# Patient Record
Sex: Female | Born: 1945 | Race: White | Hispanic: No | Smoking: Never smoker
Health system: Southern US, Community
[De-identification: ages and names within clinical notes are randomized; demographics above are authoritative.]

## PROBLEM LIST (undated history)

## (undated) DIAGNOSIS — Z9889 Other specified postprocedural states: Secondary | ICD-10-CM

## (undated) DIAGNOSIS — K228 Other specified diseases of esophagus: Secondary | ICD-10-CM

## (undated) DIAGNOSIS — M797 Fibromyalgia: Secondary | ICD-10-CM

## (undated) DIAGNOSIS — R05 Cough: Secondary | ICD-10-CM

## (undated) DIAGNOSIS — R011 Cardiac murmur, unspecified: Secondary | ICD-10-CM

## (undated) DIAGNOSIS — K219 Gastro-esophageal reflux disease without esophagitis: Secondary | ICD-10-CM

## (undated) DIAGNOSIS — R112 Nausea with vomiting, unspecified: Secondary | ICD-10-CM

## (undated) DIAGNOSIS — M1990 Unspecified osteoarthritis, unspecified site: Secondary | ICD-10-CM

## (undated) DIAGNOSIS — K2289 Other specified disease of esophagus: Secondary | ICD-10-CM

## (undated) DIAGNOSIS — R059 Cough, unspecified: Secondary | ICD-10-CM

## (undated) DIAGNOSIS — T7840XA Allergy, unspecified, initial encounter: Secondary | ICD-10-CM

## (undated) DIAGNOSIS — R51 Headache: Secondary | ICD-10-CM

## (undated) DIAGNOSIS — I10 Essential (primary) hypertension: Secondary | ICD-10-CM

## (undated) DIAGNOSIS — F32A Depression, unspecified: Secondary | ICD-10-CM

## (undated) DIAGNOSIS — F329 Major depressive disorder, single episode, unspecified: Secondary | ICD-10-CM

## (undated) DIAGNOSIS — F419 Anxiety disorder, unspecified: Secondary | ICD-10-CM

## (undated) HISTORY — PX: TUBAL LIGATION: SHX77

## (undated) HISTORY — PX: COLONOSCOPY: SHX174

## (undated) HISTORY — PX: ABDOMINAL HYSTERECTOMY: SHX81

## (undated) HISTORY — PX: EYE SURGERY: SHX253

## (undated) HISTORY — PX: DIAGNOSTIC LAPAROSCOPY: SUR761

## (undated) HISTORY — PX: CHOLECYSTECTOMY: SHX55

---

## 2011-06-22 DIAGNOSIS — K219 Gastro-esophageal reflux disease without esophagitis: Secondary | ICD-10-CM | POA: Insufficient documentation

## 2011-06-22 DIAGNOSIS — K222 Esophageal obstruction: Secondary | ICD-10-CM | POA: Insufficient documentation

## 2011-09-02 DIAGNOSIS — R1310 Dysphagia, unspecified: Secondary | ICD-10-CM | POA: Insufficient documentation

## 2012-07-05 DIAGNOSIS — M5412 Radiculopathy, cervical region: Secondary | ICD-10-CM | POA: Insufficient documentation

## 2012-07-05 DIAGNOSIS — M79605 Pain in left leg: Secondary | ICD-10-CM | POA: Insufficient documentation

## 2012-07-05 DIAGNOSIS — M5416 Radiculopathy, lumbar region: Secondary | ICD-10-CM | POA: Insufficient documentation

## 2012-10-27 ENCOUNTER — Other Ambulatory Visit: Payer: Self-pay | Admitting: Neurosurgery

## 2012-10-27 DIAGNOSIS — G959 Disease of spinal cord, unspecified: Secondary | ICD-10-CM

## 2012-11-04 ENCOUNTER — Ambulatory Visit
Admission: RE | Admit: 2012-11-04 | Discharge: 2012-11-04 | Disposition: A | Discharge status: Home / Self Care | Payer: Medicare Other | Source: Ambulatory Visit | Attending: Neurosurgery | Admitting: Neurosurgery

## 2012-11-04 DIAGNOSIS — G959 Disease of spinal cord, unspecified: Secondary | ICD-10-CM

## 2012-11-28 ENCOUNTER — Other Ambulatory Visit: Payer: Self-pay | Admitting: Neurosurgery

## 2012-12-12 ENCOUNTER — Encounter (HOSPITAL_COMMUNITY): Payer: Self-pay | Admitting: Pharmacy Technician

## 2012-12-14 ENCOUNTER — Ambulatory Visit (HOSPITAL_COMMUNITY)
Admission: RE | Admit: 2012-12-14 | Discharge: 2012-12-14 | Disposition: A | Discharge status: Home / Self Care | Payer: Medicare Other | Source: Ambulatory Visit | Attending: Anesthesiology | Admitting: Anesthesiology

## 2012-12-14 ENCOUNTER — Encounter (HOSPITAL_COMMUNITY): Payer: Self-pay

## 2012-12-14 ENCOUNTER — Encounter (HOSPITAL_COMMUNITY)
Admission: RE | Admit: 2012-12-14 | Discharge: 2012-12-14 | Disposition: A | Discharge status: Home / Self Care | Payer: Medicare Other | Source: Ambulatory Visit | Attending: Neurosurgery | Admitting: Neurosurgery

## 2012-12-14 DIAGNOSIS — Z01812 Encounter for preprocedural laboratory examination: Secondary | ICD-10-CM | POA: Insufficient documentation

## 2012-12-14 DIAGNOSIS — I10 Essential (primary) hypertension: Secondary | ICD-10-CM | POA: Insufficient documentation

## 2012-12-14 DIAGNOSIS — Z01818 Encounter for other preprocedural examination: Secondary | ICD-10-CM | POA: Insufficient documentation

## 2012-12-14 DIAGNOSIS — IMO0001 Reserved for inherently not codable concepts without codable children: Secondary | ICD-10-CM | POA: Insufficient documentation

## 2012-12-14 HISTORY — DX: Cough, unspecified: R05.9

## 2012-12-14 HISTORY — DX: Fibromyalgia: M79.7

## 2012-12-14 HISTORY — DX: Essential (primary) hypertension: I10

## 2012-12-14 HISTORY — DX: Anxiety disorder, unspecified: F41.9

## 2012-12-14 HISTORY — DX: Depression, unspecified: F32.A

## 2012-12-14 HISTORY — DX: Nausea with vomiting, unspecified: R11.2

## 2012-12-14 HISTORY — DX: Gastro-esophageal reflux disease without esophagitis: K21.9

## 2012-12-14 HISTORY — DX: Major depressive disorder, single episode, unspecified: F32.9

## 2012-12-14 HISTORY — DX: Cough: R05

## 2012-12-14 HISTORY — DX: Other specified postprocedural states: Z98.890

## 2012-12-14 HISTORY — DX: Unspecified osteoarthritis, unspecified site: M19.90

## 2012-12-14 HISTORY — DX: Headache: R51

## 2012-12-14 LAB — BASIC METABOLIC PANEL
BUN: 13 mg/dL (ref 6–23)
CO2: 30 mEq/L (ref 19–32)
Calcium: 9.1 mg/dL (ref 8.4–10.5)
Chloride: 98 mEq/L (ref 96–112)
Creatinine, Ser: 0.72 mg/dL (ref 0.50–1.10)
GFR calc Af Amer: >90 mL/min (ref >90)
GFR calc non Af Amer: 87 mL/min — ABNORMAL LOW (ref >90)
Glucose, Bld: 110 mg/dL — ABNORMAL HIGH (ref 70–99)
Potassium: 3.7 mEq/L (ref 3.5–5.1)
Sodium: 136 mEq/L (ref 135–145)

## 2012-12-14 LAB — TYPE AND SCREEN
ABO/RH(D): AB POS
Antibody Screen: NEGATIVE

## 2012-12-14 LAB — CBC
HCT: 38.1 % (ref 36.0–46.0)
Hemoglobin: 13.4 g/dL (ref 12.0–15.0)
MCH: 30.7 pg (ref 26.0–34.0)
MCHC: 35.2 g/dL (ref 30.0–36.0)
MCV: 87.2 fL (ref 78.0–100.0)
Platelets: 215 10*3/uL (ref 150–400)
RBC: 4.37 MIL/uL (ref 3.87–5.11)
RDW: 12.6 % (ref 11.5–15.5)
WBC: 6.2 10*3/uL (ref 4.0–10.5)

## 2012-12-14 LAB — SURGICAL PCR SCREEN
MRSA, PCR: POSITIVE — AB
Staphylococcus aureus: POSITIVE — AB

## 2012-12-14 LAB — ABO/RH: ABO/RH(D): AB POS

## 2012-12-14 NOTE — Pre-Procedure Instructions (Signed)
Lorielle Boehning Herne  12/14/2012   Your procedure is scheduled on:  Thursday December 11 th at  1000 AM  Report to [facility] Main Entrance "A" at 0530 AM.  Call this number if you have problems the morning of surgery: [phone]   Remember:   Do not eat food or drink liquids after midnight.   Take these medicines the morning of surgery with A SIP OF WATER: Alprazolam, Oxycodone-Acetaminophen if needed for pain . Stop all vitamins, Nsaids and herbal medication.   Do not wear jewelry, make-up or nail polish.  Do not wear lotions, powders, or perfumes. You may wear deodorant.  Do not shave 48 hours prior to surgery.   Do not bring valuables to the hospital.  [facility] is not responsible for any belongings or valuables.               Contacts, dentures or bridgework may not be worn into surgery.  Leave suitcase in the car. After surgery it may be brought to your room.  For patients admitted to the hospital, discharge time is determined by your   treatment team.               Patients discharged the day of surgery will not be allowed to drive home.  Name and phone number of your driver: Friend  Special Instructions: Shower using CHG 2 nights before surgery and the night before surgery.  If you shower the day of surgery use CHG.  Use special wash - you have one bottle of CHG for all showers.  You should use approximately 1/3 of the bottle for each shower.   Please read over the following fact sheets that you were given: Pain Booklet, Coughing and Deep Breathing, Blood Transfusion Information, MRSA Information and Surgical Site Infection Prevention

## 2012-12-15 ENCOUNTER — Other Ambulatory Visit (HOSPITAL_COMMUNITY): Payer: Self-pay | Admitting: Neurosurgery

## 2012-12-15 DIAGNOSIS — R911 Solitary pulmonary nodule: Secondary | ICD-10-CM

## 2012-12-15 NOTE — Progress Notes (Addendum)
Anesthesia chart review:  Patient is a 67 year old female scheduled for L3-4, L4-5 laminectomies with PLIF on 12/21/12 by Dr. Lovell Sheehan.  History includes non-smoker, HTN, anxiety, depression, GERD, headaches, cough, arthritis, fibromyalgia, hysterectomy, cholecystectomy, history of vertigo after anesthesia, post-operative N/V.  PCP is listed as Dr. Deforest Hoyles.  EKG on 12/14/12 showed NSR, possible LAE, septal infarct (age undetermined), non-specific ST/T wave abnormality.  Currently, there is no comparison EKG available.  However, she did have a normal stress echo at [facility] on 08/06/11 showing no evidence of ischemia, normal resting LV systolic function, normal BP response to exercise.  CXR on 12/14/12 showed: 1. No acute cardiopulmonary disease.  2. Indeterminate approximately 0.9 cm nodular opacity overlies the left lower lung at while possibly associated with the anterior aspect of the left 7th rib, a discrete underlying pulmonary nodule is not excluded. Correlation with prior examinations (if available) is recommended. Otherwise, further evaluation with chest CT could be performed as clinically indicated. Dr. Lovell Sheehan is aware and has scheduled her to have a chest CT on 12/18/12.  Preoperative labs noted.  If chest CT report does not show anything that needs immediate attention then I would anticipate that she could proceed with surgery as planned.  Velna Ochs [facility] Short Stay Center/Anesthesiology Phone [phone] 12/15/2012 4:49 PM  Addendum: 12/18/2012 4:40 PM Chest CT today showed no evidence of pulmonary neoplasm or other active disease.

## 2012-12-18 ENCOUNTER — Encounter (HOSPITAL_COMMUNITY): Payer: Self-pay

## 2012-12-18 ENCOUNTER — Ambulatory Visit (HOSPITAL_COMMUNITY)
Admission: RE | Admit: 2012-12-18 | Discharge: 2012-12-18 | Disposition: A | Discharge status: Home / Self Care | Payer: Medicare Other | Source: Ambulatory Visit | Attending: Neurosurgery | Admitting: Neurosurgery

## 2012-12-18 DIAGNOSIS — R911 Solitary pulmonary nodule: Secondary | ICD-10-CM | POA: Insufficient documentation

## 2012-12-18 MED ORDER — IOHEXOL 300 MG/ML  SOLN
100.0000 mL | Freq: Once | INTRAMUSCULAR | Status: AC | PRN
  Administered 2012-12-18: 100 mL via INTRAVENOUS

## 2012-12-20 MED ORDER — CEFAZOLIN SODIUM-DEXTROSE 2-3 GM-% IV SOLR
2.0000 g | INTRAVENOUS | Status: DC
  Filled 2012-12-20: qty 50

## 2012-12-21 ENCOUNTER — Encounter (HOSPITAL_COMMUNITY)
Admission: RE | Disposition: A | Discharge status: Home / Self Care | Payer: Medicare Other | Source: Ambulatory Visit | Attending: Neurosurgery

## 2012-12-21 ENCOUNTER — Inpatient Hospital Stay (HOSPITAL_COMMUNITY)
Admission: RE | Admit: 2012-12-21 | Discharge: 2012-12-25 | DRG: 460 | Disposition: A | Discharge status: Home / Self Care | Payer: Medicare Other | Source: Ambulatory Visit | Attending: Neurosurgery | Admitting: Neurosurgery

## 2012-12-21 ENCOUNTER — Inpatient Hospital Stay (HOSPITAL_COMMUNITY): Payer: Medicare Other | Admitting: Anesthesiology

## 2012-12-21 ENCOUNTER — Inpatient Hospital Stay (HOSPITAL_COMMUNITY): Payer: Medicare Other

## 2012-12-21 ENCOUNTER — Encounter (HOSPITAL_COMMUNITY): Payer: Self-pay | Admitting: *Deleted

## 2012-12-21 ENCOUNTER — Encounter (HOSPITAL_COMMUNITY): Payer: Medicare Other | Admitting: Vascular Surgery

## 2012-12-21 DIAGNOSIS — M48062 Spinal stenosis, lumbar region with neurogenic claudication: Secondary | ICD-10-CM | POA: Diagnosis present

## 2012-12-21 DIAGNOSIS — F411 Generalized anxiety disorder: Secondary | ICD-10-CM | POA: Diagnosis present

## 2012-12-21 DIAGNOSIS — Z881 Allergy status to other antibiotic agents status: Secondary | ICD-10-CM

## 2012-12-21 DIAGNOSIS — I10 Essential (primary) hypertension: Secondary | ICD-10-CM | POA: Diagnosis present

## 2012-12-21 DIAGNOSIS — Z79899 Other long term (current) drug therapy: Secondary | ICD-10-CM

## 2012-12-21 DIAGNOSIS — M4316 Spondylolisthesis, lumbar region: Secondary | ICD-10-CM

## 2012-12-21 DIAGNOSIS — K219 Gastro-esophageal reflux disease without esophagitis: Secondary | ICD-10-CM | POA: Diagnosis present

## 2012-12-21 DIAGNOSIS — M4310 Spondylolisthesis, site unspecified: Secondary | ICD-10-CM | POA: Diagnosis present

## 2012-12-21 DIAGNOSIS — Z9089 Acquired absence of other organs: Secondary | ICD-10-CM

## 2012-12-21 DIAGNOSIS — M5137 Other intervertebral disc degeneration, lumbosacral region: Secondary | ICD-10-CM | POA: Diagnosis present

## 2012-12-21 DIAGNOSIS — Z888 Allergy status to other drugs, medicaments and biological substances status: Secondary | ICD-10-CM

## 2012-12-21 DIAGNOSIS — M51379 Other intervertebral disc degeneration, lumbosacral region without mention of lumbar back pain or lower extremity pain: Secondary | ICD-10-CM | POA: Diagnosis present

## 2012-12-21 DIAGNOSIS — Q762 Congenital spondylolisthesis: Principal | ICD-10-CM

## 2012-12-21 DIAGNOSIS — F3289 Other specified depressive episodes: Secondary | ICD-10-CM | POA: Diagnosis present

## 2012-12-21 DIAGNOSIS — F329 Major depressive disorder, single episode, unspecified: Secondary | ICD-10-CM | POA: Diagnosis present

## 2012-12-21 HISTORY — PX: BACK SURGERY: SHX140

## 2012-12-21 SURGERY — POSTERIOR LUMBAR FUSION 2 LEVEL
Anesthesia: General | Site: Spine Lumbar

## 2012-12-21 MED ORDER — DIAZEPAM 5 MG PO TABS
ORAL_TABLET | ORAL | Status: AC
  Filled 2012-12-21: qty 1

## 2012-12-21 MED ORDER — HYDROCODONE-ACETAMINOPHEN 5-325 MG PO TABS
1.0000 | ORAL_TABLET | ORAL | Status: DC | PRN
  Administered 2012-12-22 – 2012-12-25 (×2): 2 via ORAL
  Filled 2012-12-21 (×2): qty 2

## 2012-12-21 MED ORDER — ARTIFICIAL TEARS OP OINT
TOPICAL_OINTMENT | OPHTHALMIC | Status: DC | PRN
  Administered 2012-12-21: 1 via OPHTHALMIC

## 2012-12-21 MED ORDER — DIAZEPAM 5 MG PO TABS
5.0000 mg | ORAL_TABLET | Freq: Four times a day (QID) | ORAL | Status: DC | PRN
  Administered 2012-12-21 – 2012-12-24 (×7): 5 mg via ORAL
  Filled 2012-12-21 (×8): qty 1

## 2012-12-21 MED ORDER — PHENOL 1.4 % MT LIQD: 1.0000 | OROMUCOSAL | Status: DC | PRN

## 2012-12-21 MED ORDER — HYDROMORPHONE HCL PF 1 MG/ML IJ SOLN
INTRAMUSCULAR | Status: AC
  Filled 2012-12-21: qty 1

## 2012-12-21 MED ORDER — 0.9 % SODIUM CHLORIDE (POUR BTL) OPTIME
TOPICAL | Status: DC | PRN
  Administered 2012-12-21: 1000 mL

## 2012-12-21 MED ORDER — NALOXONE HCL 0.4 MG/ML IJ SOLN: 0.4000 mg | INTRAMUSCULAR | Status: DC | PRN

## 2012-12-21 MED ORDER — OXYCODONE HCL 5 MG PO TABS
2.5000 mg | ORAL_TABLET | ORAL | Status: DC | PRN
  Administered 2012-12-22 – 2012-12-25 (×9): 5 mg via ORAL
  Filled 2012-12-21 (×9): qty 1

## 2012-12-21 MED ORDER — DEXAMETHASONE SODIUM PHOSPHATE 4 MG/ML IJ SOLN
INTRAMUSCULAR | Status: DC | PRN
  Administered 2012-12-21: 8 mg via INTRAVENOUS

## 2012-12-21 MED ORDER — ONDANSETRON HCL 4 MG/2ML IJ SOLN
INTRAMUSCULAR | Status: DC | PRN
  Administered 2012-12-21 (×2): 4 mg via INTRAVENOUS

## 2012-12-21 MED ORDER — BUPIVACAINE-EPINEPHRINE PF 0.5-1:200000 % IJ SOLN
INTRAMUSCULAR | Status: DC | PRN
  Administered 2012-12-21: 10 mL via PERINEURAL

## 2012-12-21 MED ORDER — NEOSTIGMINE METHYLSULFATE 1 MG/ML IJ SOLN
INTRAMUSCULAR | Status: DC | PRN
  Administered 2012-12-21: 3 mg via INTRAVENOUS

## 2012-12-21 MED ORDER — LACTATED RINGERS IV SOLN
INTRAVENOUS | Status: DC
  Administered 2012-12-21: 07:00:00 via INTRAVENOUS

## 2012-12-21 MED ORDER — ESCITALOPRAM OXALATE 20 MG PO TABS
20.0000 mg | ORAL_TABLET | Freq: Every day | ORAL | Status: DC
  Administered 2012-12-21 – 2012-12-25 (×5): 20 mg via ORAL
  Filled 2012-12-21 (×5): qty 1

## 2012-12-21 MED ORDER — EPHEDRINE SULFATE 50 MG/ML IJ SOLN
INTRAMUSCULAR | Status: DC | PRN
  Administered 2012-12-21 (×2): 10 mg via INTRAVENOUS
  Administered 2012-12-21 (×2): 5 mg via INTRAVENOUS
  Administered 2012-12-21: 10 mg via INTRAVENOUS
  Administered 2012-12-21 (×2): 5 mg via INTRAVENOUS

## 2012-12-21 MED ORDER — SODIUM CHLORIDE 0.9 % IJ SOLN: 9.0000 mL | INTRAMUSCULAR | Status: DC | PRN

## 2012-12-21 MED ORDER — OXYCODONE-ACETAMINOPHEN 10-325 MG PO TABS: 0.5000 | ORAL_TABLET | ORAL | Status: DC | PRN

## 2012-12-21 MED ORDER — CHLORHEXIDINE GLUCONATE CLOTH 2 % EX PADS
6.0000 | MEDICATED_PAD | Freq: Every day | CUTANEOUS | Status: DC
  Administered 2012-12-22 – 2012-12-25 (×2): 6 via TOPICAL

## 2012-12-21 MED ORDER — ALBUMIN HUMAN 5 % IV SOLN
INTRAVENOUS | Status: DC | PRN
  Administered 2012-12-21: 12:00:00 via INTRAVENOUS

## 2012-12-21 MED ORDER — ACETAMINOPHEN 650 MG RE SUPP: 650.0000 mg | RECTAL | Status: DC | PRN

## 2012-12-21 MED ORDER — MORPHINE SULFATE 2 MG/ML IJ SOLN: 1.0000 mg | INTRAMUSCULAR | Status: DC | PRN

## 2012-12-21 MED ORDER — DIPHENHYDRAMINE HCL 50 MG/ML IJ SOLN: 12.5000 mg | Freq: Four times a day (QID) | INTRAMUSCULAR | Status: DC | PRN

## 2012-12-21 MED ORDER — ONDANSETRON HCL 4 MG/2ML IJ SOLN
4.0000 mg | Freq: Four times a day (QID) | INTRAMUSCULAR | Status: DC | PRN
  Administered 2012-12-21: 4 mg via INTRAVENOUS
  Filled 2012-12-21: qty 2

## 2012-12-21 MED ORDER — LACTATED RINGERS IV SOLN
INTRAVENOUS | Status: DC | PRN
  Administered 2012-12-21 (×3): via INTRAVENOUS

## 2012-12-21 MED ORDER — MULTI-VITAMIN/MINERALS PO TABS: 1.0000 | ORAL_TABLET | Freq: Every day | ORAL | Status: DC

## 2012-12-21 MED ORDER — BACITRACIN ZINC 500 UNIT/GM EX OINT
TOPICAL_OINTMENT | CUTANEOUS | Status: DC | PRN
  Administered 2012-12-21: 1 via TOPICAL

## 2012-12-21 MED ORDER — ALUM & MAG HYDROXIDE-SIMETH 200-200-20 MG/5ML PO SUSP: 30.0000 mL | Freq: Four times a day (QID) | ORAL | Status: DC | PRN

## 2012-12-21 MED ORDER — VANCOMYCIN HCL IN DEXTROSE 750-5 MG/150ML-% IV SOLN
750.0000 mg | INTRAVENOUS | Status: DC
  Administered 2012-12-22 – 2012-12-24 (×3): 750 mg via INTRAVENOUS
  Filled 2012-12-21 (×3): qty 150

## 2012-12-21 MED ORDER — VANCOMYCIN HCL IN DEXTROSE 1-5 GM/200ML-% IV SOLN: 1000.0000 mg | Freq: Once | INTRAVENOUS | Status: DC

## 2012-12-21 MED ORDER — DOCUSATE SODIUM 100 MG PO CAPS
100.0000 mg | ORAL_CAPSULE | Freq: Two times a day (BID) | ORAL | Status: DC
  Administered 2012-12-21 – 2012-12-25 (×8): 100 mg via ORAL
  Filled 2012-12-21 (×7): qty 1

## 2012-12-21 MED ORDER — LIDOCAINE HCL (CARDIAC) 20 MG/ML IV SOLN
INTRAVENOUS | Status: DC | PRN
  Administered 2012-12-21: 50 mg via INTRAVENOUS

## 2012-12-21 MED ORDER — OXYBUTYNIN CHLORIDE 5 MG PO TABS
10.0000 mg | ORAL_TABLET | Freq: Every day | ORAL | Status: DC
  Administered 2012-12-21 – 2012-12-25 (×5): 10 mg via ORAL
  Filled 2012-12-21 (×5): qty 2

## 2012-12-21 MED ORDER — LOSARTAN POTASSIUM 50 MG PO TABS
100.0000 mg | ORAL_TABLET | Freq: Every day | ORAL | Status: DC
  Administered 2012-12-21 – 2012-12-25 (×4): 100 mg via ORAL
  Filled 2012-12-21 (×5): qty 2

## 2012-12-21 MED ORDER — PROPOFOL 10 MG/ML IV BOLUS
INTRAVENOUS | Status: DC | PRN
  Administered 2012-12-21: 150 mg via INTRAVENOUS

## 2012-12-21 MED ORDER — ACETAMINOPHEN 325 MG PO TABS: 650.0000 mg | ORAL_TABLET | ORAL | Status: DC | PRN

## 2012-12-21 MED ORDER — OXYCODONE-ACETAMINOPHEN 5-325 MG PO TABS
1.0000 | ORAL_TABLET | ORAL | Status: DC | PRN
  Administered 2012-12-24 – 2012-12-25 (×4): 1 via ORAL
  Filled 2012-12-21 (×5): qty 1

## 2012-12-21 MED ORDER — ADULT MULTIVITAMIN W/MINERALS CH
1.0000 | ORAL_TABLET | Freq: Every day | ORAL | Status: DC
  Administered 2012-12-21 – 2012-12-25 (×5): 1 via ORAL
  Filled 2012-12-21 (×5): qty 1

## 2012-12-21 MED ORDER — GLYCOPYRROLATE 0.2 MG/ML IJ SOLN
INTRAMUSCULAR | Status: DC | PRN
  Administered 2012-12-21: 0.4 mg via INTRAVENOUS

## 2012-12-21 MED ORDER — MENTHOL 3 MG MT LOZG
1.0000 | LOZENGE | OROMUCOSAL | Status: DC | PRN
  Filled 2012-12-21 (×2): qty 9

## 2012-12-21 MED ORDER — ONDANSETRON HCL 4 MG/2ML IJ SOLN: 4.0000 mg | INTRAMUSCULAR | Status: DC | PRN

## 2012-12-21 MED ORDER — MORPHINE SULFATE (PF) 1 MG/ML IV SOLN
INTRAVENOUS | Status: DC
  Administered 2012-12-21: 9 mg via INTRAVENOUS
  Administered 2012-12-21: 4.5 mg via INTRAVENOUS
  Administered 2012-12-21: 16:00:00 via INTRAVENOUS
  Administered 2012-12-22: 17.79 mg via INTRAVENOUS
  Administered 2012-12-22: 25 mg via INTRAVENOUS
  Administered 2012-12-22: 9 mg via INTRAVENOUS
  Filled 2012-12-21: qty 25

## 2012-12-21 MED ORDER — SODIUM CHLORIDE 0.9 % IR SOLN
Status: DC | PRN
  Administered 2012-12-21: 11:00:00

## 2012-12-21 MED ORDER — FENTANYL CITRATE 0.05 MG/ML IJ SOLN
INTRAMUSCULAR | Status: DC | PRN
  Administered 2012-12-21 (×2): 50 ug via INTRAVENOUS
  Administered 2012-12-21 (×2): 25 ug via INTRAVENOUS
  Administered 2012-12-21: 50 ug via INTRAVENOUS
  Administered 2012-12-21 (×2): 25 ug via INTRAVENOUS

## 2012-12-21 MED ORDER — LACTATED RINGERS IV SOLN
INTRAVENOUS | Status: DC
  Administered 2012-12-22: 08:00:00 via INTRAVENOUS

## 2012-12-21 MED ORDER — ATORVASTATIN CALCIUM 20 MG PO TABS: 20.0000 mg | ORAL_TABLET | ORAL | Status: DC

## 2012-12-21 MED ORDER — THROMBIN 20000 UNITS EX SOLR
CUTANEOUS | Status: DC | PRN
  Administered 2012-12-21: 11:00:00 via TOPICAL

## 2012-12-21 MED ORDER — DIPHENHYDRAMINE HCL 12.5 MG/5ML PO ELIX: 12.5000 mg | ORAL_SOLUTION | Freq: Four times a day (QID) | ORAL | Status: DC | PRN

## 2012-12-21 MED ORDER — AMITRIPTYLINE HCL 75 MG PO TABS
75.0000 mg | ORAL_TABLET | Freq: Every day | ORAL | Status: DC
  Administered 2012-12-21 – 2012-12-24 (×4): 75 mg via ORAL
  Filled 2012-12-21 (×7): qty 1

## 2012-12-21 MED ORDER — BUPIVACAINE LIPOSOME 1.3 % IJ SUSP
20.0000 mL | INTRAMUSCULAR | Status: AC
  Administered 2012-12-21: 20 mL
  Filled 2012-12-21: qty 20

## 2012-12-21 MED ORDER — VANCOMYCIN HCL IN DEXTROSE 1-5 GM/200ML-% IV SOLN
INTRAVENOUS | Status: AC
  Administered 2012-12-21: 1000 mg via INTRAVENOUS
  Filled 2012-12-21: qty 200

## 2012-12-21 MED ORDER — PHENYLEPHRINE HCL 10 MG/ML IJ SOLN
10.0000 mg | INTRAVENOUS | Status: DC | PRN
  Administered 2012-12-21: 25 ug/min via INTRAVENOUS

## 2012-12-21 MED ORDER — SODIUM CHLORIDE 0.9 % IV SOLN
INTRAVENOUS | Status: DC | PRN
  Administered 2012-12-21: 15:00:00 via INTRAVENOUS

## 2012-12-21 MED ORDER — MIDAZOLAM HCL 5 MG/5ML IJ SOLN
INTRAMUSCULAR | Status: DC | PRN
  Administered 2012-12-21: 1 mg via INTRAVENOUS

## 2012-12-21 MED ORDER — ROCURONIUM BROMIDE 100 MG/10ML IV SOLN
INTRAVENOUS | Status: DC | PRN
  Administered 2012-12-21: 40 mg via INTRAVENOUS
  Administered 2012-12-21 (×4): 10 mg via INTRAVENOUS

## 2012-12-21 MED ORDER — HYDROMORPHONE HCL PF 1 MG/ML IJ SOLN
0.2500 mg | INTRAMUSCULAR | Status: DC | PRN
  Administered 2012-12-21 (×2): 0.5 mg via INTRAVENOUS

## 2012-12-21 MED ORDER — MORPHINE SULFATE (PF) 1 MG/ML IV SOLN
INTRAVENOUS | Status: AC
Start: 2012-12-21 — End: 2012-12-22
  Filled 2012-12-21: qty 25

## 2012-12-21 MED ORDER — PHENYLEPHRINE HCL 10 MG/ML IJ SOLN
INTRAMUSCULAR | Status: DC | PRN
  Administered 2012-12-21 (×3): 80 ug via INTRAVENOUS

## 2012-12-21 SURGICAL SUPPLY — 73 items
BAG DECANTER FOR FLEXI CONT (MISCELLANEOUS) ×2 IMPLANT
BENZOIN TINCTURE PRP APPL 2/3 (GAUZE/BANDAGES/DRESSINGS) ×2 IMPLANT
BLADE SURG ROTATE 9660 (MISCELLANEOUS) IMPLANT
BRUSH SCRUB EZ PLAIN DRY (MISCELLANEOUS) ×2 IMPLANT
BUR ACORN 6.0 (BURR) ×2 IMPLANT
BUR MATCHSTICK NEURO 3.0 LAGG (BURR) ×2 IMPLANT
CANISTER SUCT 3000ML (MISCELLANEOUS) ×2 IMPLANT
CAP REVERE LOCKING (Cap) ×12 IMPLANT
CONT SPEC 4OZ CLIKSEAL STRL BL (MISCELLANEOUS) ×2 IMPLANT
COVER BACK TABLE 24X17X13 BIG (DRAPES) IMPLANT
COVER TABLE BACK 60X90 (DRAPES) ×2 IMPLANT
DRAPE C-ARM 42X72 X-RAY (DRAPES) ×4 IMPLANT
DRAPE LAPAROTOMY 100X72X124 (DRAPES) ×2 IMPLANT
DRAPE POUCH INSTRU U-SHP 10X18 (DRAPES) ×2 IMPLANT
DRAPE PROXIMA HALF (DRAPES) ×2 IMPLANT
DRAPE SURG 17X23 STRL (DRAPES) ×8 IMPLANT
ELECT BLADE 4.0 EZ CLEAN MEGAD (MISCELLANEOUS) ×2
ELECT REM PT RETURN 9FT ADLT (ELECTROSURGICAL) ×2
ELECTRODE BLDE 4.0 EZ CLN MEGD (MISCELLANEOUS) ×1 IMPLANT
ELECTRODE REM PT RTRN 9FT ADLT (ELECTROSURGICAL) ×1 IMPLANT
GAUZE SPONGE 4X4 16PLY XRAY LF (GAUZE/BANDAGES/DRESSINGS) IMPLANT
GLOVE BIO SURGEON STRL SZ8 (GLOVE) ×2 IMPLANT
GLOVE BIO SURGEON STRL SZ8.5 (GLOVE) ×4 IMPLANT
GLOVE BIOGEL PI IND STRL 7.0 (GLOVE) ×1 IMPLANT
GLOVE BIOGEL PI IND STRL 7.5 (GLOVE) ×3 IMPLANT
GLOVE BIOGEL PI IND STRL 8.5 (GLOVE) ×1 IMPLANT
GLOVE BIOGEL PI INDICATOR 7.0 (GLOVE) ×1
GLOVE BIOGEL PI INDICATOR 7.5 (GLOVE) ×3
GLOVE BIOGEL PI INDICATOR 8.5 (GLOVE) ×1
GLOVE ECLIPSE 7.5 STRL STRAW (GLOVE) ×4 IMPLANT
GLOVE EXAM NITRILE LRG STRL (GLOVE) IMPLANT
GLOVE EXAM NITRILE MD LF STRL (GLOVE) IMPLANT
GLOVE EXAM NITRILE XL STR (GLOVE) IMPLANT
GLOVE EXAM NITRILE XS STR PU (GLOVE) IMPLANT
GLOVE SS BIOGEL STRL SZ 8 (GLOVE) ×2 IMPLANT
GLOVE SUPERSENSE BIOGEL SZ 8 (GLOVE) ×2
GLOVE SURG SS PI 7.0 STRL IVOR (GLOVE) ×6 IMPLANT
GOWN BRE IMP SLV AUR LG STRL (GOWN DISPOSABLE) ×2 IMPLANT
GOWN BRE IMP SLV AUR XL STRL (GOWN DISPOSABLE) ×10 IMPLANT
GOWN STRL REIN 2XL LVL4 (GOWN DISPOSABLE) IMPLANT
KIT BASIN OR (CUSTOM PROCEDURE TRAY) ×2 IMPLANT
KIT ROOM TURNOVER OR (KITS) ×2 IMPLANT
NEEDLE HYPO 21X1.5 SAFETY (NEEDLE) IMPLANT
NEEDLE HYPO 22GX1.5 SAFETY (NEEDLE) ×2 IMPLANT
NS IRRIG 1000ML POUR BTL (IV SOLUTION) ×2 IMPLANT
PACK FOAM VITOSS 10CC (Orthopedic Implant) ×2 IMPLANT
PACK LAMINECTOMY NEURO (CUSTOM PROCEDURE TRAY) ×2 IMPLANT
PAD ARMBOARD 7.5X6 YLW CONV (MISCELLANEOUS) ×10 IMPLANT
PATTIES SURGICAL .5 X.5 (GAUZE/BANDAGES/DRESSINGS) ×2 IMPLANT
PATTIES SURGICAL .5 X1 (DISPOSABLE) IMPLANT
PATTIES SURGICAL 1X1 (DISPOSABLE) ×2 IMPLANT
PUTTY 10ML ACTIFUSE ABX (Putty) ×4 IMPLANT
ROD REVERE CURVED 65MM (Rod) ×4 IMPLANT
SCREW REVERE 6.35 6.5MMX45 (Screw) ×12 IMPLANT
SCREW REVERE 6.35 6.5X35MM (Screw) ×4 IMPLANT
SPACER SUSTAIN O 10X10X26 (Screw) ×4 IMPLANT
SPACER SUSTAIN O SM1 10X22 10M (Spacer) ×4 IMPLANT
SPONGE GAUZE 4X4 12PLY (GAUZE/BANDAGES/DRESSINGS) ×2 IMPLANT
SPONGE LAP 4X18 X RAY DECT (DISPOSABLE) IMPLANT
SPONGE NEURO XRAY DETECT 1X3 (DISPOSABLE) IMPLANT
SPONGE SURGIFOAM ABS GEL 100 (HEMOSTASIS) ×2 IMPLANT
STRIP CLOSURE SKIN 1/2X4 (GAUZE/BANDAGES/DRESSINGS) ×2 IMPLANT
SUT VIC AB 1 CT1 18XBRD ANBCTR (SUTURE) ×3 IMPLANT
SUT VIC AB 1 CT1 8-18 (SUTURE) ×3
SUT VIC AB 2-0 CP2 18 (SUTURE) ×4 IMPLANT
SYR 20CC LL (SYRINGE) ×2 IMPLANT
SYR 20ML ECCENTRIC (SYRINGE) ×2 IMPLANT
TAPE CLOTH SURG 4X10 WHT LF (GAUZE/BANDAGES/DRESSINGS) ×2 IMPLANT
TAPE STRIPS DRAPE STRL (GAUZE/BANDAGES/DRESSINGS) ×2 IMPLANT
TOWEL OR 17X24 6PK STRL BLUE (TOWEL DISPOSABLE) ×2 IMPLANT
TOWEL OR 17X26 10 PK STRL BLUE (TOWEL DISPOSABLE) ×2 IMPLANT
TRAY FOLEY CATH 14FRSI W/METER (CATHETERS) ×2 IMPLANT
WATER STERILE IRR 1000ML POUR (IV SOLUTION) ×2 IMPLANT

## 2012-12-21 NOTE — Progress Notes (Signed)
ANTIBIOTIC CONSULT NOTE - INITIAL  Pharmacy Consult for Vancomycin Indication: surgical prophylaxis s/p lumbar laminectomies/fusion (drain placed)  Allergies  Allergen Reactions  . Cephalosporins     Flushing of the face  . Lisinopril     Deep cough  . Amoxicillin Rash    Patient Measurements: Height: 5' (152.4 cm) Weight: 108 lb 3.9 oz (49.1 kg) IBW/kg (Calculated) : 45.5   Vital Signs: Temp: 98.3 F (36.8 C) (12/11 1735) Temp src: Oral (12/11 1735) BP: 138/54 mmHg (12/11 1735) Pulse Rate: 114 (12/11 1735) Intake/Output from previous day:   Intake/Output from this shift: Total I/O In: 3140 [I.V.:2750; Blood:140; IV Piggyback:250] Out: 1450 [Urine:1000; Drains:50; Blood:400]  Labs: No results found for this basename: WBC, HGB, PLT, LABCREA, CREATININE,  in the last 72 hours Estimated Creatinine Clearance: 49 ml/min (by C-G formula based on Cr of 0.72). No results found for this basename: VANCOTROUGH, Leodis Binet, VANCORANDOM, GENTTROUGH, GENTPEAK, GENTRANDOM, TOBRATROUGH, TOBRAPEAK, TOBRARND, AMIKACINPEAK, AMIKACINTROU, AMIKACIN,  in the last 72 hours   Microbiology: Recent Results (from the past 720 hour(s))  SURGICAL PCR SCREEN     Status: Abnormal   Collection Time    12/14/12  2:48 PM      Result Value Range Status   MRSA, PCR POSITIVE (*) NEGATIVE Final   Staphylococcus aureus POSITIVE (*) NEGATIVE Final   Comment:            The Xpert SA Assay (FDA     approved for NASAL specimens     in patients over 24 years of age),     is one component of     a comprehensive surveillance     program.  Test performance has     been validated by [employer] for patients greater     than or equal to 6 year old.     It is not intended     to diagnose infection nor to     guide or monitor treatment.    Medical History: Past Medical History  Diagnosis Date  . PONV (postoperative nausea and vomiting)     hx of Vertigo after anesthesia  . Hypertension   .  Anxiety   . Depression   . Cough     due allergies chronic  . GERD (gastroesophageal reflux disease)   . Headache(784.0)   . Arthritis   . Fibromyalgia     Medications:  Prescriptions prior to admission  Medication Sig Dispense Refill  . ALPRAZolam (XANAX) 0.5 MG tablet Take 0.5 mg by mouth 2 (two) times daily as needed for anxiety.      Marland Kitchen amitriptyline (ELAVIL) 25 MG tablet Take 75 mg by mouth at bedtime.      Marland Kitchen atorvastatin (LIPITOR) 20 MG tablet Take 20 mg by mouth once a week.      . Calcium Carb-Cholecalciferol (CALCIUM 600 + D PO) Take 1 tablet by mouth daily.      . Cholecalciferol (VITAMIN D) 2000 UNITS CAPS Take 2,000 Units by mouth daily.      . COD LIVER OIL PO Take 5 mLs by mouth daily.      Marland Kitchen escitalopram (LEXAPRO) 20 MG tablet Take 20 mg by mouth daily.      . hydrocortisone 2.5 % cream Apply 1 application topically 2 (two) times daily.      Marland Kitchen losartan (COZAAR) 100 MG tablet Take 100 mg by mouth daily.      . magnesium oxide (MAG-OX) 400 MG tablet Take  400 mg by mouth daily.      . Multiple Vitamins-Minerals (MULTIVITAMIN WITH MINERALS) tablet Take 1 tablet by mouth daily.      Marland Kitchen oxybutynin (DITROPAN) 5 MG tablet Take 10 mg by mouth daily.      Marland Kitchen oxyCODONE-acetaminophen (PERCOCET) 10-325 MG per tablet Take 0.5-1 tablets by mouth every 4 (four) hours as needed for pain.      . clobetasol ointment (TEMOVATE) 0.05 % Apply 1 application topically 2 (two) times daily as needed (for rash).       Scheduled:  . diazepam      . docusate sodium  100 mg Oral BID  . HYDROmorphone      . morphine   Intravenous Q4H  . morphine      . vancomycin  750 mg Intravenous Q24H   Assessment: 67 y.o female s/p lumbar laminectomies with fusion today. Received 1000 mg IV vancomycin at ~10:00AM.  Drain placed.  Orders are to continue vancomycin if drain placed.  Patient weighs 49 kg.  Preadmit SCr = 0.72,  CrCl ~ 49 ml/min.  Afebrile, Preadmit WBC 6.2K.  MRSA PCR /staph aureus positive.    Goal of Therapy:  Vancomycin trough level 10-15 mcg/ml  Plan:  Vancomycin 750 mg IV q24h start tomorrow AM.   Noah Delaine, RPh Clinical Pharmacist Pager: [phone] 12/21/2012,6:58 PM

## 2012-12-21 NOTE — Progress Notes (Signed)
Patient arrived to unit from PACU - alert and oriented with PCA morphine. Vitals stable. Patient was oriented to room.

## 2012-12-21 NOTE — H&P (Signed)
Subjective: The patient is a 67 year old white female who has complained of back, hip and leg pain consistent with neurogenic claudication. She has failed medical management and was worked up with a lumbar x-rays a lumbar MRI. This demonstrated the patient had a spondylolisthesis and spinal stenosis at L3-4 and L4-5. I discussed the various treatment option with the patient including surgery. She has weighed the risks, benefits, and alternatives surgery and decided proceed with the operation.   Past Medical History  Diagnosis Date  . PONV (postoperative nausea and vomiting)     hx of Vertigo after anesthesia  . Hypertension   . Anxiety   . Depression   . Cough     due allergies chronic  . GERD (gastroesophageal reflux disease)   . Headache(784.0)   . Arthritis   . Fibromyalgia     Past Surgical History  Procedure Laterality Date  . Abdominal hysterectomy    . Cholecystectomy    . Eye surgery Bilateral     laser  . Colonoscopy      Allergies  Allergen Reactions  . Cephalosporins     Flushing of the face  . Lisinopril     Deep cough  . Amoxicillin Rash    History  Substance Use Topics  . Smoking status: Never Smoker   . Smokeless tobacco: Never Used  . Alcohol Use: No     Comment: social    History reviewed. No pertinent family history. Prior to Admission medications   Medication Sig Start Date End Date Taking? Authorizing Provider  ALPRAZolam Prudy Feeler) 0.5 MG tablet Take 0.5 mg by mouth 2 (two) times daily as needed for anxiety.   Yes Historical Provider, MD  amitriptyline (ELAVIL) 25 MG tablet Take 75 mg by mouth at bedtime.   Yes Historical Provider, MD  atorvastatin (LIPITOR) 20 MG tablet Take 20 mg by mouth once a week.   Yes Historical Provider, MD  Calcium Carb-Cholecalciferol (CALCIUM 600 + D PO) Take 1 tablet by mouth daily.   Yes Historical Provider, MD  Cholecalciferol (VITAMIN D) 2000 UNITS CAPS Take 2,000 Units by mouth daily.   Yes Historical Provider, MD   COD LIVER OIL PO Take 5 mLs by mouth daily.   Yes Historical Provider, MD  escitalopram (LEXAPRO) 20 MG tablet Take 20 mg by mouth daily.   Yes Historical Provider, MD  hydrocortisone 2.5 % cream Apply 1 application topically 2 (two) times daily.   Yes Historical Provider, MD  losartan (COZAAR) 100 MG tablet Take 100 mg by mouth daily.   Yes Historical Provider, MD  magnesium oxide (MAG-OX) 400 MG tablet Take 400 mg by mouth daily.   Yes Historical Provider, MD  Multiple Vitamins-Minerals (MULTIVITAMIN WITH MINERALS) tablet Take 1 tablet by mouth daily.   Yes Historical Provider, MD  oxybutynin (DITROPAN) 5 MG tablet Take 10 mg by mouth daily.   Yes Historical Provider, MD  oxyCODONE-acetaminophen (PERCOCET) 10-325 MG per tablet Take 0.5-1 tablets by mouth every 4 (four) hours as needed for pain.   Yes Historical Provider, MD  clobetasol ointment (TEMOVATE) 0.05 % Apply 1 application topically 2 (two) times daily as needed (for rash).    Historical Provider, MD     Review of Systems  Positive ROS: As above  All other systems have been reviewed and were otherwise negative with the exception of those mentioned in the HPI and as above.  Objective: Vital signs in last 24 hours: Temp:  [97.9 F (36.6 C)] 97.9 F (36.6 C) (  12/11 0631) Pulse Rate:  [69] 69 (12/11 0631) Resp:  [18] 18 (12/11 0631) BP: (117)/(69) 117/69 mmHg (12/11 0631) SpO2:  [100 %] 100 % (12/11 0631)  General Appearance: Alert, cooperative, no distress, appears stated age Head: Normocephalic, without obvious abnormality, atraumatic Eyes: PERRL, conjunctiva/corneas clear, EOM's intact, fundi benign, both eyes      Ears: Normal TM's and external ear canals, both ears Throat: Lips, mucosa, and tongue normal; teeth and gums normal Neck: Supple, symmetrical, trachea midline, no adenopathy; thyroid: No enlargement/tenderness/nodules; no carotid bruit or JVD Back: Symmetric, no curvature, ROM normal, no CVA tenderness Lungs:  Clear to auscultation bilaterally, respirations unlabored Heart: Regular rate and rhythm, S1 and S2 normal, no murmur, rub or gallop Abdomen: Soft, non-tender, bowel sounds active all four quadrants, no masses, no organomegaly Extremities: Extremities normal, atraumatic, no cyanosis or edema Pulses: 2+ and symmetric all extremities Skin: Skin color, texture, turgor normal, no rashes or lesions  NEUROLOGIC:   Mental status: alert and oriented, no aphasia, good attention span, Fund of knowledge/ memory ok Motor Exam - grossly normal Sensory Exam - grossly normal Reflexes:  Coordination - grossly normal Gait - grossly normal Balance - grossly normal Cranial Nerves: I: smell Not tested  II: visual acuity  OS: Normal    OD: Normal   II: visual fields Full to confrontation  II: pupils Equal, round, reactive to light  III,VII: ptosis None  III,IV,VI: extraocular muscles  Full ROM  V: mastication Normal  V: facial light touch sensation  Normal  V,VII: corneal reflex  Present  VII: facial muscle function - upper  Normal  VII: facial muscle function - lower Normal  VIII: hearing Not tested  IX: soft palate elevation  Normal  IX,X: gag reflex Present  XI: trapezius strength  5/5  XI: sternocleidomastoid strength 5/5  XI: neck flexion strength  5/5  XII: tongue strength  Normal    Data Review Lab Results  Component Value Date   WBC 6.2 12/14/2012   HGB 13.4 12/14/2012   HCT 38.1 12/14/2012   MCV 87.2 12/14/2012   PLT 215 12/14/2012   Lab Results  Component Value Date   NA 136 12/14/2012   K 3.7 12/14/2012   CL 98 12/14/2012   CO2 30 12/14/2012   BUN 13 12/14/2012   CREATININE 0.72 12/14/2012   GLUCOSE 110* 12/14/2012   No results found for this basename: INR, PROTIME    Assessment/Plan: L3-4 and L4-5 spondylolisthesis, spinal stenosis, lumbago, lumbar radiculopathy, neurogenic claudication: I have discussed the situation with the patient. I reviewed her imaging studies with her  and pointed out the abnormalities. We have discussed the various treatment option including surgery. I have described the surgical treatment option of an L3-4 and L4-5 decompression, instrumentation, and fusion. I've shown her surgical models. We have discussed the risks, benefits, alternatives, and likelihood of achieving our goals with surgery. I have answered all the patient's questions.. She wants to proceed with surgery.   Aliviya Schoeller D 12/21/2012 9:37 AM

## 2012-12-21 NOTE — Transfer of Care (Signed)
Immediate Anesthesia Transfer of Care Note  Patient: Carrie Austin  Procedure(s) Performed: Procedure(s): LUMBAR THREE-FOUR,LUMBAR FOUR-FIVE LAMINECTOMIES WITH POSTERIOR LUMBAR INTERBODY FUSION WITH INTERBODY PROSTHESIS.POSTERIOR LATERAL ARTHRODESIS AND POSTERIOR SEGMENTAL INSTRUMENTATION (N/A)  Patient Location: PACU  Anesthesia Type:General  Level of Consciousness: awake, alert  and oriented  Airway & Oxygen Therapy: Patient Spontanous Breathing and Patient connected to face mask oxygen  Post-op Assessment: Post -op Vital signs reviewed and stable  Post vital signs: Reviewed and stable  Complications: No apparent anesthesia complications

## 2012-12-21 NOTE — Progress Notes (Signed)
Paged Dr. Lovell Sheehan concerning antibiotic order. PT allergic to cephalsporins and amoxillin. Pre-op antibiotic is Ancef.  Has not returned page.

## 2012-12-21 NOTE — Anesthesia Postprocedure Evaluation (Signed)
  Anesthesia Post-op Note  Patient: Carrie Austin  Procedure(s) Performed: Procedure(s): LUMBAR THREE-FOUR,LUMBAR FOUR-FIVE LAMINECTOMIES WITH POSTERIOR LUMBAR INTERBODY FUSION WITH INTERBODY PROSTHESIS.POSTERIOR LATERAL ARTHRODESIS AND POSTERIOR SEGMENTAL INSTRUMENTATION (N/A)  Patient Location: PACU  Anesthesia Type:General  Level of Consciousness: awake  Airway and Oxygen Therapy: Patient Spontanous Breathing  Post-op Pain: mild  Post-op Assessment: Post-op Vital signs reviewed  Post-op Vital Signs: Reviewed  Complications: No apparent anesthesia complications

## 2012-12-21 NOTE — Progress Notes (Signed)
Patient ID: Carrie Austin, female   DOB: 23-Dec-1945, 67 y.o.   MRN: [id number] Subjective:   The patient is somnolent but easily arousable. She is in no apparent distress.  Objective: Vital signs in last 24 hours: Temp:  [97.9 F (36.6 C)-98.3 F (36.8 C)] 98.3 F (36.8 C) (12/11 1539) Pulse Rate:  [69-109] 103 (12/11 1600) Resp:  [16-18] 16 (12/11 1600) BP: (117-155)/(51-70) 137/51 mmHg (12/11 1600) SpO2:  [100 %] 100 % (12/11 1600)  Intake/Output from previous day:   Intake/Output this shift: Total I/O In: 3140 [I.V.:2750; Blood:140; IV Piggyback:250] Out: 1200 [Urine:800; Blood:400]  Physical exam the patient is somnolent but arousable. She is moving her lower extremities well.  Lab Results: No results found for this basename: WBC, HGB, HCT, PLT,  in the last 72 hours BMET No results found for this basename: NA, K, CL, CO2, GLUCOSE, BUN, CREATININE, CALCIUM,  in the last 72 hours  Studies/Results: Dg Lumbar Spine 1 View  12/21/2012   CLINICAL DATA:  Fusion L3-L5.  EXAM: LUMBAR SPINE - 1 VIEW  COMPARISON:  At outside MR 07/11/2012  FINDINGS: Single intraoperative lateral views submitted for review after surgery.  If the last fully open disc space is labeled L5-S1, there is 1 cm anterior slip at the L4-5 level and 6 mm of anterior slip at the L5-S1 level. Metallic probe posterior to the lower L5 level. Surgical spreaders posterior to the L3 spinous process and L5 spinous process with surgical sponge in place.  Vascular calcifications.  IMPRESSION: Metallic probe posterior to the lower L5 level. Please see above discussion. Correlation with outside MR report recommended to confirm this is the level assignment previously utilized   Electronically Signed   By: Bridgett Larsson M.D.   On: 12/21/2012 14:53    Assessment/Plan: The patient is doing well.  LOS: 0 days     Carrie Austin D 12/21/2012, 4:13 PM

## 2012-12-21 NOTE — Anesthesia Preprocedure Evaluation (Signed)
Anesthesia Evaluation   Patient awake    Reviewed: Allergy & Precautions, H&P , NPO status , Patient's Chart, lab work & pertinent test results  Airway Mallampati: II      Dental   Pulmonary          Cardiovascular hypertension,     Neuro/Psych  Headaches, Anxiety Depression    GI/Hepatic Neg liver ROS, GERD-  ,  Endo/Other  negative endocrine ROS  Renal/GU negative Renal ROS     Musculoskeletal  (+) Fibromyalgia -  Abdominal   Peds  Hematology   Anesthesia Other Findings   Reproductive/Obstetrics                           Anesthesia Physical Anesthesia Plan  ASA: III  Anesthesia Plan: General   Post-op Pain Management:    Induction: Intravenous  Airway Management Planned: Oral ETT  Additional Equipment:   Intra-op Plan:   Post-operative Plan: Extubation in OR  Informed Consent: I have reviewed the patients History and Physical, chart, labs and discussed the procedure including the risks, benefits and alternatives for the proposed anesthesia with the patient or authorized representative who has indicated his/her understanding and acceptance.   Dental advisory given  Plan Discussed with: CRNA, Anesthesiologist and Surgeon  Anesthesia Plan Comments:         Anesthesia Quick Evaluation

## 2012-12-21 NOTE — Preoperative (Signed)
Beta Blockers   Reason not to administer Beta Blockers:Not Applicable 

## 2012-12-21 NOTE — Progress Notes (Signed)
Pt. Complaining of headache 7/10.  Dr. Randa Evens called does not want her to have anything at this time,pt. Informed.

## 2012-12-21 NOTE — Op Note (Signed)
Brief history: The patient is a 67 year old white female who has complained of back and leg pain consistent with neurogenic claudication. She has failed medical management and was worked up with a lumbar MRI and lumbar x-rays. This demonstrated the patient has multilevel degenerative changes the most significant of which was a spondylolisthesis and spinal stenosis at L3-4 and L4-5. I discussed the various treatment options with the patient including surgery. She has weighed the risks, benefits, and alternatives surgery and decided proceed with a L3-4 and L4-5 decompression, instrumentation, and fusion.  Preoperative diagnosis: L3-4 and L4-5 spondylolisthesis, Degenerative disc disease, spinal stenosis compressing bilateral L3, L4 and L5 nerve roots; lumbago; lumbar radiculopathy, neurogenic claudication  Postoperative diagnosis: The same  Procedure: L4 laminectomy and Bilateral L3 Laminotomy/foraminotomies to decompress the bilateral L3, L4 and L5 nerve roots(the work required to do this was in addition to the work required to do the posterior lumbar interbody fusion because of the patient's spinal stenosis, facet arthropathy. Etc. requiring a wide decompression of the nerve roots.); L3-4 and L4-5 posterior lumbar interbody fusion with local morselized autograft bone and Actifusebone graft extender; insertion of interbody prosthesis at L3-4 and L4-5 (globus peek interbody prosthesis); posterior segmental instrumentation from L3 to L5 with globus titanium pedicle screws and rods; posterior lateral arthrodesis at L3-4 and L4-5 with local morselized autograft bone and Vitoss bone graft extender.  Surgeon: Dr. Delma Officer  Asst.: Dr. Maeola Harman  Anesthesia: Gen. endotracheal  Estimated blood loss: 250 cc  Drains: One medium Hemovac  Complications: None  Description of procedure: The patient was brought to the operating room by the anesthesia team. General endotracheal anesthesia was induced. The  patient was turned to the prone position on the Wilson frame. The patient's lumbosacral region was then prepared with Betadine scrub and Betadine solution. Sterile drapes were applied.  I then injected the area to be incised with Marcaine with epinephrine solution. I then used the scalpel to make a linear midline incision over the L3-4 and L4-5 interspace. I then used electrocautery to perform a bilateral subperiosteal dissection exposing the spinous process and lamina of L3, L4 and L5. We then obtained intraoperative radiograph to confirm our location. We then inserted the Verstrac retractor to provide exposure.  I began the decompression by using the high speed drill to perform laminotomies at L3 and L4 bilaterally. I then incised the L3-4 and L4-5 interspinous ligament. I used Leksell nodule were to remove the spinous process of L4 and the caudal aspect of the L3 spinous process. We then used the Kerrison punches to widen the laminotomy and removed the ligamentum flavum at L3-4 and L4-5 as well as to remove the cephalad aspect of the L5 lamina.. We used the Kerrison punches to remove the medial facets at L3-4 and L4-5 bilaterally. We performed wide foraminotomies about the bilateral L3, L4 and L5 nerve roots completing the decompression.  We now turned our attention to the posterior lumbar interbody fusion. I used a scalpel to incise the intervertebral disc at L3-4 and L4-5. I then performed a partial intervertebral discectomy at L3-4 and L4-5 using the pituitary forceps. We prepared the vertebral endplates at L3-4 and L4-5 for the fusion by removing the soft tissues with the curettes. We then used the trial spacers to pick the appropriate sized interbody prosthesis. We prefilled his prosthesis with a combination of local morselized autograft bone that we obtained during the decompression as well as Actifuse bone graft extender. We inserted the prefilled prosthesis into the  interspace at L3-4 and L4-5.  There was a good snug fit of the prosthesis in the interspace. We then filled and the remainder of the intervertebral disc space with local morselized autograft bone and Actifuse. This completed the posterior lumbar interbody arthrodesis.  We now turned attention to the instrumentation. Under fluoroscopic guidance we cannulated the bilateral L3, L4 and L5 pedicles with the bone probe. We then removed the bone probe. We then tapped the pedicle with a 5.5 millimeter tap. We then removed the tap. We probed inside the tapped pedicle with a ball probe to rule out cortical breaches. We then inserted a 6.5 x 45 and 35 millimeter pedicle screw into the L3, L4 and L5 pedicles bilaterally under fluoroscopic guidance. We then palpated along the medial aspect of the pedicles to rule out cortical breaches. There were none. The nerve roots were not injured. We then connected the unilateral pedicle screws with a lordotic rod. We compressed the construct and secured the rod in place with the caps. We then tightened the caps appropriately. This completed the instrumentation from L3-L5.  We now turned our attention to the posterior lateral arthrodesis at L3-4 and L4-5. We used the high-speed drill to decorticate the remainder of the facets, pars, transverse process at L3-4 and L4-5. We then applied a combination of local morselized autograft bone and Vitoss bone graft extender over these decorticated posterior lateral structures. This completed the posterior lateral arthrodesis.  We then obtained hemostasis using bipolar electrocautery. We irrigated the wound out with bacitracin solution. We inspected the thecal sac and nerve roots and noted they were well decompressed. We then removed the retractor. We placed a medium Hemovac drain in the epidural space and tunneled out through separate stab wound. We reapproximated patient's thoracolumbar fascia with interrupted #1 Vicryl suture. We reapproximated patient's subcutaneous  tissue with interrupted 2-0 Vicryl suture. The reapproximated patient's skin with Steri-Strips and benzoin. The wound was then coated with bacitracin ointment. A sterile dressing was applied. The drapes were removed. The patient was subsequently returned to the supine position where they were extubated by the anesthesia team. He was then transported to the post anesthesia care unit in stable condition. All sponge instrument and needle counts were reportedly correct at the end of this case.

## 2012-12-22 LAB — CBC
HCT: 26.4 % — ABNORMAL LOW (ref 36.0–46.0)
Hemoglobin: 9.4 g/dL — ABNORMAL LOW (ref 12.0–15.0)
MCH: 30.9 pg (ref 26.0–34.0)
MCHC: 35.6 g/dL (ref 30.0–36.0)
MCV: 86.8 fL (ref 78.0–100.0)
Platelets: 137 10*3/uL — ABNORMAL LOW (ref 150–400)
RBC: 3.04 MIL/uL — ABNORMAL LOW (ref 3.87–5.11)
RDW: 13 % (ref 11.5–15.5)
WBC: 8.8 10*3/uL (ref 4.0–10.5)

## 2012-12-22 LAB — BASIC METABOLIC PANEL
BUN: 9 mg/dL (ref 6–23)
CO2: 27 mEq/L (ref 19–32)
Calcium: 7.8 mg/dL — ABNORMAL LOW (ref 8.4–10.5)
Chloride: 101 mEq/L (ref 96–112)
Creatinine, Ser: 0.63 mg/dL (ref 0.50–1.10)
GFR calc Af Amer: >90 mL/min (ref >90)
GFR calc non Af Amer: >90 mL/min (ref >90)
Glucose, Bld: 88 mg/dL (ref 70–99)
Potassium: 4 mEq/L (ref 3.5–5.1)
Sodium: 136 mEq/L (ref 135–145)

## 2012-12-22 MED ORDER — PROMETHAZINE HCL 25 MG/ML IJ SOLN: 12.5000 mg | Freq: Four times a day (QID) | INTRAMUSCULAR | Status: DC | PRN

## 2012-12-22 MED ORDER — ONDANSETRON HCL 4 MG/2ML IJ SOLN
4.0000 mg | INTRAMUSCULAR | Status: DC | PRN
  Administered 2012-12-22: 4 mg via INTRAVENOUS
  Filled 2012-12-22: qty 2

## 2012-12-22 MED ORDER — MORPHINE SULFATE 2 MG/ML IJ SOLN: 2.0000 mg | INTRAMUSCULAR | Status: DC | PRN

## 2012-12-22 MED FILL — Heparin Sodium (Porcine) Inj 1000 Unit/ML: INTRAMUSCULAR | Qty: 30 | Status: AC

## 2012-12-22 MED FILL — Sodium Chloride IV Soln 0.9%: INTRAVENOUS | Qty: 1000 | Status: AC

## 2012-12-22 NOTE — Progress Notes (Signed)
Patient ID: Carrie Austin, female   DOB: Mar 18, 1945, 67 y.o.   MRN: [id number] Subjective:  The patient is alert and pleasant. She looks well. She is in no apparent distress. The patient's Hemovac became disconnected.  Objective: Vital signs in last 24 hours: Temp:  [97.4 F (36.3 C)-98.3 F (36.8 C)] 98.3 F (36.8 C) (12/12 0549) Pulse Rate:  [84-114] 84 (12/12 0549) Resp:  [8-20] 11 (12/12 0752) BP: (95-155)/(29-70) 97/39 mmHg (12/12 0549) SpO2:  [98 %-100 %] 100 % (12/12 0752) Weight:  [49.1 kg (108 lb 3.9 oz)] 49.1 kg (108 lb 3.9 oz) (12/11 1735)  Intake/Output from previous day: 12/11 0701 - 12/12 0700 In: 3140 [I.V.:2750; Blood:140; IV Piggyback:250] Out: 2600 [Urine:2000; Drains:200; Blood:400] Intake/Output this shift:    Physical exam the patient is alert and pleasant. She looks well. Her strength is normal in her lower extremities.  Lab Results:  Recent Labs  12/22/12 0620  WBC 8.8  HGB 9.4*  HCT 26.4*  PLT 137*   BMET  Recent Labs  12/22/12 0620  NA 136  K 4.0  CL 101  CO2 27  GLUCOSE 88  BUN 9  CREATININE 0.63  CALCIUM 7.8*    Studies/Results: Dg Lumbar Spine 2-3 Views  12/21/2012   CLINICAL DATA:  Fusion L3-4 and L4-5.  EXAM: LUMBAR SPINE - 2-3 VIEW; DG C-ARM 1-60 MIN  COMPARISON:  Intraoperative exam 12/21/2012.  Outside MR 07/11/2012.  Fluoroscopic time:  15 seconds  FINDINGS: Two intraoperative C-arm views are submitted for review after surgery.  If the last fully open disc space is labeled L5-S1, pedicle screws have been placed at the L3, L4 and L5 level bilaterally with interbody spacer at the L3-4 and L4-5 level.  1 cm anterior slippage of L4.  Mild anterior slip of L5.  Vascular calcifications.  IMPRESSION: Fusion of L3-4 and L4-5 as noted above. Correlation with outside MR report recommended to confirm this is level assignment previously utilized.   Electronically Signed   By: Bridgett Larsson M.D.   On: 12/21/2012 16:21   Dg Lumbar Spine 1  View  12/21/2012   CLINICAL DATA:  Fusion L3-L5.  EXAM: LUMBAR SPINE - 1 VIEW  COMPARISON:  At outside MR 07/11/2012  FINDINGS: Single intraoperative lateral views submitted for review after surgery.  If the last fully open disc space is labeled L5-S1, there is 1 cm anterior slip at the L4-5 level and 6 mm of anterior slip at the L5-S1 level. Metallic probe posterior to the lower L5 level. Surgical spreaders posterior to the L3 spinous process and L5 spinous process with surgical sponge in place.  Vascular calcifications.  IMPRESSION: Metallic probe posterior to the lower L5 level. Please see above discussion. Correlation with outside MR report recommended to confirm this is the level assignment previously utilized   Electronically Signed   By: Bridgett Larsson M.D.   On: 12/21/2012 14:53   Dg C-arm 1-60 Min  12/21/2012   CLINICAL DATA:  Fusion L3-4 and L4-5.  EXAM: LUMBAR SPINE - 2-3 VIEW; DG C-ARM 1-60 MIN  COMPARISON:  Intraoperative exam 12/21/2012.  Outside MR 07/11/2012.  Fluoroscopic time:  15 seconds  FINDINGS: Two intraoperative C-arm views are submitted for review after surgery.  If the last fully open disc space is labeled L5-S1, pedicle screws have been placed at the L3, L4 and L5 level bilaterally with interbody spacer at the L3-4 and L4-5 level.  1 cm anterior slippage of L4.  Mild anterior slip of  L5.  Vascular calcifications.  IMPRESSION: Fusion of L3-4 and L4-5 as noted above. Correlation with outside MR report recommended to confirm this is level assignment previously utilized.   Electronically Signed   By: Bridgett Larsson M.D.   On: 12/21/2012 16:21    Assessment/Plan: Postop day #1: The patient is doing well. I will discontinue her PCA and Hemovac. We will mobilize her with PT and OT. She will likely go home this weekend. I have given her her discharge instructions and answered all her questions.  LOS: 1 day     Brittnae Aschenbrenner D 12/22/2012, 7:57 AM

## 2012-12-22 NOTE — Evaluation (Signed)
Physical Therapy Evaluation Patient Details Name: Carrie Austin MRN: [id number] DOB: [birth year] Today's Date: 12/22/2012 Time: 4332-9518 PT Time Calculation (min): 22 min  PT Assessment / Plan / Recommendation History of Present Illness  67 yo female s/p L3-5 PLIF with back brace  Clinical Impression  Patient demonstrates deficits in functional mobility as indicated below. Limited assessment secondary to no brace present at this time, will follow up for ambulation assessment this afternoon. Based on mobility assessed, patient will benefit from continued skilled PT to address deficits and maximize function. Will continue to see as indicated and progress activity as tolerated.     PT Assessment  Patient needs continued PT services    Follow Up Recommendations  No PT follow up;Supervision/Assistance - 24 hour          Equipment Recommendations  Rolling walker with 5" wheels       Frequency Min 5X/week    Precautions / Restrictions Precautions Precautions: Back Precaution Booklet Issued: Yes (comment) Precaution Comments: educated patient at length regarding precautions, reviewed with hand out and visualization Required Braces or Orthoses: Spinal Brace Spinal Brace: Lumbar corset Restrictions Weight Bearing Restrictions: No   Pertinent Vitals/Pain 5/10 back pain      Mobility  Bed Mobility Bed Mobility: Rolling Right;Rolling Left;Supine to Sit;Sitting - Scoot to [place] of Bed;Sit to Supine Rolling Right: 5: Supervision Rolling Left: 5: Supervision Supine to Sit: 5: Supervision Sitting - Scoot to Edge of Bed: 5: Supervision Sit to Supine: 5: Supervision Details for Bed Mobility Assistance: VCs for log roll technique and safe movement with compliance to back precautions Transfers Transfers: Not assessed Details for Transfer Assistance: no brace available at this time Ambulation/Gait Ambulation/Gait Assistance: Not tested (comment)        PT Diagnosis: Difficulty  walking;Acute pain  PT Problem List: Decreased range of motion;Decreased activity tolerance;Decreased mobility;Decreased knowledge of use of DME;Pain PT Treatment Interventions: DME instruction;Gait training;Stair training;Functional mobility training;Therapeutic activities;Therapeutic exercise;Balance training;Patient/family education     PT Goals(Current goals can be found in the care plan section) Acute Rehab PT Goals Patient Stated Goal: to go home to fiance's house PT Goal Formulation: With patient Time For Goal Achievement: 01/05/13 Potential to Achieve Goals: Good  Visit Information  Last PT Received On: 12/22/12 Assistance Needed: +1 PT/OT/SLP Co-Evaluation/Treatment: Yes PT goals addressed during session: Mobility/safety with mobility History of Present Illness: 67 yo female s/p L3-5 PLIF with back brace       Prior Functioning  Home Living Family/patient expects to be discharged to:: Private residence Living Arrangements: Spouse/significant other Available Help at Discharge: Available 24 hours/day Type of Home: House Home Access: Stairs to enter Secretary/administrator of Steps: 1 Entrance Stairs-Rails: None Home Layout: One level Home Equipment: None Prior Function Level of Independence: Independent Communication Communication:  (wears glasses) Dominant Hand: Right    Cognition  Cognition Arousal/Alertness: Awake/alert Behavior During Therapy: WFL for tasks assessed/performed Overall Cognitive Status: Within Functional Limits for tasks assessed    Extremity/Trunk Assessment Upper Extremity Assessment Upper Extremity Assessment:  (history of arthritic changes in hands) Lower Extremity Assessment Lower Extremity Assessment: Overall WFL for tasks assessed   Balance  Seated EOB 7:Independent   End of Session PT - End of Session Equipment Utilized During Treatment: Oxygen Activity Tolerance: Patient tolerated treatment well Patient left: in bed;with call  bell/phone within reach Nurse Communication: Mobility status;Other (comment) (awaiting brace arrival for OOB)  GP     Fabio Asa 12/22/2012, 9:23 AM Charlotte Crumb, PT DPT  [phone]   

## 2012-12-22 NOTE — Progress Notes (Signed)
Orthopedic Tech Progress Note Patient Details:  Carrie Austin 05-26-1945 [id number] Spoke with patient's nurse; patient was pre-fit for brace and family member is to bring brace to hospital for use.  Patient ID: Carrie Austin, female   DOB: 1945-12-13, 67 y.o.   MRN: [id number]   [name] 12/22/2012, 10:19 AM

## 2012-12-22 NOTE — Progress Notes (Signed)
Utilization review completed.  

## 2012-12-22 NOTE — Progress Notes (Signed)
Physical Therapy Treatment Patient Details Name: Carrie Austin MRN: [id number] DOB: 04-Jun-1945 Today's Date: 12/22/2012 Time: 4010-2725 PT Time Calculation (min): 26 min  PT Assessment / Plan / Recommendation  History of Present Illness 67 yo female s/p L3-5 PLIF with back brace   PT Comments   Patient able to begin ambulation today. Ambulated in hall with close supervision. Some modest instability and dizziness reported. Will continue to see and progress activity as tolerated.   Follow Up Recommendations  No PT follow up;Supervision/Assistance - 24 hour     Does the patient have the potential to tolerate intense rehabilitation     Barriers to Discharge        Equipment Recommendations  Rolling walker with 5" wheels    Recommendations for Other Services    Frequency Min 5X/week   Progress towards PT Goals Progress towards PT goals: Progressing toward goals  Plan Current plan remains appropriate    Precautions / Restrictions Precautions Precautions: Back Precaution Booklet Issued: Yes (comment) Precaution Comments: educated patient at length regarding precautions, reviewed with hand out and visualization Required Braces or Orthoses: Spinal Brace Spinal Brace: Lumbar corset Restrictions Weight Bearing Restrictions: No   Pertinent Vitals/Pain 7/10    Mobility  Bed Mobility Bed Mobility: Rolling Left;Left Sidelying to Sit;Sitting - Scoot to [place] of Bed Rolling Right: 5: Supervision Rolling Left: 5: Supervision Left Sidelying to Sit: 5: Supervision Supine to Sit: 5: Supervision Sitting - Scoot to Edge of Bed: 5: Supervision Sit to Supine: 5: Supervision Details for Bed Mobility Assistance: VCs for log roll technique and safe movement with compliance to back precautions Transfers Transfers: Sit to Stand;Stand to Sit Sit to Stand: 4: Min assist Stand to Sit: 4: Min assist Details for Transfer Assistance: VCs for hand placement, assist for elevation and controlled  movement Ambulation/Gait Ambulation/Gait Assistance: 5: Supervision Ambulation Distance (Feet): 210 Feet Assistive device: Rolling walker Ambulation/Gait Assistance Details: VCs for upright posture Gait Pattern: Step-through pattern;Decreased stride length Gait velocity: decreased General Gait Details: steady with ambulation      PT Goals (current goals can now be found in the care plan section) Acute Rehab PT Goals Patient Stated Goal: to go home to fiance's house PT Goal Formulation: With patient Time For Goal Achievement: 01/05/13 Potential to Achieve Goals: Good  Visit Information  Last PT Received On: 12/22/12 Assistance Needed: +1 PT/OT/SLP Co-Evaluation/Treatment: Yes Reason for Co-Treatment: For patient/therapist safety OT goals addressed during session: ADL's and self-care History of Present Illness: 67 yo female s/p L3-5 PLIF with back brace    Subjective Data  Patient Stated Goal: to go home to fiance's house   Cognition  Cognition Arousal/Alertness: Awake/alert Behavior During Therapy: WFL for tasks assessed/performed Overall Cognitive Status: Within Functional Limits for tasks assessed    Balance  Balance Balance Assessed: Yes Static Sitting Balance Static Sitting - Balance Support: No upper extremity supported;Feet supported Static Sitting - Level of Assistance: 7: Independent  End of Session PT - End of Session Equipment Utilized During Treatment: Gait belt;Back brace Activity Tolerance: Patient tolerated treatment well Patient left: in chair;with call bell/phone within reach;with family/visitor present Nurse Communication: Mobility status   GP     Fabio Asa 12/22/2012, 1:05 PM Charlotte Crumb, PT DPT  [phone]

## 2012-12-22 NOTE — Evaluation (Signed)
Occupational Therapy Evaluation Patient Details Name: Carrie Austin MRN: [id number] DOB: 1945-02-12 Today's Date: 12/22/2012 Time: 4098-1191 OT Time Calculation (min): 22 min  OT Assessment / Plan / Recommendation History of present illness 67 yo female s/p L3-5 PLIF with back brace   Clinical Impression   Patient is s/p L3-5 PLIF surgery resulting in functional limitations due to the deficits listed below (see OT problem list).  Patient will benefit from skilled OT acutely to increase independence and safety with ADLS to allow discharge home.     OT Assessment  Patient needs continued OT Services    Follow Up Recommendations  No OT follow up    Barriers to Discharge      Equipment Recommendations  None recommended by OT    Recommendations for Other Services    Frequency  Min 2X/week    Precautions / Restrictions Precautions Precautions: Back Precaution Booklet Issued: Yes (comment) Precaution Comments: educated patient at length regarding precautions, reviewed with hand out and visualization Required Braces or Orthoses: Spinal Brace Spinal Brace: Lumbar corset Restrictions Weight Bearing Restrictions: No   Pertinent Vitals/Pain 5 out 10    ADL  Eating/Feeding: Independent Where Assessed - Eating/Feeding: Bed level Lower Body Dressing: Supervision/safety Where Assessed - Lower Body Dressing: Supine, head of bed flat Transfers/Ambulation Related to ADLs: Pt only able to complete bed mobility due to no brace present at this time.  ADL Comments: Pt was educated on adls with back precautions and educated on bed mobility. pt with good retun demo. Pt has tub shower at home. Next session OT will focus on brace positioning. Pt i s able to cross bil LE so no AE required at this time    OT Diagnosis: Generalized weakness;Acute pain  OT Problem List: Decreased strength;Decreased activity tolerance;Impaired balance (sitting and/or standing);Decreased knowledge of use of DME  or AE;Decreased knowledge of precautions;Decreased safety awareness;Pain OT Treatment Interventions: Self-care/ADL training;Therapeutic exercise;DME and/or AE instruction;Therapeutic activities;Patient/family education;Balance training   OT Goals(Current goals can be found in the care plan section) Acute Rehab OT Goals Patient Stated Goal: to go home to fiance's house OT Goal Formulation: With patient Time For Goal Achievement: 01/05/13 Potential to Achieve Goals: Good  Visit Information  Last OT Received On: 12/22/12 Assistance Needed: +1 PT/OT/SLP Co-Evaluation/Treatment: Yes Reason for Co-Treatment: For patient/therapist safety PT goals addressed during session: Mobility/safety with mobility OT goals addressed during session: ADL's and self-care History of Present Illness: 67 yo female s/p L3-5 PLIF with back brace       Prior Functioning     Home Living Family/patient expects to be discharged to:: Private residence Living Arrangements: Spouse/significant other Available Help at Discharge: Available 24 hours/day Type of Home: House Home Access: Stairs to enter Secretary/administrator of Steps: 1 Entrance Stairs-Rails: None Home Layout: One level Home Equipment: None Prior Function Level of Independence: Independent Communication Communication: No difficulties (wears glasses) Dominant Hand: Right         Vision/Perception Vision - History Baseline Vision: Wears glasses all the time Patient Visual Report: No change from baseline   Cognition  Cognition Arousal/Alertness: Awake/alert Behavior During Therapy: WFL for tasks assessed/performed Overall Cognitive Status: Within Functional Limits for tasks assessed    Extremity/Trunk Assessment Upper Extremity Assessment Upper Extremity Assessment: Overall WFL for tasks assessed (hx of arthritic changes in hands) Lower Extremity Assessment Lower Extremity Assessment: Defer to PT evaluation Cervical / Trunk  Assessment Cervical / Trunk Assessment: Normal     Mobility Bed Mobility Bed Mobility: Rolling  Right;Rolling Left;Supine to Sit;Sitting - Scoot to [place] of Bed;Sit to Supine Rolling Right: 5: Supervision Rolling Left: 5: Supervision Supine to Sit: 5: Supervision Sitting - Scoot to Edge of Bed: 5: Supervision Sit to Supine: 5: Supervision Details for Bed Mobility Assistance: VCs for log roll technique and safe movement with compliance to back precautions Transfers Details for Transfer Assistance: no brace available at this time     Exercise     Balance Balance Balance Assessed: Yes Static Sitting Balance Static Sitting - Balance Support: No upper extremity supported;Feet supported Static Sitting - Level of Assistance: 7: Independent   End of Session OT - End of Session Activity Tolerance: Patient tolerated treatment well Patient left: in bed;with call bell/phone within reach Nurse Communication: Mobility status;Precautions  GO     Harolyn Rutherford 12/22/2012, 9:46 AM Pager: [phone]

## 2012-12-23 NOTE — Consult Note (Signed)
Spoke to patient and husband and they both advised me that patient has two walkers with wheels at home.

## 2012-12-23 NOTE — Progress Notes (Signed)
Physical Therapy Treatment Patient Details Name: Carrie Austin MRN: [id number] DOB: 10-Apr-1945 Today's Date: 12/23/2012 Time: 1142-1206 PT Time Calculation (min): 24 min  PT Assessment / Plan / Recommendation  History of Present Illness 67 yo female s/p L3-5 PLIF with back brace   PT Comments   Patient demonstrates improvements in mobility and ambulation distance today. Patient does report increased pain today, spoke with patient and significant other at length regarding positional changes and expectations for mobility to assist with pain management. Will continue to see as indicated and progress as tolerated.   Follow Up Recommendations  No PT follow up;Supervision/Assistance - 24 hour     Does the patient have the potential to tolerate intense rehabilitation     Barriers to Discharge        Equipment Recommendations  Rolling walker with 5" wheels    Recommendations for Other Services    Frequency Min 5X/week   Progress towards PT Goals Progress towards PT goals: Progressing toward goals  Plan Current plan remains appropriate    Precautions / Restrictions Precautions Precautions: Back Precaution Booklet Issued: Yes (comment) Precaution Comments: educated patient at length regarding precautions, reviewed with hand out and visualization Required Braces or Orthoses: Spinal Brace Spinal Brace: Lumbar corset Restrictions Weight Bearing Restrictions: No   Pertinent Vitals/Pain 9/10    Mobility  Bed Mobility Bed Mobility: Rolling Right;Right Sidelying to Sit;Sitting - Scoot to Edge of Bed Rolling Right: 5: Supervision Right Sidelying to Sit: 5: Supervision Sitting - Scoot to Edge of Bed: 5: Supervision Transfers Transfers: Sit to Stand;Stand to Sit Sit to Stand: 4: Min assist;From bed;From chair/3-in-1;From toilet Stand to Sit: 5: Supervision;To chair/3-in-1;To toilet Details for Transfer Assistance: VCs for hand placement, assist for elevation and controlled  movement Ambulation/Gait Ambulation/Gait Assistance: 5: Supervision Ambulation Distance (Feet): 410 Feet Assistive device: Rolling walker Ambulation/Gait Assistance Details: Improved stbaility with ambulation today despite increased pain Gait Pattern: Step-through pattern;Decreased stride length Gait velocity: improved General Gait Details: steady with ambulation    Exercises     PT Diagnosis:    PT Problem List:   PT Treatment Interventions:     PT Goals (current goals can now be found in the care plan section) Acute Rehab PT Goals Patient Stated Goal: to go home to fiance's house PT Goal Formulation: With patient Time For Goal Achievement: 01/05/13 Potential to Achieve Goals: Good  Visit Information  Last PT Received On: 12/23/12 Assistance Needed: +1 PT/OT/SLP Co-Evaluation/Treatment: Yes History of Present Illness: 67 yo female s/p L3-5 PLIF with back brace    Subjective Data  Subjective: i have to use the bathroom Patient Stated Goal: to go home to fiance's house   Cognition  Cognition Arousal/Alertness: Awake/alert Behavior During Therapy: WFL for tasks assessed/performed Overall Cognitive Status: Within Functional Limits for tasks assessed    Balance  Static Sitting Balance Static Sitting - Balance Support: No upper extremity supported;Feet supported Static Sitting - Level of Assistance: 7: Independent  End of Session PT - End of Session Equipment Utilized During Treatment: Gait belt;Back brace Activity Tolerance: Patient tolerated treatment well Patient left: in chair;with call bell/phone within reach;with family/visitor present Nurse Communication: Mobility status   GP     Fabio Asa 12/23/2012, 12:41 PM Charlotte Crumb, PT DPT  [phone]

## 2012-12-23 NOTE — Progress Notes (Addendum)
Occupational Therapy Treatment Patient Details Name: Carrie Austin MRN: [id number] DOB: 11/20/1945 Today's Date: 12/23/2012 Time: 1410-1433 OT Time Calculation (min): 23 min  OT Assessment / Plan / Recommendation  History of present illness 67 yo female s/p L3-5 PLIF with back brace   OT comments  Pt progressing toward goals.  Incr time during functional mobility due to pain.  Follow Up Recommendations  No OT follow up    Barriers to Discharge       Equipment Recommendations  3 in 1 bedside comode    Recommendations for Other Services    Frequency Min 2X/week   Progress towards OT Goals Progress towards OT goals: Progressing toward goals  Plan Equipment recommendations need to be updated    Precautions / Restrictions Precautions Precautions: Back Precaution Booklet Issued: Yes (comment) Precaution Comments: Pt able to recall 2/3 back precautions. Required Braces or Orthoses: Spinal Brace Spinal Brace: Lumbar corset Restrictions Weight Bearing Restrictions: No   Pertinent Vitals/Pain See vitals    ADL  Grooming: Performed;Wash/dry hands;Supervision/safety Where Assessed - Grooming: Unsupported standing Upper Body Dressing: Performed;Set up (donned back brace) Where Assessed - Upper Body Dressing: Unsupported sitting Toilet Transfer: Performed;Min guard Toilet Transfer Method: Sit to Barista: Comfort height toilet Toileting - Clothing Manipulation and Hygiene: Performed;Min guard Where Assessed - Engineer, mining and Hygiene: Sit to stand from 3-in-1 or toilet Equipment Used: Back brace;Rolling walker Transfers/Ambulation Related to ADLs: Supervision ambulating with RW. ADL Comments: Pt with tendency to close her eyes when ambulating or standing at sink but denise any dizziness.  States she just feels tired.  Pt completed toileting tasks at overall min guard level.  She requires mod vc's for safe hand placement during  functional transfers.    OT Diagnosis:    OT Problem List:   OT Treatment Interventions:     OT Goals(current goals can now be found in the care plan section) Acute Rehab OT Goals Patient Stated Goal: to go home to fiance's house OT Goal Formulation: With patient Time For Goal Achievement: 01/05/13 Potential to Achieve Goals: Good ADL Goals Pt Will Perform Grooming: with modified independence;standing Pt Will Perform Upper Body Dressing: with modified independence;sitting Pt Will Perform Tub/Shower Transfer: Tub transfer;ambulating  Visit Information  Last OT Received On: 12/23/12 Assistance Needed: +1 OT goals addressed during session: ADL's and self-care History of Present Illness: 67 yo female s/p L3-5 PLIF with back brace    Subjective Data      Prior Functioning       Cognition  Cognition Arousal/Alertness: Awake/alert Behavior During Therapy: WFL for tasks assessed/performed Overall Cognitive Status: Within Functional Limits for tasks assessed    Mobility  Bed Mobility Bed Mobility: Right Sidelying to Sit;Rolling Right Rolling Right: 5: Supervision;With rail Right Sidelying to Sit: 5: Supervision Sitting - Scoot to Edge of Bed: 5: Supervision Details for Bed Mobility Assistance: VCs for technique. Transfers Transfers: Sit to Stand;Stand to Sit Sit to Stand: 4: Min guard;From bed;From toilet;With upper extremity assist Stand to Sit: 5: Supervision;To chair/3-in-1;To toilet;With upper extremity assist Details for Transfer Assistance: VCs for hand placement. Pt with tendency to keep hands on RW when sit<>stand.    Exercises      Balance    End of Session OT - End of Session Equipment Utilized During Treatment: Gait belt;Rolling walker;Back brace Activity Tolerance: Patient tolerated treatment well Patient left: with call bell/phone within reach;in chair;with family/visitor present Nurse Communication: Mobility status  GO    12/23/2012  Cipriano Mile OTR/L Pager [phone] Office [phone]  Cipriano Mile 12/23/2012, 2:57 PM

## 2012-12-23 NOTE — Progress Notes (Signed)
Postop day 2. Patient's pain somewhat better controlled. She's been able to ambulate with assistance. She still somewhat unsteady on her feet and does not feel ready for discharge home today but feel she will be able to be discharged tomorrow.  Afebrile. Vitals are stable. Awake and alert. Motor and sensory function intact. Wound dressing dry. Chest and abdomen benign.  Progressing well following lumbar decompression and fusion. Continue efforts at mobilization and pain control. Probable discharge home tomorrow.

## 2012-12-24 NOTE — Progress Notes (Signed)
Occupational Therapy Treatment Patient Details Name: Carrie Austin MRN: [id number] DOB: 08/14/1945 Today's Date: 12/24/2012 Time: 4098-1191 OT Time Calculation (min): 24 min  OT Assessment / Plan / Recommendation  History of present illness 67 yo female s/p L3-5 PLIF with back brace   OT comments  Pt progressing toward goals.   Follow Up Recommendations  No OT follow up    Barriers to Discharge       Equipment Recommendations  3 in 1 bedside comode    Recommendations for Other Services    Frequency Min 2X/week   Progress towards OT Goals Progress towards OT goals: Progressing toward goals  Plan Equipment recommendations need to be updated    Precautions / Restrictions Precautions Precautions: Back Precaution Comments: Pt able to recall 3/3 back precautions independently. Required Braces or Orthoses: Spinal Brace Spinal Brace: Lumbar corset   Pertinent Vitals/Pain See vitals    ADL  Grooming: Performed;Wash/dry hands;Brushing hair;Supervision/safety Where Assessed - Grooming: Unsupported standing Upper Body Dressing: Set up;Performed Where Assessed - Upper Body Dressing: Unsupported sitting Lower Body Dressing: Performed;Supervision/safety Where Assessed - Lower Body Dressing: Unsupported sitting Toilet Transfer: Performed;Supervision/safety Toilet Transfer Method: Sit to Barista: Comfort height toilet Toileting - Clothing Manipulation and Hygiene: Performed;Supervision/safety Where Assessed - Engineer, mining and Hygiene: Sit to stand from 3-in-1 or toilet Equipment Used: Back brace;Rolling walker Transfers/Ambulation Related to ADLs: Supervision ambulating with RW. ADL Comments:  Pt states she has a tub shower and a walk in shower, will able to use walk in shower at home. Discussed use of 3n1 as a shower chair as well as for over her toilet at home. Pt independently maintaining precautions throughout session, did good job of  not twisting during ADLs in bathroom.   Demonstrates correct donning technique and positioning for donning back brace.    OT Diagnosis:    OT Problem List:   OT Treatment Interventions:     OT Goals(current goals can now be found in the care plan section) Acute Rehab OT Goals Patient Stated Goal: to go home to fiance's house OT Goal Formulation: With patient Time For Goal Achievement: 01/05/13 Potential to Achieve Goals: Good ADL Goals Pt Will Perform Grooming: with modified independence;standing Pt Will Perform Upper Body Dressing: with modified independence;sitting Pt Will Perform Tub/Shower Transfer: Tub transfer;ambulating  Visit Information  Last OT Received On: 12/24/12 Assistance Needed: +1 OT goals addressed during session: ADL's and self-care History of Present Illness: 67 yo female s/p L3-5 PLIF with back brace    Subjective Data      Prior Functioning  Home Living Family/patient expects to be discharged to:: Private residence Living Arrangements: Spouse/significant other    Cognition  Cognition Arousal/Alertness: Awake/alert Behavior During Therapy: WFL for tasks assessed/performed Overall Cognitive Status: Within Functional Limits for tasks assessed    Mobility  Bed Mobility Bed Mobility: Rolling Right;Right Sidelying to Sit;Sitting - Scoot to [place] of Bed Rolling Right: 6: Modified independent (Device/Increase time) Right Sidelying to Sit: 6: Modified independent (Device/Increase time) Sitting - Scoot to Edge of Bed: 6: Modified independent (Device/Increase time) Details for Bed Mobility Assistance: Demo'd good log roll technique. Transfers Transfers: Sit to Stand;Stand to Sit Sit to Stand: 5: Supervision;From bed;From toilet;With upper extremity assist Stand to Sit: 5: Supervision;To toilet;To chair/3-in-1 Details for Transfer Assistance: Supervision for safety. Independently demo'd safe hand placement.    Exercises      Balance     End of Session  OT - End of Session Equipment  Utilized During Treatment: Gait belt;Rolling walker;Back brace Activity Tolerance: Patient tolerated treatment well Patient left: in chair;with call bell/phone within reach Nurse Communication: Mobility status  GO    12/24/2012 Cipriano Mile OTR/L Pager [phone] Office [phone]  Cipriano Mile 12/24/2012, 12:12 PM

## 2012-12-24 NOTE — Progress Notes (Signed)
Subjective: Patient reports Pain coming under somewhat better control.  Objective: Vital signs in last 24 hours: Temp:  [98.1 F (36.7 C)-99.8 F (37.7 C)] 99.2 F (37.3 C) (12/14 1035) Pulse Rate:  [86-97] 91 (12/14 1035) Resp:  [18] 18 (12/14 1035) BP: (122-139)/(38-56) 122/44 mmHg (12/14 1035) SpO2:  [95 %-100 %] 97 % (12/14 1035)  Intake/Output from previous day: 12/13 0701 - 12/14 0700 In: -  Out: 600 [Urine:600] Intake/Output this shift:    Motor function appears intact in lower extremities. Incision clean and dry. Drain with minimal output to be discontinued.  Lab Results:  Recent Labs  12/22/12 0620  WBC 8.8  HGB 9.4*  HCT 26.4*  PLT 137*   BMET  Recent Labs  12/22/12 0620  NA 136  K 4.0  CL 101  CO2 27  GLUCOSE 88  BUN 9  CREATININE 0.63  CALCIUM 7.8*    Studies/Results: No results found.  Assessment/Plan: Discontinue drain. Discontinue vancomycin.  LOS: 3 days  Mobilization possible discharge tomorrow.   Carrie Austin J 12/24/2012, 11:24 AM

## 2012-12-25 MED ORDER — DSS 100 MG PO CAPS: 100.0000 mg | ORAL_CAPSULE | Freq: Two times a day (BID) | ORAL | Status: DC

## 2012-12-25 MED ORDER — DIAZEPAM 5 MG PO TABS: 5.0000 mg | ORAL_TABLET | Freq: Four times a day (QID) | ORAL | Status: DC | PRN

## 2012-12-25 MED ORDER — PROMETHAZINE HCL 12.5 MG PO TABS: 12.5000 mg | ORAL_TABLET | Freq: Four times a day (QID) | ORAL | Status: DC | PRN

## 2012-12-25 MED ORDER — OXYCODONE-ACETAMINOPHEN 10-325 MG PO TABS: 1.0000 | ORAL_TABLET | ORAL | Status: DC | PRN

## 2012-12-25 NOTE — Discharge Summary (Signed)
Physician Discharge Summary  Patient ID: Carrie Austin MRN: [id number] DOB/AGE: September 19, 1945 67 y.o.  Admit date: 12/21/2012 Discharge date: 12/25/2012  Admission Diagnoses: L3-4 and L4-5 spondylolisthesis, spinal stenosis, lumbago, lumbar radiculopathy, neurogenic claudication  Discharge Diagnoses: The same Active Problems:   Spondylolisthesis of lumbar region   Discharged Condition: good  Hospital Course: I performed an L3-4 and L4-5 decompression, instrumentation, and fusion on the patient on 12/21/2012. The surgery went well.  The patient's postoperative course was unremarkable. She requested discharge to home on 12/25/2012. The patient was given oral and written discharge instructions. All her questions were answered.  Consults: PT/OT Significant Diagnostic Studies: None Treatments: L3-4 and L4-5 decompression, instrumentation, and fusion. Discharge Exam: Blood pressure 100/39, pulse 75, temperature 98.1 F (36.7 C), temperature source Oral, resp. rate 18, height 5' (1.524 m), weight 49.1 kg (108 lb 3.9 oz), SpO2 94.00%. The patient is alert and pleasant. She looks well. Her wound is healing well without signs of infection. Her strength is normal in her lower extremities.  Disposition: Home  Discharge Orders   Future Orders Complete By Expires   Call MD for:  difficulty breathing, headache or visual disturbances  As directed    Call MD for:  extreme fatigue  As directed    Call MD for:  hives  As directed    Call MD for:  persistant dizziness or light-headedness  As directed    Call MD for:  persistant nausea and vomiting  As directed    Call MD for:  redness, tenderness, or signs of infection (pain, swelling, redness, odor or green/yellow discharge around incision site)  As directed    Call MD for:  severe uncontrolled pain  As directed    Call MD for:  temperature >100.4  As directed    Diet - low sodium heart healthy  As directed    Discharge instructions  As  directed    Comments:     Call [phone] for a followup appointment. Take a stool softener while you are using pain medications.   Driving Restrictions  As directed    Comments:     Do not drive for 2 weeks.   Increase activity slowly  As directed    Lifting restrictions  As directed    Comments:     Do not lift more than 5 pounds. No excessive bending or twisting.   May shower / Bathe  As directed    Comments:     He may shower after the pain she is removed 3 days after surgery. Leave the incision alone.   No dressing needed  As directed        Medication List    STOP taking these medications       ALPRAZolam 0.5 MG tablet  Commonly known as:  XANAX      TAKE these medications       amitriptyline 25 MG tablet  Commonly known as:  ELAVIL  Take 75 mg by mouth at bedtime.     atorvastatin 20 MG tablet  Commonly known as:  LIPITOR  Take 20 mg by mouth once a week.     CALCIUM 600 + D PO  Take 1 tablet by mouth daily.     clobetasol ointment 0.05 %  Commonly known as:  TEMOVATE  Apply 1 application topically 2 (two) times daily as needed (for rash).     COD LIVER OIL PO  Take 5 mLs by mouth daily.     diazepam  5 MG tablet  Commonly known as:  VALIUM  Take 1 tablet (5 mg total) by mouth every 6 (six) hours as needed for muscle spasms.     DSS 100 MG Caps  Take 100 mg by mouth 2 (two) times daily.     escitalopram 20 MG tablet  Commonly known as:  LEXAPRO  Take 20 mg by mouth daily.     hydrocortisone 2.5 % cream  Apply 1 application topically 2 (two) times daily.     losartan 100 MG tablet  Commonly known as:  COZAAR  Take 100 mg by mouth daily.     magnesium oxide 400 MG tablet  Commonly known as:  MAG-OX  Take 400 mg by mouth daily.     multivitamin with minerals tablet  Take 1 tablet by mouth daily.     oxybutynin 5 MG tablet  Commonly known as:  DITROPAN  Take 10 mg by mouth daily.     oxyCODONE-acetaminophen 10-325 MG per tablet  Commonly  known as:  PERCOCET  Take 1 tablet by mouth every 4 (four) hours as needed for pain.     oxyCODONE-acetaminophen 10-325 MG per tablet  Commonly known as:  PERCOCET  Take 0.5-1 tablets by mouth every 4 (four) hours as needed for pain.     promethazine 12.5 MG tablet  Commonly known as:  PHENERGAN  Take 1 tablet (12.5 mg total) by mouth every 6 (six) hours as needed for nausea or vomiting.     Vitamin D 2000 UNITS Caps  Take 2,000 Units by mouth daily.         SignedCristi Loron 12/25/2012, 7:48 AM

## 2012-12-25 NOTE — Progress Notes (Signed)
Per patient's bedside nurse, patient does not want the 3:1 at this time; Jill Alexanders with [facility] made aware; Abelino Derrick RN,BSN,MHA [phone]

## 2012-12-25 NOTE — Progress Notes (Signed)
Physical Therapy Treatment Patient Details Name: Carrie Austin MRN: [id number] DOB: 24-Nov-1945 Today's Date: 12/25/2012 Time: 4098-1191 PT Time Calculation (min): 23 min  PT Assessment / Plan / Recommendation  History of Present Illness 67 yo female s/p L3-5 PLIF with back brace   PT Comments   Patient continues to make good progress. Educated over back precautions and positioning again. Per PT goals patient is safe to DC home today.   Follow Up Recommendations  No PT follow up;Supervision/Assistance - 24 hour     Does the patient have the potential to tolerate intense rehabilitation     Barriers to Discharge        Equipment Recommendations  Rolling walker with 5" wheels    Recommendations for Other Services    Frequency Min 5X/week   Progress towards PT Goals Progress towards PT goals: Progressing toward goals  Plan Current plan remains appropriate    Precautions / Restrictions Precautions Precautions: Back Precaution Comments: Pt able to recall 3/3 back precautions independently. Required Braces or Orthoses: Spinal Brace Spinal Brace: Lumbar corset   Pertinent Vitals/Pain 2/10 back pain. Patient premedicated    Mobility  Bed Mobility Bed Mobility: Not assessed Transfers Sit to Stand: 6: Modified independent (Device/Increase time) Stand to Sit: 6: Modified independent (Device/Increase time) Ambulation/Gait Ambulation/Gait Assistance: 6: Modified independent (Device/Increase time) Ambulation Distance (Feet): 500 Feet Assistive device: Rolling walker Gait Pattern: Step-through pattern;Decreased stride length Gait velocity: decreased but improving per pt report Stairs: No (Felt confident from previous session)    Exercises     PT Diagnosis:    PT Problem List:   PT Treatment Interventions:     PT Goals (current goals can now be found in the care plan section)    Visit Information  Last PT Received On: 12/25/12 Assistance Needed: +1 History of Present  Illness: 67 yo female s/p L3-5 PLIF with back brace    Subjective Data      Cognition  Cognition Arousal/Alertness: Awake/alert Behavior During Therapy: WFL for tasks assessed/performed Overall Cognitive Status: Within Functional Limits for tasks assessed    Balance     End of Session PT - End of Session Equipment Utilized During Treatment: Back brace Patient left: in chair Nurse Communication: Mobility status   GP     Fredrich Birks 12/25/2012, 10:38 AM 12/25/2012 Fredrich Birks PTA [phone] pager [phone] office

## 2013-03-15 ENCOUNTER — Other Ambulatory Visit: Payer: Self-pay | Admitting: Neurosurgery

## 2013-04-11 ENCOUNTER — Encounter (HOSPITAL_COMMUNITY): Payer: Self-pay | Admitting: Pharmacy Technician

## 2013-04-13 ENCOUNTER — Encounter (HOSPITAL_COMMUNITY): Payer: Self-pay

## 2013-04-13 ENCOUNTER — Encounter (HOSPITAL_COMMUNITY)
Admission: RE | Admit: 2013-04-13 | Discharge: 2013-04-13 | Disposition: A | Discharge status: Home / Self Care | Payer: Medicare HMO | Source: Ambulatory Visit | Attending: Neurosurgery | Admitting: Neurosurgery

## 2013-04-13 DIAGNOSIS — Z01812 Encounter for preprocedural laboratory examination: Secondary | ICD-10-CM | POA: Insufficient documentation

## 2013-04-13 HISTORY — DX: Cardiac murmur, unspecified: R01.1

## 2013-04-13 LAB — TYPE AND SCREEN
ABO/RH(D): AB POS
Antibody Screen: NEGATIVE

## 2013-04-13 LAB — CBC
HCT: 38.9 % (ref 36.0–46.0)
Hemoglobin: 13.2 g/dL (ref 12.0–15.0)
MCH: 27.9 pg (ref 26.0–34.0)
MCHC: 33.9 g/dL (ref 30.0–36.0)
MCV: 82.2 fL (ref 78.0–100.0)
Platelets: 187 10*3/uL (ref 150–400)
RBC: 4.73 MIL/uL (ref 3.87–5.11)
RDW: 14.5 % (ref 11.5–15.5)
WBC: 5 10*3/uL (ref 4.0–10.5)

## 2013-04-13 LAB — SURGICAL PCR SCREEN
MRSA, PCR: NEGATIVE
Staphylococcus aureus: NEGATIVE

## 2013-04-13 LAB — BASIC METABOLIC PANEL
BUN: 13 mg/dL (ref 6–23)
CO2: 31 mEq/L (ref 19–32)
Calcium: 9.1 mg/dL (ref 8.4–10.5)
Chloride: 98 mEq/L (ref 96–112)
Creatinine, Ser: 0.69 mg/dL (ref 0.50–1.10)
GFR calc Af Amer: >90 mL/min (ref >90)
GFR calc non Af Amer: 88 mL/min — ABNORMAL LOW (ref >90)
Glucose, Bld: 87 mg/dL (ref 70–99)
Potassium: 4 mEq/L (ref 3.7–5.3)
Sodium: 140 mEq/L (ref 137–147)

## 2013-04-13 NOTE — Pre-Procedure Instructions (Addendum)
Carrie Austin  04/13/2013   Your procedure is scheduled on:  04/24/23  Report to [facility] short stay admitting at 530 AM.  Call this number if you have problems the morning of surgery: [phone]   Remember:   Do not eat food or drink liquids after midnight.   Take these medicines the morning of surgery with A SIP OF WATER:xanax,pain med, pheneragan if needed,lexapro, gabapentin              STOP all herbel meds, nsaids (aleve,naproxen,advil,ibuprofen) 5 days prior to surgery 04/18/13 including vitamins, cod liver oil, magnesium   Do not wear jewelry, make-up or nail polish.  Do not wear lotions, powders, or perfumes. You may wear deodorant.  Do not shave 48 hours prior to surgery. Men may shave face and neck.  Do not bring valuables to the hospital.  [facility] is not responsible                  for any belongings or valuables.               Contacts, dentures or bridgework may not be worn into surgery.  Leave suitcase in the car. After surgery it may be brought to your room.  For patients admitted to the hospital, discharge time is determined by your                treatment team.               Patients discharged the day of surgery will not be allowed to drive  home.  Name and phone number of your driver:   Special Instructions:  Special Instructions: [facility] - Preparing for Surgery  Before surgery, you can play an important role.  Because skin is not sterile, your skin needs to be as free of germs as possible.  You can reduce the number of germs on you skin by washing with CHG (chlorahexidine gluconate) soap before surgery.  CHG is an antiseptic cleaner which kills germs and bonds with the skin to continue killing germs even after washing.  Please DO NOT use if you have an allergy to CHG or antibacterial soaps.  If your skin becomes reddened/irritated stop using the CHG and inform your nurse when you arrive at Short Stay.  Do not shave (including legs and underarms) for at  least 48 hours prior to the first CHG shower.  You may shave your face.  Please follow these instructions carefully:   1.  Shower with CHG Soap the night before surgery and the morning of Surgery.  2.  If you choose to wash your hair, wash your hair first as usual with your normal shampoo.  3.  After you shampoo, rinse your hair and body thoroughly to remove the Shampoo.  4.  Use CHG as you would any other liquid soap.  You can apply chg directly  to the skin and wash gently with scrungie or a clean washcloth.  5.  Apply the CHG Soap to your body ONLY FROM THE NECK DOWN.  Do not use on open wounds or open sores.  Avoid contact with your eyes ears, mouth and genitals (private parts).  Wash genitals (private parts)       with your normal soap.  6.  Wash thoroughly, paying special attention to the area where your surgery will be performed.  7.  Thoroughly rinse your body with warm water from the neck down.  8.  DO NOT shower/wash  with your normal soap after using and rinsing off the CHG Soap.  9.  Pat yourself dry with a clean towel.            10.  Wear clean pajamas.            11.  Place clean sheets on your bed the night of your first shower and do not sleep with pets.  Day of Surgery  Do not apply any lotions/deodorants the morning of surgery.  Please wear clean clothes to the hospital/surgery center.   Please read over the following fact sheets that you were given: Pain Booklet, Coughing and Deep Breathing, Blood Transfusion Information, MRSA Information and Surgical Site Infection Prevention

## 2013-04-16 NOTE — Progress Notes (Signed)
Anesthesia chart review: Patient is a 68 year old female scheduled for L5-S1 laminectomy with PLIF on 04/23/13 by Dr. Lovell Sheehan.  History includes non-smoker, HTN, anxiety, depression, GERD, headaches, cough, arthritis, murmur ("one time" not otherwise specified), fibromyalgia, L3-5 laminectomies with PLIF 12/11/4, hysterectomy, cholecystectomy, history of vertigo after anesthesia, post-operative N/V. PCP is listed as Dr. Deforest Hoyles.   EKG on 12/14/12 showed NSR, possible LAE, septal infarct (age undetermined), non-specific ST/T wave abnormality. Currently, there is no comparison EKG available. However, she did have a normal stress echo at [facility] on 08/06/11 showing no evidence of ischemia, normal resting LV systolic function, normal BP response to exercise. (Scanned under Media tab, Correspondence encounter 12/21/12.)  CXR on 12/14/12 showed:  1. No acute cardiopulmonary disease.  2. Indeterminate approximately 0.9 cm nodular opacity overlies the left lower lung at while possibly associated with the anterior aspect of the left 7th rib, a discrete underlying pulmonary nodule is not excluded. Correlation with prior examinations (if available) is recommended. Otherwise, further evaluation with chest CT could be performed as clinically indicated.   Chest CT on 12/18/12 showed no evidence of pulmonary neoplasm or other active disease.  Preoperative labs noted.  She tolerated PLIF back in December.  If no acute changes then I anticipate that she can proceed as planned.  Velna Ochs [facility] Short Stay Center/Anesthesiology Phone [phone] 04/16/2013 12:12 PM

## 2013-04-22 MED ORDER — VANCOMYCIN HCL IN DEXTROSE 1-5 GM/200ML-% IV SOLN
1000.0000 mg | INTRAVENOUS | Status: AC
  Administered 2013-04-23: 1000 mg via INTRAVENOUS
  Filled 2013-04-22: qty 200

## 2013-04-23 ENCOUNTER — Inpatient Hospital Stay (HOSPITAL_COMMUNITY)
Admission: RE | Admit: 2013-04-23 | Discharge: 2013-04-26 | DRG: 460 | Disposition: A | Discharge status: Home / Self Care | Payer: Medicare HMO | Source: Ambulatory Visit | Attending: Neurosurgery | Admitting: Neurosurgery

## 2013-04-23 ENCOUNTER — Inpatient Hospital Stay (HOSPITAL_COMMUNITY): Payer: Medicare HMO | Admitting: Certified Registered"

## 2013-04-23 ENCOUNTER — Encounter (HOSPITAL_COMMUNITY): Payer: Medicare HMO | Admitting: Vascular Surgery

## 2013-04-23 ENCOUNTER — Encounter (HOSPITAL_COMMUNITY): Payer: Self-pay | Admitting: Anesthesiology

## 2013-04-23 ENCOUNTER — Encounter (HOSPITAL_COMMUNITY)
Admission: RE | Disposition: A | Discharge status: Home / Self Care | Payer: Medicare HMO | Source: Ambulatory Visit | Attending: Neurosurgery

## 2013-04-23 ENCOUNTER — Inpatient Hospital Stay (HOSPITAL_COMMUNITY): Payer: Medicare HMO

## 2013-04-23 DIAGNOSIS — Z79899 Other long term (current) drug therapy: Secondary | ICD-10-CM

## 2013-04-23 DIAGNOSIS — M4316 Spondylolisthesis, lumbar region: Secondary | ICD-10-CM

## 2013-04-23 DIAGNOSIS — M5137 Other intervertebral disc degeneration, lumbosacral region: Secondary | ICD-10-CM | POA: Diagnosis present

## 2013-04-23 DIAGNOSIS — M4310 Spondylolisthesis, site unspecified: Secondary | ICD-10-CM | POA: Diagnosis present

## 2013-04-23 DIAGNOSIS — K219 Gastro-esophageal reflux disease without esophagitis: Secondary | ICD-10-CM | POA: Diagnosis present

## 2013-04-23 DIAGNOSIS — M51379 Other intervertebral disc degeneration, lumbosacral region without mention of lumbar back pain or lower extremity pain: Secondary | ICD-10-CM | POA: Diagnosis present

## 2013-04-23 DIAGNOSIS — E876 Hypokalemia: Secondary | ICD-10-CM | POA: Diagnosis not present

## 2013-04-23 DIAGNOSIS — I10 Essential (primary) hypertension: Secondary | ICD-10-CM | POA: Diagnosis present

## 2013-04-23 DIAGNOSIS — M479 Spondylosis, unspecified: Secondary | ICD-10-CM | POA: Diagnosis present

## 2013-04-23 DIAGNOSIS — IMO0001 Reserved for inherently not codable concepts without codable children: Secondary | ICD-10-CM | POA: Diagnosis present

## 2013-04-23 SURGERY — POSTERIOR LUMBAR FUSION 1 LEVEL
Anesthesia: General

## 2013-04-23 MED ORDER — LOSARTAN POTASSIUM 50 MG PO TABS
100.0000 mg | ORAL_TABLET | Freq: Every day | ORAL | Status: DC
  Administered 2013-04-23 – 2013-04-25 (×3): 100 mg via ORAL
  Filled 2013-04-23 (×4): qty 2

## 2013-04-23 MED ORDER — VANCOMYCIN HCL IN DEXTROSE 750-5 MG/150ML-% IV SOLN
750.0000 mg | INTRAVENOUS | Status: DC
  Administered 2013-04-23 – 2013-04-24 (×2): 750 mg via INTRAVENOUS
  Filled 2013-04-23 (×4): qty 150

## 2013-04-23 MED ORDER — 0.9 % SODIUM CHLORIDE (POUR BTL) OPTIME
TOPICAL | Status: DC | PRN
  Administered 2013-04-23: 1000 mL

## 2013-04-23 MED ORDER — ROCURONIUM BROMIDE 100 MG/10ML IV SOLN
INTRAVENOUS | Status: DC | PRN
  Administered 2013-04-23 (×2): 20 mg via INTRAVENOUS
  Administered 2013-04-23: 10 mg via INTRAVENOUS
  Administered 2013-04-23: 20 mg via INTRAVENOUS
  Administered 2013-04-23: 30 mg via INTRAVENOUS

## 2013-04-23 MED ORDER — BACITRACIN ZINC 500 UNIT/GM EX OINT
TOPICAL_OINTMENT | CUTANEOUS | Status: DC | PRN
  Administered 2013-04-23: 1 via TOPICAL

## 2013-04-23 MED ORDER — ROCURONIUM BROMIDE 50 MG/5ML IV SOLN
INTRAVENOUS | Status: AC
  Filled 2013-04-23: qty 1

## 2013-04-23 MED ORDER — LIDOCAINE HCL (CARDIAC) 20 MG/ML IV SOLN
INTRAVENOUS | Status: DC | PRN
  Administered 2013-04-23: 100 mg via INTRAVENOUS

## 2013-04-23 MED ORDER — MIDAZOLAM HCL 2 MG/2ML IJ SOLN
INTRAMUSCULAR | Status: AC
  Filled 2013-04-23: qty 2

## 2013-04-23 MED ORDER — GLYCOPYRROLATE 0.2 MG/ML IJ SOLN
INTRAMUSCULAR | Status: DC | PRN
  Administered 2013-04-23: 0.2 mg via INTRAVENOUS
  Administered 2013-04-23: 0.4 mg via INTRAVENOUS

## 2013-04-23 MED ORDER — ALUM & MAG HYDROXIDE-SIMETH 200-200-20 MG/5ML PO SUSP: 30.0000 mL | Freq: Four times a day (QID) | ORAL | Status: DC | PRN

## 2013-04-23 MED ORDER — GABAPENTIN 300 MG PO CAPS
300.0000 mg | ORAL_CAPSULE | Freq: Three times a day (TID) | ORAL | Status: DC
  Administered 2013-04-23 – 2013-04-26 (×9): 300 mg via ORAL
  Filled 2013-04-23 (×11): qty 1

## 2013-04-23 MED ORDER — DOCUSATE SODIUM 100 MG PO CAPS
100.0000 mg | ORAL_CAPSULE | Freq: Two times a day (BID) | ORAL | Status: DC
  Administered 2013-04-23 – 2013-04-26 (×5): 100 mg via ORAL
  Filled 2013-04-23 (×5): qty 1

## 2013-04-23 MED ORDER — PHENYLEPHRINE HCL 10 MG/ML IJ SOLN
10.0000 mg | INTRAMUSCULAR | Status: DC | PRN
  Administered 2013-04-23: 50 ug/min via INTRAVENOUS

## 2013-04-23 MED ORDER — BUPIVACAINE-EPINEPHRINE PF 0.5-1:200000 % IJ SOLN
INTRAMUSCULAR | Status: DC | PRN
  Administered 2013-04-23: 10 mL via PERINEURAL

## 2013-04-23 MED ORDER — LIDOCAINE HCL 4 % MT SOLN
OROMUCOSAL | Status: DC | PRN
  Administered 2013-04-23: 4 mL via TOPICAL

## 2013-04-23 MED ORDER — GLYCOPYRROLATE 0.2 MG/ML IJ SOLN
INTRAMUSCULAR | Status: AC
  Filled 2013-04-23: qty 2

## 2013-04-23 MED ORDER — SCOPOLAMINE 1 MG/3DAYS TD PT72
MEDICATED_PATCH | TRANSDERMAL | Status: DC | PRN
  Administered 2013-04-23: 1 via TRANSDERMAL

## 2013-04-23 MED ORDER — OXYCODONE-ACETAMINOPHEN 5-325 MG PO TABS: 1.0000 | ORAL_TABLET | ORAL | Status: DC | PRN

## 2013-04-23 MED ORDER — SODIUM CHLORIDE 0.9 % IJ SOLN
INTRAMUSCULAR | Status: AC
  Filled 2013-04-23: qty 10

## 2013-04-23 MED ORDER — ACETAMINOPHEN 650 MG RE SUPP: 650.0000 mg | RECTAL | Status: DC | PRN

## 2013-04-23 MED ORDER — PHENYLEPHRINE 40 MCG/ML (10ML) SYRINGE FOR IV PUSH (FOR BLOOD PRESSURE SUPPORT)
PREFILLED_SYRINGE | INTRAVENOUS | Status: AC
  Filled 2013-04-23: qty 20

## 2013-04-23 MED ORDER — ONDANSETRON HCL 4 MG/2ML IJ SOLN: 4.0000 mg | INTRAMUSCULAR | Status: DC | PRN

## 2013-04-23 MED ORDER — HYDROMORPHONE HCL PF 1 MG/ML IJ SOLN
0.2500 mg | INTRAMUSCULAR | Status: DC | PRN
  Administered 2013-04-23 (×2): 0.5 mg via INTRAVENOUS

## 2013-04-23 MED ORDER — PHENYLEPHRINE 40 MCG/ML (10ML) SYRINGE FOR IV PUSH (FOR BLOOD PRESSURE SUPPORT)
PREFILLED_SYRINGE | INTRAVENOUS | Status: AC
  Filled 2013-04-23: qty 10

## 2013-04-23 MED ORDER — PROMETHAZINE HCL 25 MG PO TABS: 12.5000 mg | ORAL_TABLET | Freq: Four times a day (QID) | ORAL | Status: DC | PRN

## 2013-04-23 MED ORDER — OXYCODONE-ACETAMINOPHEN 10-325 MG PO TABS: 1.0000 | ORAL_TABLET | ORAL | Status: DC | PRN

## 2013-04-23 MED ORDER — SCOPOLAMINE 1 MG/3DAYS TD PT72
MEDICATED_PATCH | TRANSDERMAL | Status: AC
  Filled 2013-04-23: qty 1

## 2013-04-23 MED ORDER — ONDANSETRON HCL 4 MG/2ML IJ SOLN
INTRAMUSCULAR | Status: DC | PRN
  Administered 2013-04-23: 4 mg via INTRAVENOUS

## 2013-04-23 MED ORDER — ACETAMINOPHEN 325 MG PO TABS: 650.0000 mg | ORAL_TABLET | ORAL | Status: DC | PRN

## 2013-04-23 MED ORDER — THROMBIN 20000 UNITS EX SOLR
CUTANEOUS | Status: DC | PRN
  Administered 2013-04-23: 07:00:00 via TOPICAL

## 2013-04-23 MED ORDER — DEXAMETHASONE SODIUM PHOSPHATE 4 MG/ML IJ SOLN
INTRAMUSCULAR | Status: AC
  Filled 2013-04-23: qty 1

## 2013-04-23 MED ORDER — LACTATED RINGERS IV SOLN
INTRAVENOUS | Status: DC | PRN
  Administered 2013-04-23 (×3): via INTRAVENOUS

## 2013-04-23 MED ORDER — OXYCODONE HCL 5 MG PO TABS
5.0000 mg | ORAL_TABLET | ORAL | Status: DC | PRN
  Administered 2013-04-23 – 2013-04-24 (×6): 5 mg via ORAL
  Filled 2013-04-23 (×6): qty 1

## 2013-04-23 MED ORDER — FENTANYL CITRATE 0.05 MG/ML IJ SOLN
INTRAMUSCULAR | Status: DC | PRN
Start: 2013-04-23 — End: 2013-04-23
  Administered 2013-04-23: 50 ug via INTRAVENOUS
  Administered 2013-04-23: 100 ug via INTRAVENOUS

## 2013-04-23 MED ORDER — MAGNESIUM OXIDE 400 (241.3 MG) MG PO TABS
400.0000 mg | ORAL_TABLET | Freq: Every day | ORAL | Status: DC
  Administered 2013-04-23 – 2013-04-26 (×4): 400 mg via ORAL
  Filled 2013-04-23 (×4): qty 1

## 2013-04-23 MED ORDER — NEOSTIGMINE METHYLSULFATE 1 MG/ML IJ SOLN
INTRAMUSCULAR | Status: AC
  Filled 2013-04-23: qty 10

## 2013-04-23 MED ORDER — EPHEDRINE SULFATE 50 MG/ML IJ SOLN
INTRAMUSCULAR | Status: DC | PRN
  Administered 2013-04-23: 10 mg via INTRAVENOUS
  Administered 2013-04-23: 5 mg via INTRAVENOUS
  Administered 2013-04-23: 10 mg via INTRAVENOUS
  Administered 2013-04-23: 15 mg via INTRAVENOUS

## 2013-04-23 MED ORDER — ONDANSETRON HCL 4 MG/2ML IJ SOLN: 4.0000 mg | Freq: Once | INTRAMUSCULAR | Status: DC | PRN

## 2013-04-23 MED ORDER — DEXAMETHASONE SODIUM PHOSPHATE 4 MG/ML IJ SOLN
INTRAMUSCULAR | Status: DC | PRN
  Administered 2013-04-23: 4 mg via INTRAVENOUS

## 2013-04-23 MED ORDER — ATORVASTATIN CALCIUM 20 MG PO TABS
20.0000 mg | ORAL_TABLET | ORAL | Status: DC
  Administered 2013-04-25: 20 mg via ORAL
  Filled 2013-04-23: qty 1

## 2013-04-23 MED ORDER — MORPHINE SULFATE 2 MG/ML IJ SOLN
1.0000 mg | INTRAMUSCULAR | Status: DC | PRN
  Administered 2013-04-23 (×2): 2 mg via INTRAVENOUS
  Filled 2013-04-23 (×2): qty 1

## 2013-04-23 MED ORDER — HYDROMORPHONE HCL PF 1 MG/ML IJ SOLN
INTRAMUSCULAR | Status: AC
  Filled 2013-04-23: qty 1

## 2013-04-23 MED ORDER — MENTHOL 3 MG MT LOZG: 1.0000 | LOZENGE | OROMUCOSAL | Status: DC | PRN

## 2013-04-23 MED ORDER — LIDOCAINE HCL (CARDIAC) 20 MG/ML IV SOLN
INTRAVENOUS | Status: AC
  Filled 2013-04-23: qty 5

## 2013-04-23 MED ORDER — BACITRACIN 50000 UNITS IM SOLR
INTRAMUSCULAR | Status: DC | PRN
  Administered 2013-04-23: 07:00:00

## 2013-04-23 MED ORDER — BUPIVACAINE LIPOSOME 1.3 % IJ SUSP
INTRAMUSCULAR | Status: DC | PRN
  Administered 2013-04-23: 20 mL

## 2013-04-23 MED ORDER — NEOSTIGMINE METHYLSULFATE 1 MG/ML IJ SOLN
INTRAMUSCULAR | Status: DC | PRN
  Administered 2013-04-23: 3 mg via INTRAVENOUS

## 2013-04-23 MED ORDER — ESCITALOPRAM OXALATE 20 MG PO TABS
20.0000 mg | ORAL_TABLET | Freq: Every day | ORAL | Status: DC
  Administered 2013-04-23 – 2013-04-26 (×4): 20 mg via ORAL
  Filled 2013-04-23 (×4): qty 1

## 2013-04-23 MED ORDER — DIAZEPAM 5 MG PO TABS
5.0000 mg | ORAL_TABLET | Freq: Four times a day (QID) | ORAL | Status: DC | PRN
  Administered 2013-04-23 – 2013-04-25 (×4): 5 mg via ORAL
  Filled 2013-04-23 (×4): qty 1

## 2013-04-23 MED ORDER — ARTIFICIAL TEARS OP OINT
TOPICAL_OINTMENT | OPHTHALMIC | Status: AC
  Filled 2013-04-23: qty 3.5

## 2013-04-23 MED ORDER — BUPIVACAINE LIPOSOME 1.3 % IJ SUSP
20.0000 mL | INTRAMUSCULAR | Status: AC
  Filled 2013-04-23: qty 20

## 2013-04-23 MED ORDER — PHENYLEPHRINE HCL 10 MG/ML IJ SOLN
INTRAMUSCULAR | Status: DC | PRN
  Administered 2013-04-23: 120 ug via INTRAVENOUS
  Administered 2013-04-23: 100 ug via INTRAVENOUS
  Administered 2013-04-23: 200 ug via INTRAVENOUS
  Administered 2013-04-23: 80 ug via INTRAVENOUS
  Administered 2013-04-23: 200 ug via INTRAVENOUS
  Administered 2013-04-23: 80 ug via INTRAVENOUS
  Administered 2013-04-23: 120 ug via INTRAVENOUS
  Administered 2013-04-23: 200 ug via INTRAVENOUS
  Administered 2013-04-23: 100 ug via INTRAVENOUS

## 2013-04-23 MED ORDER — EPHEDRINE SULFATE 50 MG/ML IJ SOLN
INTRAMUSCULAR | Status: AC
  Filled 2013-04-23: qty 1

## 2013-04-23 MED ORDER — OXYCODONE-ACETAMINOPHEN 5-325 MG PO TABS
1.0000 | ORAL_TABLET | ORAL | Status: DC | PRN
  Administered 2013-04-23 – 2013-04-26 (×10): 1 via ORAL
  Filled 2013-04-23 (×10): qty 1

## 2013-04-23 MED ORDER — PROPOFOL 10 MG/ML IV BOLUS
INTRAVENOUS | Status: AC
  Filled 2013-04-23: qty 20

## 2013-04-23 MED ORDER — DOCUSATE SODIUM 100 MG PO CAPS: 100.0000 mg | ORAL_CAPSULE | Freq: Two times a day (BID) | ORAL | Status: DC

## 2013-04-23 MED ORDER — LACTATED RINGERS IV SOLN
INTRAVENOUS | Status: DC
  Administered 2013-04-23 – 2013-04-24 (×2): via INTRAVENOUS

## 2013-04-23 MED ORDER — AMITRIPTYLINE HCL 75 MG PO TABS
75.0000 mg | ORAL_TABLET | Freq: Every day | ORAL | Status: DC
  Administered 2013-04-23 – 2013-04-25 (×3): 75 mg via ORAL
  Filled 2013-04-23 (×4): qty 1

## 2013-04-23 MED ORDER — PHENOL 1.4 % MT LIQD: 1.0000 | OROMUCOSAL | Status: DC | PRN

## 2013-04-23 MED ORDER — HYDROCODONE-ACETAMINOPHEN 5-325 MG PO TABS
1.0000 | ORAL_TABLET | ORAL | Status: DC | PRN
  Administered 2013-04-25 (×2): 1 via ORAL
  Filled 2013-04-23 (×2): qty 1

## 2013-04-23 MED ORDER — PROPOFOL 10 MG/ML IV BOLUS
INTRAVENOUS | Status: DC | PRN
  Administered 2013-04-23: 200 mg via INTRAVENOUS

## 2013-04-23 MED ORDER — ARTIFICIAL TEARS OP OINT
TOPICAL_OINTMENT | OPHTHALMIC | Status: DC | PRN
  Administered 2013-04-23: 1 via OPHTHALMIC

## 2013-04-23 MED ORDER — ONDANSETRON HCL 4 MG/2ML IJ SOLN
INTRAMUSCULAR | Status: AC
  Filled 2013-04-23: qty 2

## 2013-04-23 MED ORDER — ADULT MULTIVITAMIN W/MINERALS CH
1.0000 | ORAL_TABLET | Freq: Every day | ORAL | Status: DC
  Administered 2013-04-23 – 2013-04-26 (×4): 1 via ORAL
  Filled 2013-04-23 (×4): qty 1

## 2013-04-23 MED ORDER — FENTANYL CITRATE 0.05 MG/ML IJ SOLN
INTRAMUSCULAR | Status: AC
  Filled 2013-04-23: qty 5

## 2013-04-23 MED FILL — Heparin Sodium (Porcine) Inj 1000 Unit/ML: INTRAMUSCULAR | Qty: 30 | Status: AC

## 2013-04-23 MED FILL — Sodium Chloride IV Soln 0.9%: INTRAVENOUS | Qty: 1000 | Status: AC

## 2013-04-23 SURGICAL SUPPLY — 72 items
BAG DECANTER FOR FLEXI CONT (MISCELLANEOUS) ×2 IMPLANT
BENZOIN TINCTURE PRP APPL 2/3 (GAUZE/BANDAGES/DRESSINGS) ×2 IMPLANT
BLADE SURG ROTATE 9660 (MISCELLANEOUS) IMPLANT
BRUSH SCRUB EZ PLAIN DRY (MISCELLANEOUS) ×2 IMPLANT
BUR ACORN 6.0 (BURR) ×2 IMPLANT
BUR MATCHSTICK NEURO 3.0 LAGG (BURR) ×2 IMPLANT
CANISTER SUCT 3000ML (MISCELLANEOUS) ×2 IMPLANT
CAP REVERE LOCKING (Cap) ×16 IMPLANT
CONN CROSSLINK REV 6.35 48-60 (Connector) ×2 IMPLANT
CONNECTOR CRSLNK REV6.35 48-60 (Connector) ×1 IMPLANT
CONT SPEC 4OZ CLIKSEAL STRL BL (MISCELLANEOUS) ×2 IMPLANT
COVER BACK TABLE 24X17X13 BIG (DRAPES) IMPLANT
COVER TABLE BACK 60X90 (DRAPES) ×2 IMPLANT
DRAPE C-ARM 42X72 X-RAY (DRAPES) ×4 IMPLANT
DRAPE LAPAROTOMY 100X72X124 (DRAPES) ×2 IMPLANT
DRAPE POUCH INSTRU U-SHP 10X18 (DRAPES) ×2 IMPLANT
DRAPE PROXIMA HALF (DRAPES) ×2 IMPLANT
DRAPE SURG 17X23 STRL (DRAPES) ×8 IMPLANT
ELECT BLADE 4.0 EZ CLEAN MEGAD (MISCELLANEOUS) ×2
ELECT REM PT RETURN 9FT ADLT (ELECTROSURGICAL) ×2
ELECTRODE BLDE 4.0 EZ CLN MEGD (MISCELLANEOUS) ×1 IMPLANT
ELECTRODE REM PT RTRN 9FT ADLT (ELECTROSURGICAL) ×1 IMPLANT
EVACUATOR 1/8 PVC DRAIN (DRAIN) ×2 IMPLANT
GAUZE SPONGE 4X4 16PLY XRAY LF (GAUZE/BANDAGES/DRESSINGS) ×2 IMPLANT
GLOVE BIO SURGEON STRL SZ7.5 (GLOVE) ×4 IMPLANT
GLOVE BIO SURGEON STRL SZ8.5 (GLOVE) ×4 IMPLANT
GLOVE ECLIPSE 8.5 STRL (GLOVE) ×2 IMPLANT
GLOVE EXAM NITRILE LRG STRL (GLOVE) ×4 IMPLANT
GLOVE EXAM NITRILE MD LF STRL (GLOVE) IMPLANT
GLOVE EXAM NITRILE XL STR (GLOVE) IMPLANT
GLOVE EXAM NITRILE XS STR PU (GLOVE) IMPLANT
GLOVE INDICATOR 7.5 STRL GRN (GLOVE) ×4 IMPLANT
GLOVE INDICATOR 8.5 STRL (GLOVE) ×2 IMPLANT
GLOVE OPTIFIT SS 7.5 STRL LX (GLOVE) ×4 IMPLANT
GLOVE SS BIOGEL STRL SZ 8 (GLOVE) ×2 IMPLANT
GLOVE SUPERSENSE BIOGEL SZ 8 (GLOVE) ×2
GOWN BRE IMP SLV AUR LG STRL (GOWN DISPOSABLE) IMPLANT
GOWN BRE IMP SLV AUR XL STRL (GOWN DISPOSABLE) IMPLANT
GOWN SPEC L3 XXLG W/TWL (GOWN DISPOSABLE) ×2 IMPLANT
GOWN STRL REIN 2XL LVL4 (GOWN DISPOSABLE) IMPLANT
GOWN STRL REUS W/ TWL XL LVL3 (GOWN DISPOSABLE) ×1 IMPLANT
GOWN STRL REUS W/TWL MED LVL3 (GOWN DISPOSABLE) ×2 IMPLANT
GOWN STRL REUS W/TWL XL LVL3 (GOWN DISPOSABLE) ×1
KIT BASIN OR (CUSTOM PROCEDURE TRAY) ×2 IMPLANT
KIT ROOM TURNOVER OR (KITS) ×2 IMPLANT
NEEDLE HYPO 21X1.5 SAFETY (NEEDLE) IMPLANT
NEEDLE HYPO 22GX1.5 SAFETY (NEEDLE) ×2 IMPLANT
NS IRRIG 1000ML POUR BTL (IV SOLUTION) ×2 IMPLANT
PACK FOAM VITOSS 10CC (Orthopedic Implant) ×2 IMPLANT
PACK LAMINECTOMY NEURO (CUSTOM PROCEDURE TRAY) ×2 IMPLANT
PAD ARMBOARD 7.5X6 YLW CONV (MISCELLANEOUS) ×6 IMPLANT
PATTIES SURGICAL .5 X.5 (GAUZE/BANDAGES/DRESSINGS) ×2 IMPLANT
PATTIES SURGICAL .5 X1 (DISPOSABLE) IMPLANT
PUTTY 10ML ACTIFUSE ABX (Putty) ×2 IMPLANT
ROD REVERE 6.35 CURVED 85MM (Rod) ×4 IMPLANT
SCREW 7.5X45MM (Screw) ×4 IMPLANT
SCREW REVERE 6.35 7.5X40 (Screw) ×4 IMPLANT
SPACER SUSTAIN O SML 10X22 12M (Spacer) ×4 IMPLANT
SPONGE GAUZE 4X4 12PLY (GAUZE/BANDAGES/DRESSINGS) ×2 IMPLANT
SPONGE LAP 4X18 X RAY DECT (DISPOSABLE) IMPLANT
SPONGE NEURO XRAY DETECT 1X3 (DISPOSABLE) IMPLANT
SPONGE SURGIFOAM ABS GEL 100 (HEMOSTASIS) ×2 IMPLANT
STRIP CLOSURE SKIN 1/2X4 (GAUZE/BANDAGES/DRESSINGS) ×2 IMPLANT
SUT VIC AB 1 CT1 18XBRD ANBCTR (SUTURE) ×3 IMPLANT
SUT VIC AB 1 CT1 8-18 (SUTURE) ×3
SUT VIC AB 2-0 CP2 18 (SUTURE) ×4 IMPLANT
SYR 20CC LL (SYRINGE) ×2 IMPLANT
SYR 20ML ECCENTRIC (SYRINGE) ×2 IMPLANT
TOWEL OR 17X24 6PK STRL BLUE (TOWEL DISPOSABLE) ×2 IMPLANT
TOWEL OR 17X26 10 PK STRL BLUE (TOWEL DISPOSABLE) ×2 IMPLANT
TRAY FOLEY CATH 14FRSI W/METER (CATHETERS) ×2 IMPLANT
WATER STERILE IRR 1000ML POUR (IV SOLUTION) ×2 IMPLANT

## 2013-04-23 NOTE — Progress Notes (Signed)
Subjective:  The patient is alert and pleasant. She is in no apparent distress. She looks well.  Objective: Vital signs in last 24 hours: Temp:  [97.5 F (36.4 C)-98.2 F (36.8 C)] 98.2 F (36.8 C) (04/13 1220) Pulse Rate:  [69-95] 86 (04/13 1230) Resp:  [13-22] 22 (04/13 1230) BP: (130-134)/(55-58) 130/55 mmHg (04/13 1220) SpO2:  [97 %-100 %] 100 % (04/13 1230)  Intake/Output from previous day:   Intake/Output this shift: Total I/O In: 2000 [I.V.:2000] Out: 325 [Urine:225; Blood:100]  Physical exam the patient is alert and pleasant. She is moving her lower extremities well.  Lab Results: No results found for this basename: WBC, HGB, HCT, PLT,  in the last 72 hours BMET No results found for this basename: NA, K, CL, CO2, GLUCOSE, BUN, CREATININE, CALCIUM,  in the last 72 hours  Studies/Results: No results found.  Assessment/Plan: Patient is doing well.  LOS: 0 days     [name] 04/23/2013, 12:44 PM

## 2013-04-23 NOTE — Transfer of Care (Signed)
Immediate Anesthesia Transfer of Care Note  Patient: Carrie Austin  Procedure(s) Performed: Procedure(s): LUMBAR FIVE SACRAL ONE LAMINECTOMY POSTERIOR LUMBAR INTERBODY FUSION POSTERIOR LATERAL ARTHRODESIS POSTERIOR NONSEGMENTAL INSTRUMENTATION (N/A)  Patient Location: PACU  Anesthesia Type:General  Level of Consciousness: awake, alert  and oriented  Airway & Oxygen Therapy: Patient Spontanous Breathing and Patient connected to nasal cannula oxygen  Post-op Assessment: Report given to PACU RN and Patient moving all extremities X 4  Post vital signs: Reviewed and stable  Complications: No apparent anesthesia complications

## 2013-04-23 NOTE — H&P (Signed)
Subjective: The patient is a 68 year old white female on whom I performed a lumbar fusion. She has initially done well but developed recurrent back, buttock, and leg pain consistent with neurogenic claudication. She has failed medical management and was worked up with x-rays which demonstrated a progressive spondylolisthesis at L5-S1. We discussed the various treatment options including surgery. She has weighed the risks, benefits, and alternatives surgery and decided proceed with a L5-S1 decompression, instrumentation, and fusion.   Past Medical History  Diagnosis Date  . PONV (postoperative nausea and vomiting)     hx of Vertigo after anesthesia  . Hypertension   . Anxiety   . Depression   . Cough     due allergies chronic  . GERD (gastroesophageal reflux disease)   . Headache(784.0)   . Arthritis   . Fibromyalgia   . Heart murmur     one time    Past Surgical History  Procedure Laterality Date  . Abdominal hysterectomy    . Cholecystectomy    . Colonoscopy    . Eye surgery Bilateral     lasik  . Back surgery  12/21/12    Allergies  Allergen Reactions  . Cephalosporins     Flushing of the face  . Lisinopril     Deep cough  . Amoxicillin Rash    History  Substance Use Topics  . Smoking status: Never Smoker   . Smokeless tobacco: Never Used  . Alcohol Use: No     Comment: social    No family history on file. Prior to Admission medications   Medication Sig Start Date End Date Taking? Authorizing Provider  ALPRAZolam Prudy Feeler) 0.5 MG tablet Take 0.5 mg by mouth 2 (two) times daily.   Yes Historical Provider, MD  amitriptyline (ELAVIL) 25 MG tablet Take 75 mg by mouth at bedtime.   Yes Historical Provider, MD  atorvastatin (LIPITOR) 20 MG tablet Take 20 mg by mouth once a week.   Yes Historical Provider, MD  Calcium Carb-Cholecalciferol (CALCIUM 600 + D PO) Take 1 tablet by mouth daily.   Yes Historical Provider, MD  Cholecalciferol (VITAMIN D) 2000 UNITS CAPS Take  2,000 Units by mouth daily.   Yes Historical Provider, MD  clobetasol ointment (TEMOVATE) 0.05 % Apply 1 application topically 2 (two) times daily as needed (for rash).   Yes Historical Provider, MD  COD LIVER OIL PO Take 5 mLs by mouth daily.   Yes Historical Provider, MD  docusate sodium 100 MG CAPS Take 100 mg by mouth 2 (two) times daily. 12/25/12  Yes [name], MD  escitalopram (LEXAPRO) 20 MG tablet Take 20 mg by mouth daily.   Yes Historical Provider, MD  gabapentin (NEURONTIN) 300 MG capsule Take 300 mg by mouth 3 (three) times daily.   Yes Historical Provider, MD  hydrocortisone 2.5 % cream Apply 1 application topically 2 (two) times daily.   Yes Historical Provider, MD  losartan (COZAAR) 100 MG tablet Take 100 mg by mouth daily.   Yes Historical Provider, MD  magnesium oxide (MAG-OX) 400 MG tablet Take 400 mg by mouth daily.   Yes Historical Provider, MD  Multiple Vitamins-Minerals (MULTIVITAMIN WITH MINERALS) tablet Take 1 tablet by mouth daily.   Yes Historical Provider, MD  oxyCODONE-acetaminophen (PERCOCET) 10-325 MG per tablet Take 1 tablet by mouth every 4 (four) hours as needed for pain. 12/25/12  Yes [name], MD  promethazine (PHENERGAN) 12.5 MG tablet Take 1 tablet (12.5 mg total) by mouth every 6 (  six) hours as needed for nausea or vomiting. 12/25/12  Yes [name], MD     Review of Systems  Positive ROS: As above  All other systems have been reviewed and were otherwise negative with the exception of those mentioned in the HPI and as above.  Objective: Vital signs in last 24 hours: Temp:  [97.5 F (36.4 C)] 97.5 F (36.4 C) (04/13 8546) Pulse Rate:  [69] 69 (04/13 0623) Resp:  [18] 18 (04/13 0623) BP: (134)/(58) 134/58 mmHg (04/13 0623) SpO2:  [97 %] 97 % (04/13 0623)  General Appearance: Alert, cooperative, no distress, Head: Normocephalic, without obvious abnormality, atraumatic Eyes: PERRL, conjunctiva/corneas clear, EOM's intact,     Ears: Normal  Throat: Normal  Neck: Supple, symmetrical, trachea midline, no adenopathy; thyroid: No enlargement/tenderness/nodules; no carotid bruit or JVD Back: Symmetric, no curvature, ROM normal, no CVA tenderness, the patient's lumbar incision is well-healed. Lungs: Clear to auscultation bilaterally, respirations unlabored Heart: Regular rate and rhythm, no murmur, rub or gallop Abdomen: Soft, non-tender,, no masses, no organomegaly Extremities: Extremities normal, atraumatic, no cyanosis or edema Pulses: 2+ and symmetric all extremities Skin: Skin color, texture, turgor normal, no rashes or lesions  NEUROLOGIC:   Mental status: alert and oriented, no aphasia, good attention span, Fund of knowledge/ memory ok Motor Exam - grossly normal Sensory Exam - grossly normal Reflexes:  Coordination - grossly normal Gait - grossly normal Balance - grossly normal Cranial Nerves: I: smell Not tested  II: visual acuity  OS: Normal  OD: Normal   II: visual fields Full to confrontation  II: pupils Equal, round, reactive to light  III,VII: ptosis None  III,IV,VI: extraocular muscles  Full ROM  V: mastication Normal  V: facial light touch sensation  Normal  V,VII: corneal reflex  Present  VII: facial muscle function - upper  Normal  VII: facial muscle function - lower Normal  VIII: hearing Not tested  IX: soft palate elevation  Normal  IX,X: gag reflex Present  XI: trapezius strength  5/5  XI: sternocleidomastoid strength 5/5  XI: neck flexion strength  5/5  XII: tongue strength  Normal    Data Review Lab Results  Component Value Date   WBC 5.0 04/13/2013   HGB 13.2 04/13/2013   HCT 38.9 04/13/2013   MCV 82.2 04/13/2013   PLT 187 04/13/2013   Lab Results  Component Value Date   NA 140 04/13/2013   K 4.0 04/13/2013   CL 98 04/13/2013   CO2 31 04/13/2013   BUN 13 04/13/2013   CREATININE 0.69 04/13/2013   GLUCOSE 87 04/13/2013   No results found for this basename: INR, PROTIME     Assessment/Plan: L5-S1 spondylolisthesis, spinal stenosis, lumbago, lumbar radiculopathy, neurogenic claudication: I discussed situation with the patient. I have reviewed her imaging studies with her and pointed out the abnormalities. We have discussed the various treatment options including surgery. I described the surgical treatment option of a L5-S1 decompression, instrumentation, and fusion. I've shown her surgical models. We have discussed the risks, benefits, alternatives, and likelihood of achieving our goals with surgery. I have answered all the patient's questions. She has decided to proceed with surgery.   [name] 04/23/2013 7:20 AM

## 2013-04-23 NOTE — Progress Notes (Signed)
Utilization review completed.  

## 2013-04-23 NOTE — Progress Notes (Signed)
Orthopedic Tech Progress Note Patient Details:  RAYNE COWDREY May 12, 1945 [id number] Called [employer] for brace order. Patient ID: SHENEA GIACOBBE, female   DOB: [birth year], 68 y.o.   MRN: [id number]   Jennye Moccasin 04/23/2013, 3:40 PM

## 2013-04-23 NOTE — Progress Notes (Signed)
ANTIBIOTIC CONSULT NOTE - INITIAL  Pharmacy Consult for Vancomycin Indication: post op spinal surgery  Allergies  Allergen Reactions  . Cephalosporins     Flushing of the face  . Lisinopril     Deep cough  . Amoxicillin Rash    Patient Measurements:   Adjusted Body Weight: 43kg  Vital Signs: Temp: 97.5 F (36.4 C) (04/13 1330) BP: 128/62 mmHg (04/13 1319) Pulse Rate: 88 (04/13 1333) Intake/Output from previous day:   Intake/Output from this shift: Total I/O In: 2000 [I.V.:2000] Out: 825 [Urine:675; Drains:50; Blood:100]  Labs: No results found for this basename: WBC, HGB, PLT, LABCREA, CREATININE,  in the last 72 hours The CrCl is unknown because both a height and weight (above a minimum accepted value) are required for this calculation. No results found for this basename: VANCOTROUGH, Leodis Binet, VANCORANDOM, GENTTROUGH, GENTPEAK, GENTRANDOM, TOBRATROUGH, TOBRAPEAK, TOBRARND, AMIKACINPEAK, AMIKACINTROU, AMIKACIN,  in the last 72 hours   Microbiology: Recent Results (from the past 720 hour(s))  SURGICAL PCR SCREEN     Status: None   Collection Time    04/13/13 11:19 AM      Result Value Ref Range Status   MRSA, PCR NEGATIVE  NEGATIVE Final   Staphylococcus aureus NEGATIVE  NEGATIVE Final   Comment:            The Xpert SA Assay (FDA     approved for NASAL specimens     in patients over 55 years of age),     is one component of     a comprehensive surveillance     program.  Test performance has     been validated by [employer] for patients greater     than or equal to 29 year old.     It is not intended     to diagnose infection nor to     guide or monitor treatment.    Medical History: Past Medical History  Diagnosis Date  . PONV (postoperative nausea and vomiting)     hx of Vertigo after anesthesia  . Hypertension   . Anxiety   . Depression   . Cough     due allergies chronic  . GERD (gastroesophageal reflux disease)   . Headache(784.0)   .  Arthritis   . Fibromyalgia   . Heart murmur     one time    Assessment: 67yof admitted for spinal surgery.  Hemovac drain was placed in epidural space.  She is allergic to PCN so vancomycin will be given while drain in place.  Afebrile, ABC wnl, Cr 0.7 CrCl about 50 ml/min.  Goal of Therapy:  Vancomycin trough level 10-15 mcg/ml  Plan:  Vancomycin 750mg  IV q24 hr starting 12hr after pre-op dose given while drain in place  .D. CPP, BCPS Clinical Pharmacist [phone] 04/23/2013 2:01 PM

## 2013-04-23 NOTE — Anesthesia Preprocedure Evaluation (Addendum)
Anesthesia Evaluation  Patient identified by MRN, date of birth, ID band Patient awake    Reviewed: Allergy & Precautions  History of Anesthesia Complications (+) PONV  Airway Mallampati: II TM Distance: >3 FB Neck ROM: Full    Dental  (+) Teeth Intact, Dental Advisory Given   Pulmonary  breath sounds clear to auscultation        Cardiovascular hypertension, Rhythm:Regular Rate:Normal     Neuro/Psych  Headaches, Anxiety  Neuromuscular disease    GI/Hepatic GERD-  ,  Endo/Other    Renal/GU      Musculoskeletal  (+) Fibromyalgia -  Abdominal   Peds  Hematology   Anesthesia Other Findings   Reproductive/Obstetrics                          Anesthesia Physical Anesthesia Plan  ASA: II  Anesthesia Plan: General   Post-op Pain Management:    Induction: Intravenous  Airway Management Planned: Oral ETT  Additional Equipment:   Intra-op Plan:   Post-operative Plan: Extubation in OR  Informed Consent: I have reviewed the patients History and Physical, chart, labs and discussed the procedure including the risks, benefits and alternatives for the proposed anesthesia with the patient or authorized representative who has indicated his/her understanding and acceptance.     Plan Discussed with:   Anesthesia Plan Comments:         Anesthesia Quick Evaluation

## 2013-04-23 NOTE — Anesthesia Procedure Notes (Signed)
Procedure Name: Intubation Date/Time: 04/23/2013 7:32 AM Performed by: Jefm Miles E Pre-anesthesia Checklist: Patient identified, Emergency Drugs available, Suction available, Patient being monitored and Timeout performed Patient Re-evaluated:Patient Re-evaluated prior to inductionOxygen Delivery Method: Circle system utilized Preoxygenation: Pre-oxygenation with 100% oxygen Intubation Type: IV induction Ventilation: Mask ventilation without difficulty Laryngoscope Size: Mac and 3 Grade View: Grade I Tube type: Oral Tube size: 7.0 mm Number of attempts: 1 Airway Equipment and Method: Stylet Placement Confirmation: ETT inserted through vocal cords under direct vision,  positive ETCO2 and breath sounds checked- equal and bilateral Secured at: 21 cm Tube secured with: Tape Dental Injury: Teeth and Oropharynx as per pre-operative assessment

## 2013-04-23 NOTE — Progress Notes (Signed)
Orthopedic Tech Progress Note Patient Details:  Carrie Austin 02-12-1945 [id number] Patient has own brace at home they will use. Patient ID: Carrie Austin, female   DOB: [birth year], 68 y.o.   MRN: [id number]   Carrie Austin 04/23/2013, 5:37 PM

## 2013-04-23 NOTE — Anesthesia Postprocedure Evaluation (Signed)
  Anesthesia Post-op Note  Patient: Carrie Austin  Procedure(s) Performed: Procedure(s): LUMBAR FIVE SACRAL ONE LAMINECTOMY POSTERIOR LUMBAR INTERBODY FUSION POSTERIOR LATERAL ARTHRODESIS POSTERIOR NONSEGMENTAL INSTRUMENTATION (N/A)  Patient Location: PACU  Anesthesia Type:General  Level of Consciousness: awake, oriented, sedated and patient cooperative  Airway and Oxygen Therapy: Patient Spontanous Breathing  Post-op Pain: mild  Post-op Assessment: Post-op Vital signs reviewed, Patient's Cardiovascular Status Stable, Respiratory Function Stable, Patent Airway, No signs of Nausea or vomiting and Pain level controlled  Post-op Vital Signs: stable  Last Vitals:  Filed Vitals:   04/23/13 1304  BP: 133/48  Pulse: 79  Temp:   Resp: 13    Complications: No apparent anesthesia complications

## 2013-04-23 NOTE — Op Note (Signed)
Brief history: The patient is a 68 year old white female on whom I performed at L3-4 and L4-5 decompression, instrumentation and fusion in December of 2014. The patient initially did well but has developed recurrent back, buttock, and leg pain. She has failed medical management. Serial x-rays have demonstrated progressive worsening of an L5-S1 spondylolisthesis. I have discussed the various treatment options with the patient including surgery. She has weighed the risks, benefits, and alternatives surgery and decided proceed with a L5-S1 decompression, instrumentation and fusion.  Preoperative diagnosis: L5 S1 spondylolisthesis, Degenerative disc disease, spinal stenosis compressing both the L5 and the S1 nerve roots; lumbago; lumbar radiculopathy; Postoperative diagnosis: The same  Procedure: L5 laminectomy/foraminotomies to decompress the bilateral L5 and S1 nerve roots(the work required to do this was in addition to the work required to do the posterior lumbar interbody fusion because of the patient's spinal stenosis, facet arthropathy. Etc. requiring a wide decompression of the nerve roots.); L5-S1 posterior lumbar interbody fusion with local morselized autograft bone and Actifusebone graft extender; insertion of interbody prosthesis at L5-S1 (globus peek interbody prosthesis); posterior segmental instrumentation from L3 to S1 with globus titanium pedicle screws and rods; posterior lateral arthrodesis at 5 S1 with local morselized autograft bone and Vitoss bone graft extender, exploration of lumbar fusion  Surgeon: Dr. Delma Officer  Asst.: Dr. Barnett Abu  Anesthesia: Gen. endotracheal  Estimated blood loss: 250 cc  Drains: One medium Hemovac  Complications: None  Description of procedure: The patient was brought to the operating room by the anesthesia team. General endotracheal anesthesia was induced. The patient was turned to the prone position on the Wilson frame. The patient's lumbosacral  region was then prepared with Betadine scrub and Betadine solution. Sterile drapes were applied.  I then injected the area to be incised with Marcaine with epinephrine solution. I then used the scalpel to make a linear midline incision through the patient's old surgical scar. I then used electrocautery to perform a bilateral subperiosteal dissection exposing the spinous process and lamina of L3 to the upper sacrum.We then inserted the Verstrac retractor to provide exposure.  I began the decompression by using the high speed drill to perform laminotomies at L5 bilaterally. We then used the Kerrison punches to complete the L5 laminectomy and removed the ligamentum flavum at L5-S1 and removed some of scar tissue at L4-5. We used the Kerrison punches to remove the medial facets at L5-S1. We performed wide foraminotomies about the bilateral L5 and S1 nerve roots completing the decompression.  We now turned our attention to the posterior lumbar interbody fusion. I used a scalpel to incise the intervertebral disc at L5-S1. I then performed a partial intervertebral discectomy at L5-S1 using the pituitary forceps. We prepared the vertebral endplates at L5-S1 for the fusion by removing the soft tissues with the curettes. We then used the trial spacers to pick the appropriate sized interbody prosthesis. We prefilled his prosthesis with a combination of local morselized autograft bone that we obtained during the decompression as well as Actifuse bone graft extender. We inserted the prefilled prosthesis into the interspace at L5-S1. There was a good snug fit of the prosthesis in the interspace. We then filled and the remainder of the intervertebral disc space with local morselized autograft bone and Actifuse. This completed the posterior lumbar interbody arthrodesis.  We now turned attention to the instrumentation. Under fluoroscopic guidance we cannulated the bilateral S1 pedicles with the bone probe. We then removed  the bone probe. We then tapped the  pedicle with a 6.5 millimeter tap. We then removed the tap. We probed inside the tapped pedicle with a ball probe to rule out cortical breaches. We then inserted a 7.5 x 40 millimeter pedicle screw into the S1 pedicles bilaterally under fluoroscopic guidance. We then palpated along the medial aspect of the pedicles to rule out cortical breaches. There were none. The nerve roots were not injured. We removed the old pedicle screws at L5 inserted 7.5 x 45 mm screws in order to be able to place a rod from L3-S1. We then connected the unilateral pedicle screws with a lordotic rod. We compressed the construct and secured the rod in place with the caps. We then tightened the caps appropriately. This completed the instrumentation from L3-S1.  We now turned our attention to the posterior lateral arthrodesis at L5-S1. We used the high-speed drill to decorticate the remainder of the facets, pars, transverse process at L5-S1. We then applied a combination of local morselized autograft bone and Vitoss bone graft extender over these decorticated posterior lateral structures. This completed the posterior lateral arthrodesis.  We then obtained hemostasis using bipolar electrocautery. We irrigated the wound out with bacitracin solution. We inspected the thecal sac and nerve roots and noted they were well decompressed. We then removed the retractor. We placed a medium Hemovac drain in the epidural space and tunneled out through separate stab wound. We reapproximated patient's thoracolumbar fascia with interrupted #1 Vicryl suture. We reapproximated patient's subcutaneous tissue with interrupted 2-0 Vicryl suture. The reapproximated patient's skin with Steri-Strips and benzoin. The wound was then coated with bacitracin ointment. A sterile dressing was applied. The drapes were removed. The patient was subsequently returned to the supine position where they were extubated by the anesthesia team. He  was then transported to the post anesthesia care unit in stable condition. All sponge instrument and needle counts were reportedly correct at the end of this case.

## 2013-04-24 LAB — BASIC METABOLIC PANEL
BUN: 7 mg/dL (ref 6–23)
CO2: 29 mEq/L (ref 19–32)
Calcium: 8.2 mg/dL — ABNORMAL LOW (ref 8.4–10.5)
Chloride: 104 mEq/L (ref 96–112)
Creatinine, Ser: 0.57 mg/dL (ref 0.50–1.10)
GFR calc Af Amer: >90 mL/min (ref >90)
GFR calc non Af Amer: >90 mL/min (ref >90)
Glucose, Bld: 104 mg/dL — ABNORMAL HIGH (ref 70–99)
Potassium: 3.6 mEq/L — ABNORMAL LOW (ref 3.7–5.3)
Sodium: 142 mEq/L (ref 137–147)

## 2013-04-24 LAB — CBC
HCT: 28.6 % — ABNORMAL LOW (ref 36.0–46.0)
Hemoglobin: 9.6 g/dL — ABNORMAL LOW (ref 12.0–15.0)
MCH: 27.7 pg (ref 26.0–34.0)
MCHC: 33.6 g/dL (ref 30.0–36.0)
MCV: 82.4 fL (ref 78.0–100.0)
Platelets: 136 10*3/uL — ABNORMAL LOW (ref 150–400)
RBC: 3.47 MIL/uL — ABNORMAL LOW (ref 3.87–5.11)
RDW: 15.1 % (ref 11.5–15.5)
WBC: 6 10*3/uL (ref 4.0–10.5)

## 2013-04-24 MED ORDER — POTASSIUM CHLORIDE CRYS ER 20 MEQ PO TBCR
40.0000 meq | EXTENDED_RELEASE_TABLET | Freq: Two times a day (BID) | ORAL | Status: DC
  Administered 2013-04-24 – 2013-04-26 (×5): 40 meq via ORAL
  Filled 2013-04-24 (×7): qty 2

## 2013-04-24 NOTE — Evaluation (Signed)
Physical Therapy Evaluation Patient Details Name: Carrie Austin MRN: [id number] DOB: 03-16-1945 Today's Date: 04/24/2013   History of Present Illness  Admitted with L5/S1 spondylosis and stenosis, s/p L5/S1 Plif  Clinical Impression  Pt admitted with/for lumbar fusion.  Pt currently limited functionally due to the problems listed below.  (see problems list.)  Pt will benefit from PT to maximize function and safety to be able to get home safely with available assist of family.     Follow Up Recommendations No PT follow up    Equipment Recommendations  None recommended by PT    Recommendations for Other Services       Precautions / Restrictions Precautions Precautions: Back Required Braces or Orthoses: Spinal Brace Spinal Brace: Lumbar corset;Applied in sitting position      Mobility  Bed Mobility Overal bed mobility: Needs Assistance Bed Mobility: Sidelying to Sit   Sidelying to sit: Min guard       General bed mobility comments: used rail, reinforced technique  Transfers Overall transfer level: Needs assistance   Transfers: Sit to/from Stand Sit to Stand: Supervision         General transfer comment: cues for hand placement  Ambulation/Gait Ambulation/Gait assistance: Supervision Ambulation Distance (Feet): 600 Feet Assistive device: None;Rolling walker (2 wheeled) Gait Pattern/deviations: Step-through pattern   Gait velocity interpretation: at or above normal speed for age/gender General Gait Details: steady and fluid gait  Stairs            Wheelchair Mobility    Modified Rankin (Stroke Patients Only)       Balance Overall balance assessment: No apparent balance deficits (not formally assessed)                                           Pertinent Vitals/Pain     Home Living Family/patient expects to be discharged to:: Private residence Living Arrangements: Spouse/significant other Available Help at Discharge:  Available 24 hours/day Type of Home: House Home Access: Stairs to enter Entrance Stairs-Rails: None Entrance Stairs-Number of Steps: 2 Home Layout: One level Home Equipment: None      Prior Function Level of Independence: Independent               Hand Dominance   Dominant Hand: Right    Extremity/Trunk Assessment   Upper Extremity Assessment: Overall WFL for tasks assessed           Lower Extremity Assessment: Overall WFL for tasks assessed (R leg/foot numbness/tingling)         Communication   Communication: No difficulties  Cognition Arousal/Alertness: Awake/alert Behavior During Therapy: WFL for tasks assessed/performed Overall Cognitive Status: Within Functional Limits for tasks assessed                      General Comments      Exercises        Assessment/Plan    PT Assessment Patient needs continued PT services  PT Diagnosis Acute pain   PT Problem List Decreased activity tolerance;Decreased mobility;Decreased knowledge of precautions;Pain  PT Treatment Interventions DME instruction;Gait training;Functional mobility training;Therapeutic activities;Stair training;Patient/family education   PT Goals (Current goals can be found in the Care Plan section) Acute Rehab PT Goals Patient Stated Goal: Independence PT Goal Formulation: With patient Time For Goal Achievement: 05/01/13 Potential to Achieve Goals: Good    Frequency Min 5X/week  Barriers to discharge        Co-evaluation               End of Session Equipment Utilized During Treatment: Back brace Activity Tolerance: Patient tolerated treatment well Patient left: in chair;with call bell/phone within reach;with family/visitor present Nurse Communication: Mobility status         Time: 0786-7544 PT Time Calculation (min): 22 min   Charges:   PT Evaluation $Initial PT Evaluation Tier I: 1 Procedure PT Treatments $Gait Training: 8-22 mins   PT G CodesEliseo Gum Sahid Borba 04/24/2013, 11:46 AM 04/24/2013  Kenton Bing, PT [phone]  (pager)

## 2013-04-24 NOTE — Progress Notes (Signed)
Patient ID: Carrie Austin, female   DOB: [birth year], 69 y.o.   MRN: [id number] Subjective:  the patient is alert and pleasant. She looks well. Her back is appropriately sore.  Objective: Vital signs in last 24 hours: Temp:  [97.5 F (36.4 C)-98.7 F (37.1 C)] 98.4 F (36.9 C) (04/14 0537) Pulse Rate:  [76-95] 86 (04/14 0537) Resp:  [10-22] 16 (04/14 0537) BP: (112-142)/(41-62) 112/43 mmHg (04/14 0537) SpO2:  [96 %-100 %] 96 % (04/14 0537) Weight:  [43.999 kg (97 lb)] 43.999 kg (97 lb) (04/13 1345)  Intake/Output from previous day: 04/13 0701 - 04/14 0700 In: 2000 [I.V.:2000] Out: 3505 [Urine:3175; Drains:230; Blood:100] Intake/Output this shift:    Physical exam the patient is alert and oriented. Her strength is normal. Her dressing is clean and dry.  Lab Results:  Recent Labs  04/24/13 0527  WBC 6.0  HGB 9.6*  HCT 28.6*  PLT 136*   BMET  Recent Labs  04/24/13 0527  NA 142  K 3.6*  CL 104  CO2 29  GLUCOSE 104*  BUN 7  CREATININE 0.57  CALCIUM 8.2*    Studies/Results: Dg Lumbar Spine 2-3 Views  04/23/2013   CLINICAL DATA:  Spondylolisthesis at L5-S1.  EXAM: LUMBAR SPINE - 2-3 VIEW; DG C-ARM 1-60 MIN  COMPARISON:  Radiographs dated 03/13/2013  FINDINGS: AP and lateral C-arm images demonstrate the patient has undergone interbody and posterior fusion at L5-S1. Spondylolisthesis is noted. Previous lumbar fusion at L3-4 and L4-5.  IMPRESSION: Fusion performed at L5-S1.   Electronically Signed   By: Geanie Cooley M.D.   On: 04/23/2013 13:39   Dg C-arm 1-60 Min  04/23/2013   CLINICAL DATA:  Spondylolisthesis at L5-S1.  EXAM: LUMBAR SPINE - 2-3 VIEW; DG C-ARM 1-60 MIN  COMPARISON:  Radiographs dated 03/13/2013  FINDINGS: AP and lateral C-arm images demonstrate the patient has undergone interbody and posterior fusion at L5-S1. Spondylolisthesis is noted. Previous lumbar fusion at L3-4 and L4-5.  IMPRESSION: Fusion performed at L5-S1.   Electronically Signed   By: Geanie Cooley M.D.   On: 04/23/2013 13:39    Assessment/Plan: Postop day #1: The patient is doing well. We will mobilize her with PT. We will discontinue her Hemovac drain.  Hypokalemia: Will give oral potassium.   LOS: 1 day     [name] 04/24/2013, 7:41 AM

## 2013-04-25 NOTE — Progress Notes (Signed)
Physical Therapy Treatment Patient Details Name: Carrie Austin MRN: [id number] DOB: [birth year] Today's Date: 04/25/2013    History of Present Illness Admitted with L5/S1 spondylosis and stenosis, s/p L5/S1 Plif    PT Comments    Progressing well.  All education including bracing issues, lifting restrictions, progression of activity completed, questions answered. Pt ready for d/c soon.   Follow Up Recommendations  No PT follow up     Equipment Recommendations  None recommended by PT    Recommendations for Other Services       Precautions / Restrictions Precautions Precautions: Back Required Braces or Orthoses: Spinal Brace Spinal Brace: Lumbar corset;Applied in sitting position    Mobility  Bed Mobility               General bed mobility comments: reinforced technique  Transfers Overall transfer level: Needs assistance Equipment used: Rolling walker (2 wheeled) Transfers: Sit to/from Stand Sit to Stand: Supervision         General transfer comment: cues for hand placement  Ambulation/Gait Ambulation/Gait assistance: Supervision Ambulation Distance (Feet): 600 Feet Assistive device: Rolling walker (2 wheeled) Gait Pattern/deviations: Step-through pattern     General Gait Details: steady and fluid gait: wanders a bit   Stairs Stairs: Yes Stairs assistance: Supervision Stair Management: One rail Left;Alternating pattern;Forwards (up with RW assist down with HHA or rail) Number of Stairs: 5 General stair comments: fluid no matter the technique  Wheelchair Mobility    Modified Rankin (Stroke Patients Only)       Balance Overall balance assessment: No apparent balance deficits (not formally assessed)                                  Cognition Arousal/Alertness: Awake/alert Behavior During Therapy: WFL for tasks assessed/performed Overall Cognitive Status: Within Functional Limits for tasks assessed                       Exercises      General Comments        Pertinent Vitals/Pain 6/10 pain at incision site.    Home Living                      Prior Function            PT Goals (current goals can now be found in the care plan section) Acute Rehab PT Goals Patient Stated Goal: Independence PT Goal Formulation: With patient Time For Goal Achievement: 05/01/13 Potential to Achieve Goals: Good Progress towards PT goals: Progressing toward goals    Frequency  Min 5X/week    PT Plan Current plan remains appropriate    Co-evaluation             End of Session Equipment Utilized During Treatment: Back brace Activity Tolerance: Patient tolerated treatment well Patient left: in chair;with call bell/phone within reach     Time: 4650-3546 PT Time Calculation (min): 20 min  Charges:  $Gait Training: 8-22 mins                    G CodesEliseo Gum Kyera Felan 04/25/2013, 11:26 AM 04/25/2013  Leonard Bing, PT [phone]  (pager)

## 2013-04-25 NOTE — Progress Notes (Signed)
Patient ID: Carrie Austin, female   DOB: [birth year], 68 y.o.   MRN: [id number] Subjective:  the patient is alert and pleasant. She looks well. She is appropriate sore.  Objective: Vital signs in last 24 hours: Temp:  [98.2 F (36.8 C)-100.1 F (37.8 C)] 98.9 F (37.2 C) (04/15 0507) Pulse Rate:  [72-98] 88 (04/15 0507) Resp:  [16-46] 16 (04/15 0507) BP: (99-130)/(41-54) 123/54 mmHg (04/15 0507) SpO2:  [96 %-98 %] 97 % (04/15 0507)  Intake/Output from previous day: 04/14 0701 - 04/15 0700 In: 700 [P.O.:700] Out: -  Intake/Output this shift:    Physical exam the patient is alert and oriented. Her strength is normal. Her dressing is clean dry.  Lab Results:  Recent Labs  04/24/13 0527  WBC 6.0  HGB 9.6*  HCT 28.6*  PLT 136*   BMET  Recent Labs  04/24/13 0527  NA 142  K 3.6*  CL 104  CO2 29  GLUCOSE 104*  BUN 7  CREATININE 0.57  CALCIUM 8.2*    Studies/Results: Dg Lumbar Spine 2-3 Views  04/23/2013   CLINICAL DATA:  Spondylolisthesis at L5-S1.  EXAM: LUMBAR SPINE - 2-3 VIEW; DG C-ARM 1-60 MIN  COMPARISON:  Radiographs dated 03/13/2013  FINDINGS: AP and lateral C-arm images demonstrate the patient has undergone interbody and posterior fusion at L5-S1. Spondylolisthesis is noted. Previous lumbar fusion at L3-4 and L4-5.  IMPRESSION: Fusion performed at L5-S1.   Electronically Signed   By: Geanie Cooley M.D.   On: 04/23/2013 13:39   Dg C-arm 1-60 Min  04/23/2013   CLINICAL DATA:  Spondylolisthesis at L5-S1.  EXAM: LUMBAR SPINE - 2-3 VIEW; DG C-ARM 1-60 MIN  COMPARISON:  Radiographs dated 03/13/2013  FINDINGS: AP and lateral C-arm images demonstrate the patient has undergone interbody and posterior fusion at L5-S1. Spondylolisthesis is noted. Previous lumbar fusion at L3-4 and L4-5.  IMPRESSION: Fusion performed at L5-S1.   Electronically Signed   By: Geanie Cooley M.D.   On: 04/23/2013 13:39    Assessment/Plan: Postop day #2: The patient is doing well. We will  continue to mobilize her. She may be able to go home tomorrow.   LOS: 2 days     [name] 04/25/2013, 8:42 AM

## 2013-04-25 NOTE — Progress Notes (Signed)
UR complete.  Aleicia Kenagy RN, MSN 

## 2013-04-26 MED ORDER — OXYCODONE-ACETAMINOPHEN 10-325 MG PO TABS: 1.0000 | ORAL_TABLET | ORAL | Status: DC | PRN

## 2013-04-26 MED ORDER — DSS 100 MG PO CAPS: 100.0000 mg | ORAL_CAPSULE | Freq: Two times a day (BID) | ORAL | Status: DC

## 2013-04-26 MED ORDER — DIAZEPAM 5 MG PO TABS: 5.0000 mg | ORAL_TABLET | Freq: Four times a day (QID) | ORAL | Status: DC | PRN

## 2013-04-26 NOTE — Progress Notes (Signed)
Pt. Was DC'd home via car with family.  DC instructions given to patient along with prescriptions.  Vital signs and assessments were stable.

## 2013-04-26 NOTE — Discharge Summary (Signed)
Physician Discharge Summary  Patient ID: Carrie Austin MRN: [id number] DOB/AGE: [birth year] 68 y.o.  Admit date: 04/23/2013 Discharge date: 04/26/2013  Admission Diagnoses: L5-S1 spondylolisthesis, spinal stenosis, lumbago, lumbar radiculopathy, neurogenic claudication  Discharge Diagnoses: The same Active Problems:   Spondylolisthesis of lumbar region   Discharged Condition: good  Hospital Course: I performed an exploration of the patient's lumbar fusion with at L5-S1 decompression, instrumentation, and fusion on 04/23/2013. The surgery went well.  The patient's postoperative course was unremarkable. On postop day #3 she requested discharge to home. The patient was given oral and written discharge instructions. All her questions were answered.  Consults: PT, OT Significant Diagnostic Studies: None Treatments: L5-S1 decompression, instrumentation, and fusion. Discharge Exam: Blood pressure 120/48, pulse 77, temperature 98.1 F (36.7 C), temperature source Oral, resp. rate 16, height 5' (1.524 m), weight 43.999 kg (97 lb), SpO2 99.00%. The patient is alert and pleasant. Her dressing is clean and dry. Her strength is normal in her lower extremities.  Disposition: Home  Discharge Orders   Future Orders Complete By Expires   Call MD for:  difficulty breathing, headache or visual disturbances  As directed    Call MD for:  extreme fatigue  As directed    Call MD for:  hives  As directed    Call MD for:  persistant dizziness or light-headedness  As directed    Call MD for:  persistant nausea and vomiting  As directed    Call MD for:  redness, tenderness, or signs of infection (pain, swelling, redness, odor or green/yellow discharge around incision site)  As directed    Call MD for:  severe uncontrolled pain  As directed    Call MD for:  temperature >100.4  As directed    Diet - low sodium heart healthy  As directed    Discharge instructions  As directed    Driving Restrictions   As directed    Increase activity slowly  As directed    Lifting restrictions  As directed    May shower / Bathe  As directed    No dressing needed  As directed        Medication List    STOP taking these medications       ALPRAZolam 0.5 MG tablet  Commonly known as:  XANAX     CALCIUM 600 + D PO      TAKE these medications       amitriptyline 25 MG tablet  Commonly known as:  ELAVIL  Take 75 mg by mouth at bedtime.     atorvastatin 20 MG tablet  Commonly known as:  LIPITOR  Take 20 mg by mouth once a week.     clobetasol ointment 0.05 %  Commonly known as:  TEMOVATE  Apply 1 application topically 2 (two) times daily as needed (for rash).     COD LIVER OIL PO  Take 5 mLs by mouth daily.     diazepam 5 MG tablet  Commonly known as:  VALIUM  Take 1 tablet (5 mg total) by mouth every 6 (six) hours as needed for muscle spasms.     DSS 100 MG Caps  Take 100 mg by mouth 2 (two) times daily.     DSS 100 MG Caps  Take 100 mg by mouth 2 (two) times daily.     escitalopram 20 MG tablet  Commonly known as:  LEXAPRO  Take 20 mg by mouth daily.     gabapentin 300 MG capsule  Commonly known as:  NEURONTIN  Take 300 mg by mouth 3 (three) times daily.     hydrocortisone 2.5 % cream  Apply 1 application topically 2 (two) times daily.     losartan 100 MG tablet  Commonly known as:  COZAAR  Take 100 mg by mouth daily.     magnesium oxide 400 MG tablet  Commonly known as:  MAG-OX  Take 400 mg by mouth daily.     multivitamin with minerals tablet  Take 1 tablet by mouth daily.     oxyCODONE-acetaminophen 10-325 MG per tablet  Commonly known as:  PERCOCET  Take 1 tablet by mouth every 4 (four) hours as needed for pain.     oxyCODONE-acetaminophen 10-325 MG per tablet  Commonly known as:  PERCOCET  Take 1 tablet by mouth every 4 (four) hours as needed for pain.     promethazine 12.5 MG tablet  Commonly known as:  PHENERGAN  Take 1 tablet (12.5 mg total) by mouth  every 6 (six) hours as needed for nausea or vomiting.     Vitamin D 2000 UNITS Caps  Take 2,000 Units by mouth daily.         Signed: [name] 04/26/2013, 9:35 AM

## 2013-12-26 DIAGNOSIS — J3089 Other allergic rhinitis: Secondary | ICD-10-CM | POA: Insufficient documentation

## 2015-02-26 DIAGNOSIS — K581 Irritable bowel syndrome with constipation: Secondary | ICD-10-CM | POA: Insufficient documentation

## 2015-03-17 IMAGING — CT CT CHEST W/ CM
2 of 4 series · 15 of 36 positions shown, 18 images · IV contrast (APPLIED)
Comparison: Chest radiograph on 12/14/2012

CLINICAL DATA: Left lung nodule seen on chest radiograph.

EXAM:
CT CHEST WITH CONTRAST
TECHNIQUE: Multidetector CT imaging of the chest was performed during
intravenous contrast administration.
CONTRAST:  100mL OMNIPAQUE IOHEXOL 300 MG/ML  SOLN

[Series 2: thorax 5.0 i31f 1 · axial · 0.61mm/px · z∈[+1325,+1615]mm · 12 of 68 slices shown, 15 images]
[im 5/68  mediastinal]
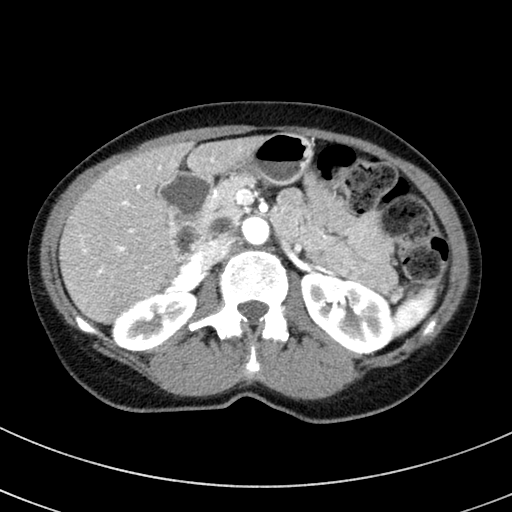
[im 5/68  lung]
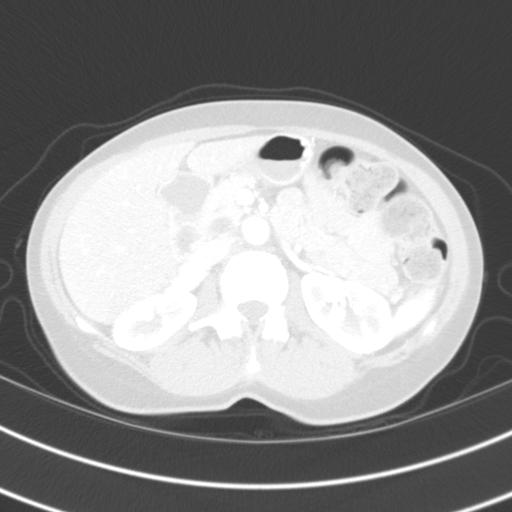
[im 10/68  lung]
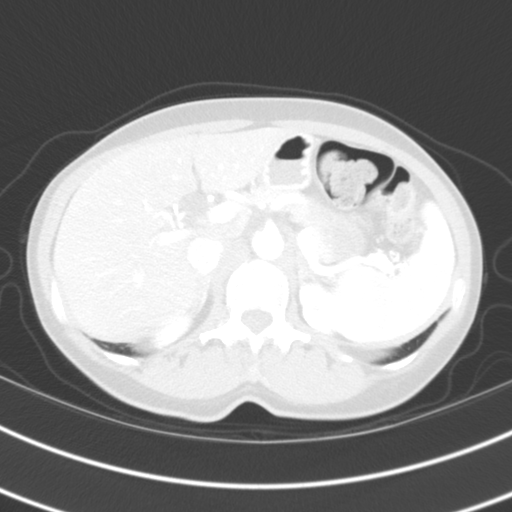
[im 15/68  lung]
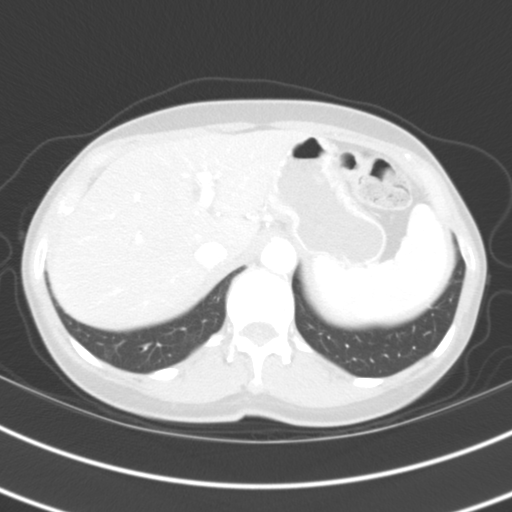
[im 20/68  lung]
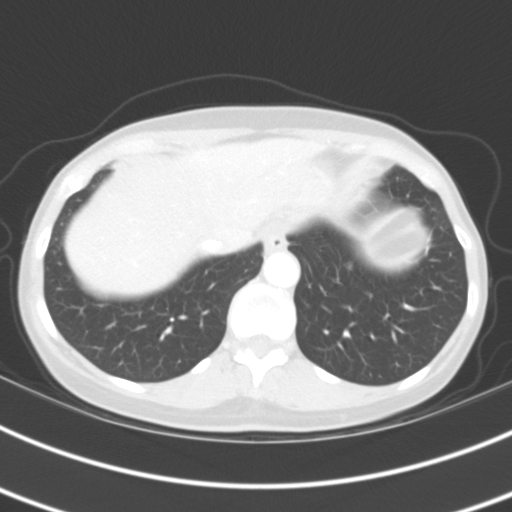
[im 24/68  mediastinal]
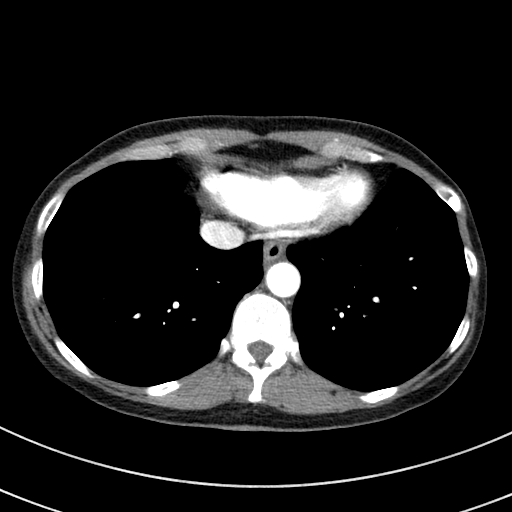
[im 24/68  lung]
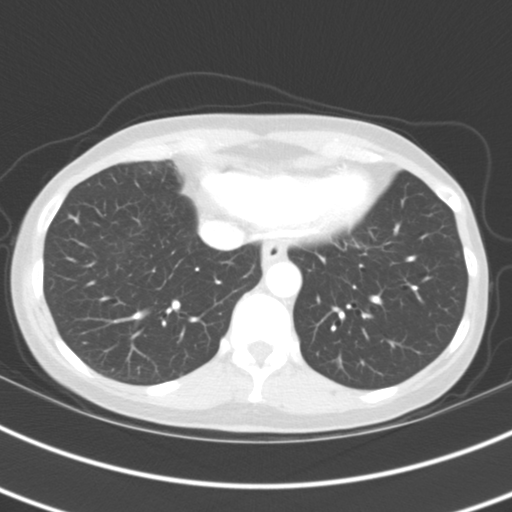
[im 29/68  lung]
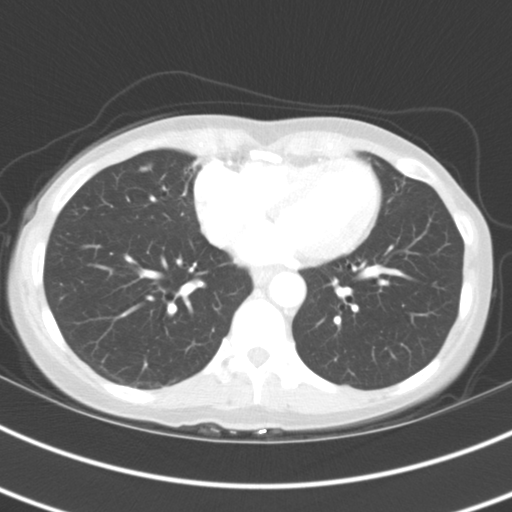
[im 39/68  lung]
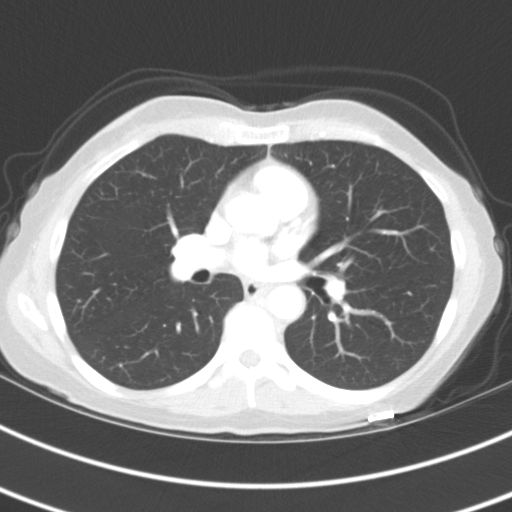
[im 44/68  lung]
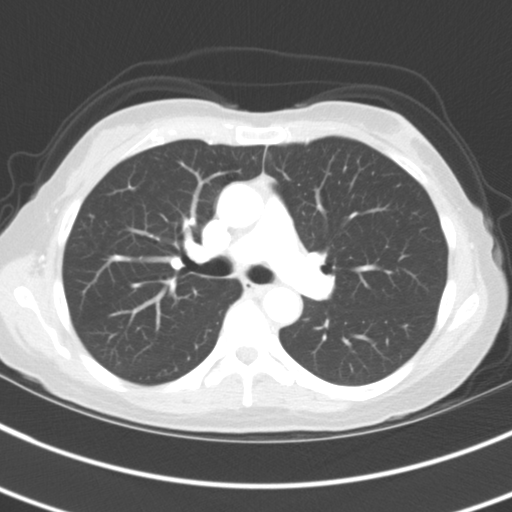
[im 48/68  mediastinal]
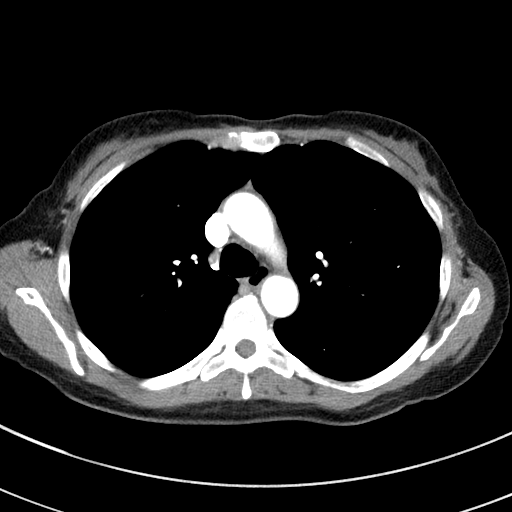
[im 48/68  lung]
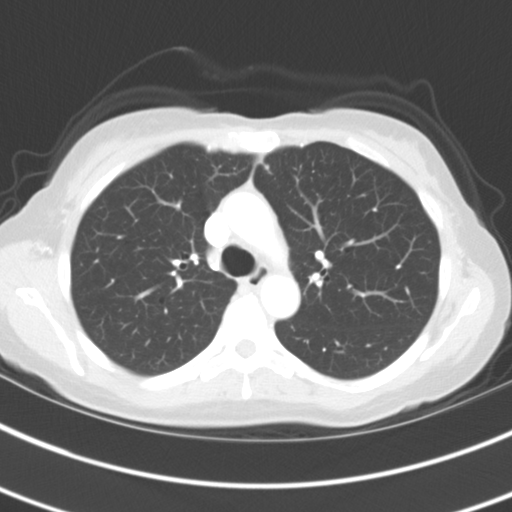
[im 53/68  lung]
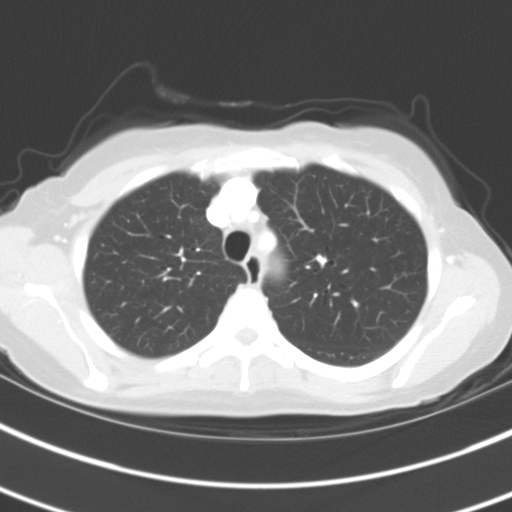
[im 58/68  lung]
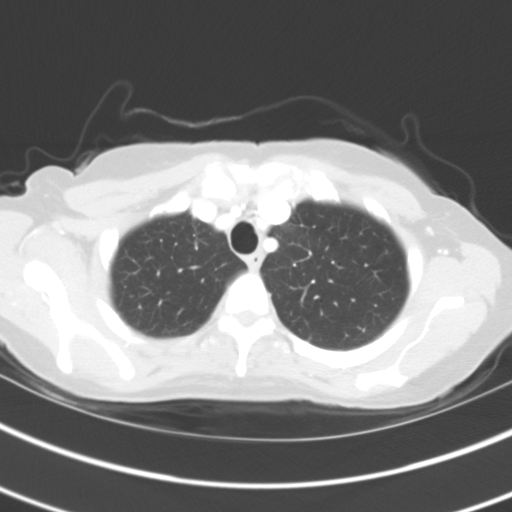
[im 63/68  lung]
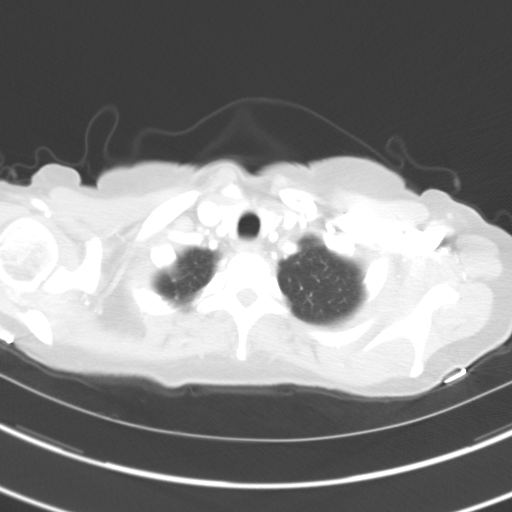

[Series 5: coronal · coronal · 0.63mm/px · 3 of 101 slices shown]
[im 21/101  lung]
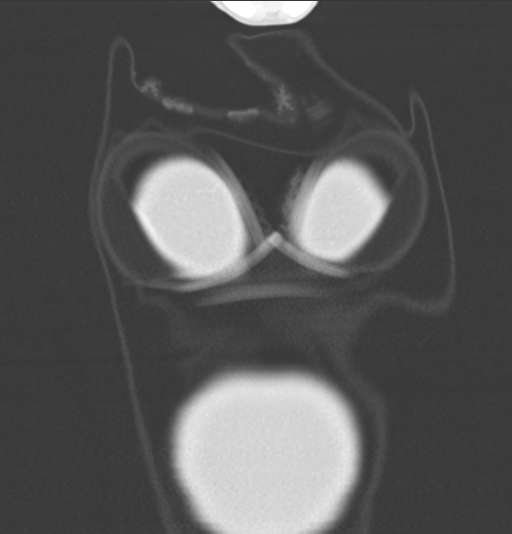
[im 41/101  lung]
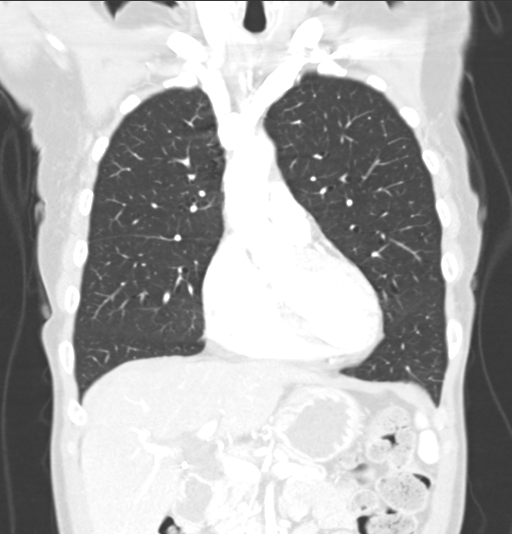
[im 61/101  lung]
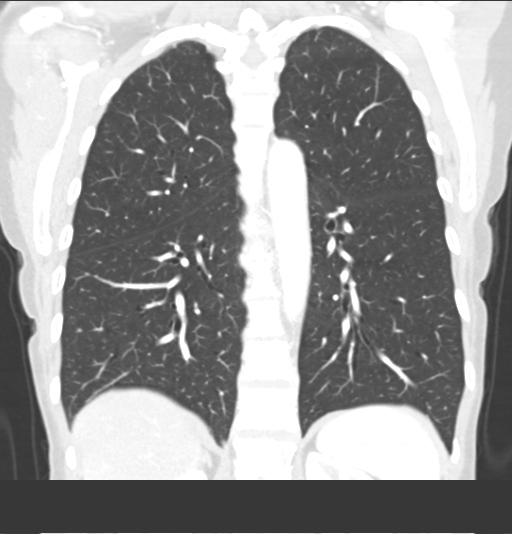

[15 of 36 positions shown; findings below may reference images not displayed]

FINDINGS: No suspicious pulmonary nodules or masses are seen in the lateral
left lung base, or elsewhere within either lung field. No evidence
pulmonary infiltrate or central endobronchial lesion. Mild scarring
noted in the inferior aspect of the right middle lobe and lingula.

No evidence of hilar or mediastinal lymphadenopathy. No adenopathy
elsewhere within the thorax. No evidence of chest wall mass or
suspicious bone lesions. Both adrenal glands are also normal in
appearance.
IMPRESSION: Negative. No evidence of pulmonary neoplasm or other active disease.

## 2015-04-14 DIAGNOSIS — K5900 Constipation, unspecified: Secondary | ICD-10-CM | POA: Insufficient documentation

## 2015-05-12 DIAGNOSIS — G894 Chronic pain syndrome: Secondary | ICD-10-CM | POA: Insufficient documentation

## 2015-05-12 DIAGNOSIS — F3340 Major depressive disorder, recurrent, in remission, unspecified: Secondary | ICD-10-CM | POA: Insufficient documentation

## 2015-05-12 DIAGNOSIS — F332 Major depressive disorder, recurrent severe without psychotic features: Secondary | ICD-10-CM | POA: Insufficient documentation

## 2015-05-12 DIAGNOSIS — M810 Age-related osteoporosis without current pathological fracture: Secondary | ICD-10-CM | POA: Insufficient documentation

## 2015-05-12 DIAGNOSIS — I10 Essential (primary) hypertension: Secondary | ICD-10-CM | POA: Insufficient documentation

## 2015-05-12 DIAGNOSIS — N3941 Urge incontinence: Secondary | ICD-10-CM | POA: Insufficient documentation

## 2015-05-12 DIAGNOSIS — E782 Mixed hyperlipidemia: Secondary | ICD-10-CM | POA: Insufficient documentation

## 2015-05-22 DIAGNOSIS — I83813 Varicose veins of bilateral lower extremities with pain: Secondary | ICD-10-CM | POA: Insufficient documentation

## 2015-07-01 DIAGNOSIS — K2900 Acute gastritis without bleeding: Secondary | ICD-10-CM | POA: Insufficient documentation

## 2015-12-01 DIAGNOSIS — M7541 Impingement syndrome of right shoulder: Secondary | ICD-10-CM | POA: Insufficient documentation

## 2016-04-07 ENCOUNTER — Other Ambulatory Visit: Payer: Self-pay | Admitting: Neurosurgery

## 2016-05-07 NOTE — Pre-Procedure Instructions (Signed)
    Carrie Austin  05/07/2016      [facility] - [place], [place] - [address] [place] [address] Phone: [phone] Fax: [phone]    Your procedure is scheduled on 05/17/16.  Report to [facility] Admitting at 1030 A.M.  Call this number if you have problems the morning of surgery:  [phone]   Remember:  Do not eat food or drink liquids after midnight.  Take these medicines the morning of surgery with A SIP OF WATER ---tylenol,xanax,lexapro,neurontin,hydrocodone,prilosec   Do not wear jewelry, make-up or nail polish.  Do not wear lotions, powders, or perfumes, or deoderant.  Do not shave 48 hours prior to surgery.  Men may shave face and neck.  Do not bring valuables to the hospital.  [facility] is not responsible for any belongings or valuables.  Contacts, dentures or bridgework may not be worn into surgery.  Leave your suitcase in the car.  After surgery it may be brought to your room.  For patients admitted to the hospital, discharge time will be determined by your treatment team.  Patients discharged the day of surgery will not be allowed to drive home.   Name and phone number of your driver:  Special instructions:  Do not take any aspirin,anti-inflammatories,vitamins,or herbal supplements 5-7 days prior to surgery.  Please read over the following fact sheets that you were given. MRSA Information

## 2016-05-10 ENCOUNTER — Encounter (HOSPITAL_COMMUNITY)
Admission: RE | Admit: 2016-05-10 | Discharge: 2016-05-10 | Disposition: A | Discharge status: Home / Self Care | Payer: Medicare HMO | Source: Ambulatory Visit | Attending: Neurosurgery | Admitting: Neurosurgery

## 2016-05-10 ENCOUNTER — Encounter (HOSPITAL_COMMUNITY): Payer: Self-pay

## 2016-05-10 DIAGNOSIS — Z01812 Encounter for preprocedural laboratory examination: Secondary | ICD-10-CM | POA: Diagnosis not present

## 2016-05-10 DIAGNOSIS — Z01818 Encounter for other preprocedural examination: Secondary | ICD-10-CM | POA: Diagnosis present

## 2016-05-10 DIAGNOSIS — I510 Cardiac septal defect, acquired: Secondary | ICD-10-CM | POA: Insufficient documentation

## 2016-05-10 DIAGNOSIS — M48061 Spinal stenosis, lumbar region without neurogenic claudication: Secondary | ICD-10-CM | POA: Diagnosis not present

## 2016-05-10 DIAGNOSIS — I10 Essential (primary) hypertension: Secondary | ICD-10-CM | POA: Diagnosis not present

## 2016-05-10 DIAGNOSIS — Z01810 Encounter for preprocedural cardiovascular examination: Secondary | ICD-10-CM | POA: Diagnosis not present

## 2016-05-10 DIAGNOSIS — R9431 Abnormal electrocardiogram [ECG] [EKG]: Secondary | ICD-10-CM | POA: Insufficient documentation

## 2016-05-10 HISTORY — DX: Other specified diseases of esophagus: K22.8

## 2016-05-10 HISTORY — DX: Other specified disease of esophagus: K22.89

## 2016-05-10 HISTORY — DX: Allergy, unspecified, initial encounter: T78.40XA

## 2016-05-10 LAB — CBC
HCT: 38.8 % (ref 36.0–46.0)
Hemoglobin: 13 g/dL (ref 12.0–15.0)
MCH: 29 pg (ref 26.0–34.0)
MCHC: 33.5 g/dL (ref 30.0–36.0)
MCV: 86.4 fL (ref 78.0–100.0)
Platelets: 173 10*3/uL (ref 150–400)
RBC: 4.49 MIL/uL (ref 3.87–5.11)
RDW: 13.3 % (ref 11.5–15.5)
WBC: 4.7 10*3/uL (ref 4.0–10.5)

## 2016-05-10 LAB — BASIC METABOLIC PANEL
Anion gap: 7 (ref 5–15)
BUN: 9 mg/dL (ref 6–20)
CO2: 28 mmol/L (ref 22–32)
Calcium: 9.1 mg/dL (ref 8.9–10.3)
Chloride: 102 mmol/L (ref 101–111)
Creatinine, Ser: 0.65 mg/dL (ref 0.44–1.00)
GFR calc Af Amer: >60 mL/min (ref >60)
GFR calc non Af Amer: >60 mL/min (ref >60)
Glucose, Bld: 70 mg/dL (ref 65–99)
Potassium: 3.6 mmol/L (ref 3.5–5.1)
Sodium: 137 mmol/L (ref 135–145)

## 2016-05-10 LAB — TYPE AND SCREEN
ABO/RH(D): AB POS
Antibody Screen: NEGATIVE

## 2016-05-10 LAB — SURGICAL PCR SCREEN
MRSA, PCR: NEGATIVE
Staphylococcus aureus: NEGATIVE

## 2016-05-10 MED ORDER — CHLORHEXIDINE GLUCONATE CLOTH 2 % EX PADS: 6.0000 | MEDICATED_PAD | Freq: Once | CUTANEOUS | Status: DC

## 2016-05-14 MED ORDER — VANCOMYCIN HCL IN DEXTROSE 1-5 GM/200ML-% IV SOLN
1000.0000 mg | INTRAVENOUS | Status: AC
  Administered 2016-05-17: 1000 mg via INTRAVENOUS
  Filled 2016-05-14: qty 200

## 2016-05-17 ENCOUNTER — Inpatient Hospital Stay (HOSPITAL_COMMUNITY): Payer: Medicare HMO | Admitting: Certified Registered Nurse Anesthetist

## 2016-05-17 ENCOUNTER — Inpatient Hospital Stay (HOSPITAL_COMMUNITY)
Admission: RE | Disposition: A | Discharge status: Home / Self Care | Payer: Self-pay | Source: Ambulatory Visit | Attending: Neurosurgery

## 2016-05-17 ENCOUNTER — Encounter (HOSPITAL_COMMUNITY): Payer: Self-pay | Admitting: *Deleted

## 2016-05-17 ENCOUNTER — Inpatient Hospital Stay (HOSPITAL_COMMUNITY)
Admission: RE | Admit: 2016-05-17 | Discharge: 2016-05-18 | DRG: 455 | Disposition: A | Discharge status: Home / Self Care | Payer: Medicare HMO | Source: Ambulatory Visit | Attending: Neurosurgery | Admitting: Neurosurgery

## 2016-05-17 ENCOUNTER — Inpatient Hospital Stay (HOSPITAL_COMMUNITY): Payer: Medicare HMO

## 2016-05-17 DIAGNOSIS — I10 Essential (primary) hypertension: Secondary | ICD-10-CM | POA: Diagnosis present

## 2016-05-17 DIAGNOSIS — Z888 Allergy status to other drugs, medicaments and biological substances status: Secondary | ICD-10-CM | POA: Diagnosis not present

## 2016-05-17 DIAGNOSIS — F329 Major depressive disorder, single episode, unspecified: Secondary | ICD-10-CM | POA: Diagnosis present

## 2016-05-17 DIAGNOSIS — M5116 Intervertebral disc disorders with radiculopathy, lumbar region: Principal | ICD-10-CM | POA: Diagnosis present

## 2016-05-17 DIAGNOSIS — Z79899 Other long term (current) drug therapy: Secondary | ICD-10-CM

## 2016-05-17 DIAGNOSIS — K219 Gastro-esophageal reflux disease without esophagitis: Secondary | ICD-10-CM | POA: Diagnosis present

## 2016-05-17 DIAGNOSIS — Z419 Encounter for procedure for purposes other than remedying health state, unspecified: Secondary | ICD-10-CM

## 2016-05-17 DIAGNOSIS — M545 Low back pain: Secondary | ICD-10-CM | POA: Diagnosis present

## 2016-05-17 DIAGNOSIS — M48062 Spinal stenosis, lumbar region with neurogenic claudication: Secondary | ICD-10-CM | POA: Diagnosis present

## 2016-05-17 SURGERY — POSTERIOR LUMBAR FUSION 1 LEVEL
Anesthesia: General | Site: Back

## 2016-05-17 MED ORDER — BACITRACIN ZINC 500 UNIT/GM EX OINT
TOPICAL_OINTMENT | CUTANEOUS | Status: AC
  Filled 2016-05-17: qty 28.35

## 2016-05-17 MED ORDER — SODIUM CHLORIDE 0.9% FLUSH: 3.0000 mL | Freq: Two times a day (BID) | INTRAVENOUS | Status: DC

## 2016-05-17 MED ORDER — VANCOMYCIN HCL 1000 MG IV SOLR
INTRAVENOUS | Status: DC | PRN
  Administered 2016-05-17: 1000 mg via TOPICAL

## 2016-05-17 MED ORDER — ONDANSETRON HCL 4 MG PO TABS: 4.0000 mg | ORAL_TABLET | Freq: Four times a day (QID) | ORAL | Status: DC | PRN

## 2016-05-17 MED ORDER — ONDANSETRON HCL 4 MG/2ML IJ SOLN
INTRAMUSCULAR | Status: DC | PRN
  Administered 2016-05-17: 4 mg via INTRAVENOUS

## 2016-05-17 MED ORDER — ONDANSETRON HCL 4 MG/2ML IJ SOLN: 4.0000 mg | Freq: Four times a day (QID) | INTRAMUSCULAR | Status: DC | PRN

## 2016-05-17 MED ORDER — FENTANYL CITRATE (PF) 100 MCG/2ML IJ SOLN
INTRAMUSCULAR | Status: DC | PRN
  Administered 2016-05-17: 50 ug via INTRAVENOUS
  Administered 2016-05-17: 100 ug via INTRAVENOUS

## 2016-05-17 MED ORDER — PHENOL 1.4 % MT LIQD: 1.0000 | OROMUCOSAL | Status: DC | PRN

## 2016-05-17 MED ORDER — HYDROMORPHONE HCL 1 MG/ML IJ SOLN
0.2500 mg | INTRAMUSCULAR | Status: DC | PRN
  Administered 2016-05-17 (×2): 0.5 mg via INTRAVENOUS

## 2016-05-17 MED ORDER — BISACODYL 10 MG RE SUPP: 10.0000 mg | Freq: Every day | RECTAL | Status: DC | PRN

## 2016-05-17 MED ORDER — PHENYLEPHRINE HCL 10 MG/ML IJ SOLN
INTRAVENOUS | Status: DC | PRN
  Administered 2016-05-17: 30 ug/min via INTRAVENOUS

## 2016-05-17 MED ORDER — SODIUM CHLORIDE 0.9 % IR SOLN
Status: DC | PRN
  Administered 2016-05-17: 500 mL

## 2016-05-17 MED ORDER — THROMBIN 20000 UNITS EX SOLR
CUTANEOUS | Status: AC
  Filled 2016-05-17: qty 20000

## 2016-05-17 MED ORDER — THROMBIN 20000 UNITS EX SOLR
CUTANEOUS | Status: DC | PRN
  Administered 2016-05-17: 20 mL via TOPICAL

## 2016-05-17 MED ORDER — BACITRACIN ZINC 500 UNIT/GM EX OINT
TOPICAL_OINTMENT | CUTANEOUS | Status: DC | PRN
  Administered 2016-05-17: 1 via TOPICAL

## 2016-05-17 MED ORDER — ZOLPIDEM TARTRATE 5 MG PO TABS: 5.0000 mg | ORAL_TABLET | Freq: Every evening | ORAL | Status: DC | PRN

## 2016-05-17 MED ORDER — MORPHINE SULFATE (PF) 4 MG/ML IV SOLN
4.0000 mg | INTRAVENOUS | Status: DC | PRN
  Administered 2016-05-17: 4 mg via INTRAVENOUS
  Filled 2016-05-17: qty 1

## 2016-05-17 MED ORDER — SUGAMMADEX SODIUM 200 MG/2ML IV SOLN
INTRAVENOUS | Status: DC | PRN
  Administered 2016-05-17: 89 mg via INTRAVENOUS

## 2016-05-17 MED ORDER — ARTIFICIAL TEARS OPHTHALMIC OINT
1.0000 "application " | TOPICAL_OINTMENT | Freq: Every day | OPHTHALMIC | Status: DC
  Administered 2016-05-17: 1 via OPHTHALMIC
  Filled 2016-05-17: qty 3.5

## 2016-05-17 MED ORDER — LOSARTAN POTASSIUM 50 MG PO TABS
100.0000 mg | ORAL_TABLET | Freq: Every day | ORAL | Status: DC
  Administered 2016-05-18: 100 mg via ORAL
  Filled 2016-05-17: qty 2

## 2016-05-17 MED ORDER — PROPYLENE GLYCOL 0.6 % OP SOLN: 1.0000 [drp] | Freq: Four times a day (QID) | OPHTHALMIC | Status: DC | PRN

## 2016-05-17 MED ORDER — LIDOCAINE HCL (CARDIAC) 20 MG/ML IV SOLN
INTRAVENOUS | Status: DC | PRN
  Administered 2016-05-17: 60 mg via INTRAVENOUS

## 2016-05-17 MED ORDER — CYCLOBENZAPRINE HCL 10 MG PO TABS: 10.0000 mg | ORAL_TABLET | Freq: Three times a day (TID) | ORAL | Status: DC | PRN

## 2016-05-17 MED ORDER — GABAPENTIN 100 MG PO CAPS
100.0000 mg | ORAL_CAPSULE | Freq: Two times a day (BID) | ORAL | Status: DC
  Administered 2016-05-17 – 2016-05-18 (×2): 100 mg via ORAL
  Filled 2016-05-17 (×2): qty 1

## 2016-05-17 MED ORDER — 0.9 % SODIUM CHLORIDE (POUR BTL) OPTIME
TOPICAL | Status: DC | PRN
  Administered 2016-05-17: 1000 mL

## 2016-05-17 MED ORDER — PANTOPRAZOLE SODIUM 40 MG PO TBEC
40.0000 mg | DELAYED_RELEASE_TABLET | Freq: Every day | ORAL | Status: DC
  Administered 2016-05-17: 40 mg via ORAL
  Filled 2016-05-17: qty 1

## 2016-05-17 MED ORDER — HYPROMELLOSE (GONIOSCOPIC) 2.5 % OP SOLN
1.0000 [drp] | Freq: Four times a day (QID) | OPHTHALMIC | Status: DC | PRN
  Filled 2016-05-17: qty 15

## 2016-05-17 MED ORDER — ATORVASTATIN CALCIUM 20 MG PO TABS: 20.0000 mg | ORAL_TABLET | ORAL | Status: DC

## 2016-05-17 MED ORDER — MAGNESIUM OXIDE 400 (241.3 MG) MG PO TABS
400.0000 mg | ORAL_TABLET | Freq: Every day | ORAL | Status: DC
  Administered 2016-05-18: 400 mg via ORAL
  Filled 2016-05-17: qty 1

## 2016-05-17 MED ORDER — GELATIN ABSORBABLE MT POWD
OROMUCOSAL | Status: DC | PRN
  Administered 2016-05-17: 5 mL via TOPICAL

## 2016-05-17 MED ORDER — SODIUM CHLORIDE 0.9% FLUSH: 3.0000 mL | INTRAVENOUS | Status: DC | PRN

## 2016-05-17 MED ORDER — DOCUSATE SODIUM 100 MG PO CAPS
100.0000 mg | ORAL_CAPSULE | Freq: Two times a day (BID) | ORAL | Status: DC
  Administered 2016-05-17 – 2016-05-18 (×2): 100 mg via ORAL
  Filled 2016-05-17 (×2): qty 1

## 2016-05-17 MED ORDER — VITAMIN D 1000 UNITS PO TABS
2000.0000 [IU] | ORAL_TABLET | Freq: Every day | ORAL | Status: DC
  Administered 2016-05-18: 2000 [IU] via ORAL
  Filled 2016-05-17: qty 2

## 2016-05-17 MED ORDER — MIDAZOLAM HCL 2 MG/2ML IJ SOLN
INTRAMUSCULAR | Status: AC
  Filled 2016-05-17: qty 2

## 2016-05-17 MED ORDER — BUPIVACAINE-EPINEPHRINE (PF) 0.5% -1:200000 IJ SOLN
INTRAMUSCULAR | Status: DC | PRN
  Administered 2016-05-17: 10 mL

## 2016-05-17 MED ORDER — MIDAZOLAM HCL 5 MG/5ML IJ SOLN
INTRAMUSCULAR | Status: DC | PRN
  Administered 2016-05-17: 2 mg via INTRAVENOUS

## 2016-05-17 MED ORDER — GLYCOPYRROLATE 0.2 MG/ML IJ SOLN
INTRAMUSCULAR | Status: DC | PRN
  Administered 2016-05-17: 0.1 mg via INTRAVENOUS
  Administered 2016-05-17 (×2): 0.2 mg via INTRAVENOUS

## 2016-05-17 MED ORDER — THROMBIN 5000 UNITS EX SOLR
CUTANEOUS | Status: AC
  Filled 2016-05-17: qty 5000

## 2016-05-17 MED ORDER — ROCURONIUM BROMIDE 100 MG/10ML IV SOLN
INTRAVENOUS | Status: DC | PRN
  Administered 2016-05-17: 50 mg via INTRAVENOUS
  Administered 2016-05-17: 10 mg via INTRAVENOUS
  Administered 2016-05-17 (×2): 20 mg via INTRAVENOUS

## 2016-05-17 MED ORDER — OXYCODONE HCL 5 MG PO TABS
5.0000 mg | ORAL_TABLET | ORAL | Status: DC | PRN
  Administered 2016-05-17 – 2016-05-18 (×3): 10 mg via ORAL
  Filled 2016-05-17 (×3): qty 2

## 2016-05-17 MED ORDER — HYDROCODONE-ACETAMINOPHEN 5-325 MG PO TABS
1.0000 | ORAL_TABLET | ORAL | Status: DC | PRN
  Administered 2016-05-18: 2 via ORAL
  Filled 2016-05-17: qty 2

## 2016-05-17 MED ORDER — PHENYLEPHRINE HCL 10 MG/ML IJ SOLN
INTRAMUSCULAR | Status: AC
  Filled 2016-05-17: qty 1

## 2016-05-17 MED ORDER — MENTHOL 3 MG MT LOZG: 1.0000 | LOZENGE | OROMUCOSAL | Status: DC | PRN

## 2016-05-17 MED ORDER — DEXAMETHASONE SODIUM PHOSPHATE 10 MG/ML IJ SOLN
INTRAMUSCULAR | Status: DC | PRN
  Administered 2016-05-17: 10 mg via INTRAVENOUS

## 2016-05-17 MED ORDER — PHENYLEPHRINE HCL 10 MG/ML IJ SOLN
INTRAMUSCULAR | Status: DC | PRN
  Administered 2016-05-17 (×4): 80 ug via INTRAVENOUS
  Administered 2016-05-17: 40 ug via INTRAVENOUS

## 2016-05-17 MED ORDER — PROPOFOL 10 MG/ML IV BOLUS
INTRAVENOUS | Status: AC
  Filled 2016-05-17: qty 20

## 2016-05-17 MED ORDER — ESCITALOPRAM OXALATE 20 MG PO TABS
20.0000 mg | ORAL_TABLET | Freq: Every day | ORAL | Status: DC
  Administered 2016-05-18: 20 mg via ORAL
  Filled 2016-05-17: qty 1

## 2016-05-17 MED ORDER — BUPIVACAINE-EPINEPHRINE (PF) 0.5% -1:200000 IJ SOLN
INTRAMUSCULAR | Status: AC
  Filled 2016-05-17: qty 30

## 2016-05-17 MED ORDER — ACETAMINOPHEN 650 MG RE SUPP: 650.0000 mg | RECTAL | Status: DC | PRN

## 2016-05-17 MED ORDER — OMEPRAZOLE MAGNESIUM 20 MG PO TBEC: 20.0000 mg | DELAYED_RELEASE_TABLET | Freq: Every day | ORAL | Status: DC

## 2016-05-17 MED ORDER — ACETAMINOPHEN 325 MG PO TABS: 650.0000 mg | ORAL_TABLET | ORAL | Status: DC | PRN

## 2016-05-17 MED ORDER — LACTATED RINGERS IV SOLN
INTRAVENOUS | Status: DC
  Administered 2016-05-17 (×2): via INTRAVENOUS

## 2016-05-17 MED ORDER — ALBUMIN HUMAN 5 % IV SOLN
INTRAVENOUS | Status: DC | PRN
  Administered 2016-05-17: 11:00:00 via INTRAVENOUS

## 2016-05-17 MED ORDER — EPHEDRINE SULFATE 50 MG/ML IJ SOLN
INTRAMUSCULAR | Status: DC | PRN
  Administered 2016-05-17 (×4): 10 mg via INTRAVENOUS

## 2016-05-17 MED ORDER — SODIUM CHLORIDE 0.9 % IV SOLN
500.0000 mg | Freq: Once | INTRAVENOUS | Status: AC
  Administered 2016-05-18: 500 mg via INTRAVENOUS
  Filled 2016-05-17 (×2): qty 500

## 2016-05-17 MED ORDER — FENTANYL CITRATE (PF) 250 MCG/5ML IJ SOLN
INTRAMUSCULAR | Status: AC
  Filled 2016-05-17: qty 5

## 2016-05-17 MED ORDER — HYDROMORPHONE HCL 1 MG/ML IJ SOLN
INTRAMUSCULAR | Status: AC
  Filled 2016-05-17: qty 0.5

## 2016-05-17 MED ORDER — VANCOMYCIN HCL 1000 MG IV SOLR
INTRAVENOUS | Status: AC
  Filled 2016-05-17: qty 1000

## 2016-05-17 MED ORDER — BUPIVACAINE LIPOSOME 1.3 % IJ SUSP
20.0000 mL | Freq: Once | INTRAMUSCULAR | Status: AC
  Administered 2016-05-17: 20 mL
  Filled 2016-05-17: qty 20

## 2016-05-17 MED ORDER — ALPRAZOLAM 0.5 MG PO TABS
0.5000 mg | ORAL_TABLET | Freq: Two times a day (BID) | ORAL | Status: DC
  Administered 2016-05-17 – 2016-05-18 (×2): 0.5 mg via ORAL
  Filled 2016-05-17 (×2): qty 1

## 2016-05-17 MED ORDER — PROPOFOL 10 MG/ML IV BOLUS
INTRAVENOUS | Status: DC | PRN
  Administered 2016-05-17: 90 mg via INTRAVENOUS

## 2016-05-17 MED ORDER — AMITRIPTYLINE HCL 75 MG PO TABS
75.0000 mg | ORAL_TABLET | Freq: Every day | ORAL | Status: DC
  Administered 2016-05-17: 75 mg via ORAL
  Filled 2016-05-17: qty 1

## 2016-05-17 MED FILL — Sodium Chloride IV Soln 0.9%: INTRAVENOUS | Qty: 1000 | Status: AC

## 2016-05-17 MED FILL — Heparin Sodium (Porcine) Inj 1000 Unit/ML: INTRAMUSCULAR | Qty: 30 | Status: AC

## 2016-05-17 SURGICAL SUPPLY — 68 items
BAG DECANTER FOR FLEXI CONT (MISCELLANEOUS) ×2 IMPLANT
BASKET BONE COLLECTION (BASKET) ×2 IMPLANT
BENZOIN TINCTURE PRP APPL 2/3 (GAUZE/BANDAGES/DRESSINGS) ×2 IMPLANT
BLADE CLIPPER SURG (BLADE) IMPLANT
BLADE SURG 15 STRL LF DISP TIS (BLADE) ×1 IMPLANT
BLADE SURG 15 STRL SS (BLADE) ×1
BUR MATCHSTICK NEURO 3.0 LAGG (BURR) ×2 IMPLANT
BUR PRECISION FLUTE 6.0 (BURR) ×2 IMPLANT
CAGE ALTERA 8X12-8 (Cage) ×2 IMPLANT
CANISTER SUCT 3000ML PPV (MISCELLANEOUS) ×2 IMPLANT
CAP REVERE LOCKING (Cap) ×20 IMPLANT
CARTRIDGE OIL MAESTRO DRILL (MISCELLANEOUS) ×1 IMPLANT
CONT SPEC 4OZ CLIKSEAL STRL BL (MISCELLANEOUS) ×2 IMPLANT
COVER BACK TABLE 60X90IN (DRAPES) ×4 IMPLANT
DIFFUSER DRILL AIR PNEUMATIC (MISCELLANEOUS) ×2 IMPLANT
DRAPE C-ARM 42X72 X-RAY (DRAPES) ×4 IMPLANT
DRAPE HALF SHEET 40X57 (DRAPES) ×2 IMPLANT
DRAPE LAPAROTOMY 100X72X124 (DRAPES) ×2 IMPLANT
DRAPE POUCH INSTRU U-SHP 10X18 (DRAPES) ×2 IMPLANT
DRAPE SURG 17X23 STRL (DRAPES) ×8 IMPLANT
ELECT BLADE 4.0 EZ CLEAN MEGAD (MISCELLANEOUS) ×2
ELECT REM PT RETURN 9FT ADLT (ELECTROSURGICAL) ×2
ELECTRODE BLDE 4.0 EZ CLN MEGD (MISCELLANEOUS) ×1 IMPLANT
ELECTRODE REM PT RTRN 9FT ADLT (ELECTROSURGICAL) ×1 IMPLANT
EVACUATOR 1/8 PVC DRAIN (DRAIN) IMPLANT
GAUZE SPONGE 4X4 12PLY STRL (GAUZE/BANDAGES/DRESSINGS) ×2 IMPLANT
GAUZE SPONGE 4X4 12PLY STRL LF (GAUZE/BANDAGES/DRESSINGS) ×2 IMPLANT
GAUZE SPONGE 4X4 16PLY XRAY LF (GAUZE/BANDAGES/DRESSINGS) ×2 IMPLANT
GEL BIOACTIVE 10CC (Miscellaneous) ×2 IMPLANT
GLOVE BIO SURGEON STRL SZ8 (GLOVE) ×6 IMPLANT
GLOVE BIO SURGEON STRL SZ8.5 (GLOVE) ×6 IMPLANT
GLOVE BIOGEL PI IND STRL 7.5 (GLOVE) ×2 IMPLANT
GLOVE BIOGEL PI INDICATOR 7.5 (GLOVE) ×2
GLOVE ECLIPSE 9.0 STRL (GLOVE) ×2 IMPLANT
GLOVE EXAM NITRILE LRG STRL (GLOVE) IMPLANT
GLOVE EXAM NITRILE XL STR (GLOVE) IMPLANT
GLOVE EXAM NITRILE XS STR PU (GLOVE) IMPLANT
GLOVE SURG SS PI 7.0 STRL IVOR (GLOVE) ×2 IMPLANT
GOWN STRL REUS W/ TWL LRG LVL3 (GOWN DISPOSABLE) IMPLANT
GOWN STRL REUS W/ TWL XL LVL3 (GOWN DISPOSABLE) ×3 IMPLANT
GOWN STRL REUS W/TWL 2XL LVL3 (GOWN DISPOSABLE) IMPLANT
GOWN STRL REUS W/TWL LRG LVL3 (GOWN DISPOSABLE)
GOWN STRL REUS W/TWL XL LVL3 (GOWN DISPOSABLE) ×3
KIT BASIN OR (CUSTOM PROCEDURE TRAY) ×2 IMPLANT
KIT ROOM TURNOVER OR (KITS) ×2 IMPLANT
NEEDLE HYPO 21X1.5 SAFETY (NEEDLE) IMPLANT
NEEDLE HYPO 22GX1.5 SAFETY (NEEDLE) ×2 IMPLANT
NS IRRIG 1000ML POUR BTL (IV SOLUTION) ×2 IMPLANT
OIL CARTRIDGE MAESTRO DRILL (MISCELLANEOUS) ×2
PACK LAMINECTOMY NEURO (CUSTOM PROCEDURE TRAY) ×2 IMPLANT
PAD ARMBOARD 7.5X6 YLW CONV (MISCELLANEOUS) ×6 IMPLANT
PATTIES SURGICAL .5 X1 (DISPOSABLE) IMPLANT
PATTIES SURGICAL 1X1 (DISPOSABLE) ×2 IMPLANT
ROD REVERE 6.35 STRAIGHT 125MM (Rod) ×4 IMPLANT
SCREW REVERE 6.5X50MM (Screw) ×4 IMPLANT
SPONGE LAP 4X18 X RAY DECT (DISPOSABLE) IMPLANT
SPONGE NEURO XRAY DETECT 1X3 (DISPOSABLE) IMPLANT
SPONGE SURGIFOAM ABS GEL 100 (HEMOSTASIS) ×2 IMPLANT
STRIP CLOSURE SKIN 1/2X4 (GAUZE/BANDAGES/DRESSINGS) ×2 IMPLANT
SUT VIC AB 1 CT1 18XBRD ANBCTR (SUTURE) ×2 IMPLANT
SUT VIC AB 1 CT1 8-18 (SUTURE) ×2
SUT VIC AB 2-0 CP2 18 (SUTURE) ×4 IMPLANT
SYR 20CC LL (SYRINGE) ×2 IMPLANT
TAPE CLOTH SURG 4X10 WHT LF (GAUZE/BANDAGES/DRESSINGS) ×2 IMPLANT
TOWEL GREEN STERILE (TOWEL DISPOSABLE) ×2 IMPLANT
TOWEL GREEN STERILE FF (TOWEL DISPOSABLE) ×2 IMPLANT
TRAY FOLEY W/METER SILVER 16FR (SET/KITS/TRAYS/PACK) ×2 IMPLANT
WATER STERILE IRR 1000ML POUR (IV SOLUTION) ×2 IMPLANT

## 2016-05-17 NOTE — Progress Notes (Signed)
Orthopedic Tech Progress Note Patient Details:  Carrie Austin 06/19/1945 [id number] Patient already has brace. Patient ID: Carrie Austin, female   DOB: Jul 20, 1945, 71 y.o.   MRN: [id number]   Jennye Moccasin 05/17/2016, 5:36 PM

## 2016-05-17 NOTE — Transfer of Care (Signed)
Immediate Anesthesia Transfer of Care Note  Patient: Carrie Austin  Procedure(s) Performed: Procedure(s): POSTERIOR LUMBAR INTERBODY FUSION, INTERBODY PROSTHESIS, POSTERIOR LATERAL ARTHRODESIS LUMBAR TWO - LUMBAR THREE  EXPLORE FUSION (N/A)  Patient Location: PACU  Anesthesia Type:General  Level of Consciousness: awake, alert , oriented and patient cooperative  Airway & Oxygen Therapy: Patient Spontanous Breathing and Patient connected to nasal cannula oxygen  Post-op Assessment: Report given to RN, Post -op Vital signs reviewed and stable and Patient moving all extremities X 4  Post vital signs: Reviewed and stable  Last Vitals:  Vitals:   05/17/16 1008 05/17/16 1011  BP:  135/62  Pulse: (!) 56   Resp: 20   Temp: 37 C     Last Pain:  Vitals:   05/17/16 1008  TempSrc: Oral         Complications: No apparent anesthesia complications

## 2016-05-17 NOTE — Anesthesia Postprocedure Evaluation (Signed)
Anesthesia Post Note  Patient: Carrie Austin  Procedure(s) Performed: Procedure(s) (LRB): POSTERIOR LUMBAR INTERBODY FUSION, INTERBODY PROSTHESIS, POSTERIOR LATERAL ARTHRODESIS LUMBAR TWO - LUMBAR THREE  EXPLORE FUSION (N/A)  Patient location during evaluation: PACU Anesthesia Type: General Level of consciousness: awake and alert Pain management: pain level controlled Vital Signs Assessment: post-procedure vital signs reviewed and stable Respiratory status: spontaneous breathing, nonlabored ventilation and respiratory function stable Cardiovascular status: blood pressure returned to baseline and stable Postop Assessment: no signs of nausea or vomiting Anesthetic complications: no       Last Vitals:  Vitals:   05/17/16 1615 05/17/16 1620  BP:  133/64  Pulse:  100  Resp:  18  Temp: 36.7 C 36.7 C    Last Pain:  Vitals:   05/17/16 1620  TempSrc: Oral  PainSc:                  Znya Albino,W. EDMOND

## 2016-05-17 NOTE — Anesthesia Procedure Notes (Signed)
Procedure Name: Intubation Performed by: Valda Favia Pre-anesthesia Checklist: Patient identified, Emergency Drugs available, Suction available, Patient being monitored and Timeout performed Patient Re-evaluated:Patient Re-evaluated prior to inductionOxygen Delivery Method: Circle system utilized Preoxygenation: Pre-oxygenation with 100% oxygen Intubation Type: IV induction Ventilation: Mask ventilation without difficulty Laryngoscope Size: Mac and 3 Grade View: Grade I Tube type: Oral Tube size: 7.0 mm Number of attempts: 1 Airway Equipment and Method: Stylet Placement Confirmation: ETT inserted through vocal cords under direct vision,  positive ETCO2 and breath sounds checked- equal and bilateral Secured at: 20 cm Tube secured with: Tape Dental Injury: Teeth and Oropharynx as per pre-operative assessment

## 2016-05-17 NOTE — H&P (Signed)
Subjective: The patient is a 71 year old white female on whom I performed a lumbar instrumentation and fusion. She did well for years but has developed recurrent back, buttock and leg pain consistent with neurogenic claudication. She has failed medical management and was worked up with lumbar x-rays in the lumbar MRI which demonstrated severe adjacent segment stenosis at L2-3. I discussed the various treatment options with the patient including surgery. She has weighed the risks, benefits, and alternatives to surgery and decided to proceed with an L2-3 decompression, instrument taking, and fusion.   Past Medical History:  Diagnosis Date  . Allergy   . Anxiety   . Arthritis   . Cough    due allergies chronic  . Depression   . Dilatation of esophagus   . Fibromyalgia   . GERD (gastroesophageal reflux disease)   . Headache(784.0)   . Heart murmur    one time  . Hypertension   . PONV (postoperative nausea and vomiting)    hx of Vertigo after anesthesia    Past Surgical History:  Procedure Laterality Date  . ABDOMINAL HYSTERECTOMY    . BACK SURGERY  12/21/12  . CHOLECYSTECTOMY    . COLONOSCOPY    . EYE SURGERY Bilateral    lasik    Allergies  Allergen Reactions  . Lisinopril Cough    Deep cough  . Amoxicillin Rash    Has patient had a PCN reaction causing immediate rash, facial/tongue/throat swelling, SOB or lightheadedness with hypotension:unsure Has patient had a PCN reaction causing severe rash involving mucus membranes or skin necrosis:unsure Has patient had a PCN reaction that required hospitalization:NO  . Has patient had a PCN reaction occurring within the last 10 years:    Yes If all of the above answers are "NO", then may proceed with Cephalosporin use.   . Cephalosporins Other (See Comments)    Flushing of the face    Social History  Substance Use Topics  . Smoking status: Never Smoker  . Smokeless tobacco: Never Used  . Alcohol use No     Comment: social     History reviewed. No pertinent family history. Prior to Admission medications   Medication Sig Start Date End Date Taking? Authorizing Provider  acetaminophen (TYLENOL) 500 MG tablet Take 500 mg by mouth every 6 (six) hours as needed (for headache/pain.).   Yes [provider]  ALPRAZolam Prudy Feeler) 0.5 MG tablet Take 0.5 mg by mouth 2 (two) times daily.   Yes [provider]  amitriptyline (ELAVIL) 25 MG tablet Take 75 mg by mouth at bedtime. (SCHEDULED EVERY NIGHT--PATIENT WILL NOT SLEEP W/O THIS MEDICATION)   Yes [provider]  Artificial Tear Ointment (SOOTHE NIGHT TIME) OINT Place 1 application into both eyes at bedtime.   Yes [provider]  atorvastatin (LIPITOR) 20 MG tablet Take 20 mg by mouth every Wednesday.    Yes [provider]  Biotin w/ Vitamins C & E (HAIR SKIN & NAILS GUMMIES PO) Take 2 tablets by mouth daily.   Yes [provider]  CALCIUM-VITAMIN D-VITAMIN K PO Take 1 tablet by mouth daily with lunch.   Yes [provider]  Cholecalciferol (VITAMIN D) 2000 UNITS CAPS Take 2,000 Units by mouth daily.   Yes [provider]  COD LIVER OIL PO Take 5 mLs by mouth daily.   Yes [provider]  docusate sodium (RA COL-RITE) 100 MG capsule Take 100 mg by mouth 2 (two) times daily.   Yes [provider]  escitalopram (LEXAPRO) 20 MG tablet Take 20 mg by mouth daily.   Yes [provider]  gabapentin (NEURONTIN) 100 MG capsule Take 100 mg by mouth 2 (two) times daily.   Yes [provider]  HYDROcodone-acetaminophen (NORCO/VICODIN) 5-325 MG tablet Take 1-2 tablets by mouth every 4 (four) hours as needed (for pain.).   Yes [provider]  losartan (COZAAR) 100 MG tablet Take 100 mg by mouth daily.   Yes [provider]  omeprazole (PRILOSEC OTC) 20 MG tablet Take 20 mg by mouth daily before breakfast.   Yes [provider]  Propylene Glycol (SYSTANE  BALANCE) 0.6 % SOLN Place 1 drop into both eyes 4 (four) times daily as needed (for dry/irritated eyes (scheduled EVERY MORNING)).   Yes [provider]  clobetasol ointment (TEMOVATE) 0.05 % Apply 1 application topically 2 (two) times daily as needed (for rash).    [provider]  hydrocortisone 2.5 % cream Apply 1 application topically 2 (two) times daily as needed (for skin irritation/rash).     [provider]  Magnesium 250 MG TABS Take 250 mg by mouth daily.    [provider]     Review of Systems  Positive ROS: As above  All other systems have been reviewed and were otherwise negative with the exception of those mentioned in the HPI and as above.  Objective: Vital signs in last 24 hours: Temp:  [98.6 F (37 C)] 98.6 F (37 C) (05/07 1008) Pulse Rate:  [56] 56 (05/07 1008) Resp:  [20] 20 (05/07 1008) BP: (135)/(62) 135/62 (05/07 1011) SpO2:  [98 %] 98 % (05/07 1008) Weight:  [44.5 kg (98 lb)] 44.5 kg (98 lb) (05/07 1008)  General Appearance: Alert Head: Normocephalic, without obvious abnormality, atraumatic Eyes: PERRL, conjunctiva/corneas clear, EOM's intact,    Ears: Normal  Throat: Normal  Neck: Supple, Back: unremarkable, the patient's lumbar incision is well-healed. Lungs: Clear to auscultation bilaterally, respirations unlabored Heart: Regular rate and rhythm, no murmur, rub or gallop Abdomen: Soft, non-tender Extremities: Extremities normal, atraumatic, no cyanosis or edema Skin: unremarkable  NEUROLOGIC:   Mental status: alert and oriented,Motor Exam - grossly normal Sensory Exam - grossly normal Reflexes:  Coordination - grossly normal Gait - grossly normal Balance - grossly normal Cranial Nerves: I: smell Not tested  II: visual acuity  OS: Normal  OD: Normal   II: visual fields Full to confrontation  II: pupils Equal, round, reactive to light  III,VII: ptosis None  III,IV,VI: extraocular muscles  Full ROM  V:  mastication Normal  V: facial light touch sensation  Normal  V,VII: corneal reflex  Present  VII: facial muscle function - upper  Normal  VII: facial muscle function - lower Normal  VIII: hearing Not tested  IX: soft palate elevation  Normal  IX,X: gag reflex Present  XI: trapezius strength  5/5  XI: sternocleidomastoid strength 5/5  XI: neck flexion strength  5/5  XII: tongue strength  Normal    Data Review Lab Results  Component Value Date   WBC 4.7 05/10/2016   HGB 13.0 05/10/2016   HCT 38.8 05/10/2016   MCV 86.4 05/10/2016   PLT 173 05/10/2016   Lab Results  Component Value Date   NA 137 05/10/2016   K 3.6 05/10/2016   CL 102 05/10/2016   CO2 28 05/10/2016   BUN 9 05/10/2016   CREATININE 0.65 05/10/2016   GLUCOSE 70 05/10/2016   No results found for: INR, PROTIME  Assessment/Plan:  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 I have reviewed her imaging studies with her and pointed out the abnormalities. We have discussed the various treatment options including surgery. I have described the surgical treatment option of an exploration of her lumbar fusion with an L2-3 decompression, instrumentation, and fusion. I have shown her surgical models. We have discussed the risks, benefits, alternatives, expected postoperative course, and likelihood of achieving her goals with surgery. I have answered all her questions. She has decided to proceed with surgery.   Carrie Austin D 05/17/2016 10:53 AM

## 2016-05-17 NOTE — Progress Notes (Signed)
Patient ID: Carrie Austin, female   DOB: 04/30/1945, 71 y.o.   MRN: [id number] Subjective:  The patient is alert and pleasant. She is in no apparent distress.  Objective: Vital signs in last 24 hours: Temp:  [97.7 F (36.5 C)-98.6 F (37 C)] 97.7 F (36.5 C) (05/07 1515) Pulse Rate:  [56] 56 (05/07 1008) Resp:  [20] 20 (05/07 1008) BP: (135)/(62) 135/62 (05/07 1011) SpO2:  [98 %] 98 % (05/07 1008) Weight:  [44.5 kg (98 lb)] 44.5 kg (98 lb) (05/07 1008)  Intake/Output from previous day: No intake/output data recorded. Intake/Output this shift: Total I/O In: 1850 [I.V.:1600; IV Piggyback:250] Out: 840 [Urine:690; Blood:150]  Physical exam the patient is alert and pleasant. She is moving her lower extremity well.  Lab Results: No results for input(s): WBC, HGB, HCT, PLT in the last 72 hours. BMET No results for input(s): NA, K, CL, CO2, GLUCOSE, BUN, CREATININE, CALCIUM in the last 72 hours.  Studies/Results: Dg Lumbar Spine 2-3 Views  Result Date: 05/17/2016 CLINICAL DATA:  L3-L4 PLIF EXAM: DG C-ARM 61-120 MIN; LUMBAR SPINE - 2-3 VIEW COMPARISON:  Lumbar spine MRI 03/05/2016 FINDINGS: Fluoroscopic images obtained during posterior lumbar interbody fusion surely new interbody prosthesis at the L3-L4 level. Spinal rods are no longer visualized at this level. Fluoroscopy time was reported as 14 seconds. IMPRESSION: Intraoperative fluoroscopy during surgery of the L3-L4 level. Electronically Signed   By: Deatra Robinson M.D.   On: 05/17/2016 14:15   Dg C-arm 1-60 Min  Result Date: 05/17/2016 CLINICAL DATA:  L3-L4 PLIF EXAM: DG C-ARM 61-120 MIN; LUMBAR SPINE - 2-3 VIEW COMPARISON:  Lumbar spine MRI 03/05/2016 FINDINGS: Fluoroscopic images obtained during posterior lumbar interbody fusion surely new interbody prosthesis at the L3-L4 level. Spinal rods are no longer visualized at this level. Fluoroscopy time was reported as 14 seconds. IMPRESSION: Intraoperative fluoroscopy during surgery of  the L3-L4 level. Electronically Signed   By: Deatra Robinson M.D.   On: 05/17/2016 14:15    Assessment/Plan: The patient is doing well.  LOS: 0 days     Samiya Mervin D 05/17/2016, 3:30 PM

## 2016-05-17 NOTE — Anesthesia Preprocedure Evaluation (Addendum)
Anesthesia Evaluation  Patient identified by MRN, date of birth, ID band Patient awake    Reviewed: Allergy & Precautions, H&P , NPO status , Patient's Chart, lab work & pertinent test results  History of Anesthesia Complications (+) PONV  Airway Mallampati: I  TM Distance: >3 FB Neck ROM: Full    Dental no notable dental hx. (+) Teeth Intact, Dental Advisory Given   Pulmonary neg pulmonary ROS,    Pulmonary exam normal breath sounds clear to auscultation       Cardiovascular hypertension, Pt. on medications  Rhythm:Regular Rate:Normal     Neuro/Psych  Headaches, Anxiety Depression    GI/Hepatic Neg liver ROS, GERD  Medicated and Controlled,  Endo/Other  negative endocrine ROS  Renal/GU negative Renal ROS  negative genitourinary   Musculoskeletal  (+) Arthritis , Osteoarthritis,  Fibromyalgia -  Abdominal   Peds  Hematology negative hematology ROS (+)   Anesthesia Other Findings   Reproductive/Obstetrics negative OB ROS                            Anesthesia Physical Anesthesia Plan  ASA: II  Anesthesia Plan: General   Post-op Pain Management:    Induction: Intravenous  Airway Management Planned: Oral ETT  Additional Equipment:   Intra-op Plan:   Post-operative Plan: Extubation in OR  Informed Consent: I have reviewed the patients History and Physical, chart, labs and discussed the procedure including the risks, benefits and alternatives for the proposed anesthesia with the patient or authorized representative who has indicated his/her understanding and acceptance.   Dental advisory given  Plan Discussed with: CRNA  Anesthesia Plan Comments:         Anesthesia Quick Evaluation

## 2016-05-17 NOTE — Progress Notes (Signed)
71 YO F s/p spinal surgery, on vancomycin for surgical prophylaxis d/t cephalosporin/amoxicillin allergy. Scr 0.65  on 4/30, est. crcl ~ 45-50 ml/min. Received pre-op dose 1g vancomycin at ~ 1115. No drain per surgery note  Plan: Vancomycin 500 mg x1 at midnight. Pharmacy sign off.  Thanks.   Bayard Hugger, PharmD, BCPS  Clinical Pharmacist  Pager: [phone]

## 2016-05-17 NOTE — Op Note (Signed)
Brief history: The patient is a 71 year old white female on whom I performed an L3-4, L4-5 and L5-S1 decompression, instrumentation, and fusion years ago. She initially did well but then developed recurrent back, buttock and leg pain consistent with neurogenic claudication. The patient was worked up with a lumbar MRI and lumbar x-rays. This demonstrated severe spinal stenosis at L2-3. I discussed the various treatment options with the patient. She has weighed the risks, benefits, and alternatives to surgery and decided proceed with an L2-3 decompression, agitation, and fusion with exploration of her lumbar fusion  Preoperative diagnosis: L2-3 Degenerative disc disease, spinal stenosis compressing both the L2 and the L3 nerve roots; lumbago; lumbar radiculopathy; neurogenic claudication  Postoperative diagnosis: The same  Procedure: Bilateral L2-3 Laminotomy/foraminotomies to decompress the bilateral L2 and L3 nerve roots(the work required to do this was in addition to the work required to do the posterior lumbar interbody fusion because of the patient's spinal stenosis, facet arthropathy. Etc. requiring a wide decompression of the nerve roots.); L2-3 transforaminal lumbar interbody fusion with local morselized autograft bone and Kinnex graft extender; insertion of interbody prosthesis at L2-3 (globus peek expandable interbody prosthesis); posterior segmental instrumentation from L2 to S1 with globus titanium pedicle screws and rods; posterior lateral arthrodesis at L2-3 with local morselized autograft bone and Kinnex bone graft extender, exploration of lumbar fusion  Surgeon: Dr. Delma Officer  Asst.: Dr. Jordan Likes  Anesthesia: Gen. endotracheal  Estimated blood loss: 250 mL  Drains: None  Complications: None  Description of procedure: The patient was brought to the operating room by the anesthesia team. General endotracheal anesthesia was induced. The patient was turned to the prone position on the  Wilson frame. The patient's lumbosacral region was then prepared with Betadine scrub and Betadine solution. Sterile drapes were applied.  I then injected the area to be incised with Marcaine with epinephrine solution. I then used the scalpel to make a linear midline incision over the L2-3, L3-4, L4-5 and L5-S1 interspace. I then used electrocautery to perform a bilateral subperiosteal dissection exposing the spinous process and lamina of L2 to the upper sacrum and exposing the old hardware.  We then inserted the Verstrac retractor to provide exposure. I explored the fusion by removing FROM the pedicle screws, then removing the rod. I then attempted to independently move the spinous processes. It appeared the fusion at L3-4, L4-5 and L5-S1 was  solid.  I began the decompression by using the high speed drill to perform laminotomies at L2-3 bilaterally. We then used the Kerrison punches to widen the laminotomy and removed the ligamentum flavum at L2-3 bilaterally. We used the Kerrison punches to remove the medial facets at L2-3 bilaterally. We performed wide foraminotomies about the bilateral L2 and L3 nerve roots completing the decompression.  We now turned our attention to the posterior lumbar interbody fusion. I used a scalpel to incise the intervertebral disc at L2-3 bilaterally. I then performed a partial intervertebral discectomy at L2-3 bilaterally using the pituitary forceps. We prepared the vertebral endplates at L2-3 bilaterally for the fusion by removing the soft tissues with the curettes. We then used the trial spacers to pick the appropriate sized interbody prosthesis. We prefilled his prosthesis with a combination of local morselized autograft bone that we obtained during the decompression as well as Kinnex bone graft extender. We inserted the prefilled prosthesis into the interspace at L2-3 bilaterally, we then expandable prosthesis. There was a good snug fit of the prosthesis in the interspace. We  then  filled and the remainder of the intervertebral disc space with local morselized autograft bone and Kinnex. This completed the posterior lumbar interbody arthrodesis.  We now turned attention to the instrumentation. Under fluoroscopic guidance we cannulated the bilateral L2 pedicles with the bone probe. We then removed the bone probe. We then tapped the pedicle with a 5.5 millimeter tap. We then removed the tap. We probed inside the tapped pedicle with a ball probe to rule out cortical breaches. We then inserted a 6.5 x 50 millimeter pedicle screw into the L2 pedicles bilaterally under fluoroscopic guidance. We then palpated along the medial aspect of the pedicles to rule out cortical breaches. There were none. The nerve roots were not injured. We then connected the unilateral pedicle screws from L2-S1 with a lordotic rod. We compressed the construct and secured the rod in place with the caps. We then tightened the caps appropriately. This completed the instrumentation from L2-S1 bilaterally.  We now turned our attention to the posterior lateral arthrodesis at L2-3. We used the high-speed drill to decorticate the remainder of the facets, pars, transverse process at L2-3. We then applied a combination of local morselized autograft bone and Kinnex bone graft extender over these decorticated posterior lateral structures. This completed the posterior lateral arthrodesis.  We then obtained hemostasis using bipolar electrocautery. We irrigated the wound out with bacitracin solution. We inspected the thecal sac and nerve roots and noted they were well decompressed. We then removed the retractor. We placed vancomycin powder in the wound. We reapproximated patient's thoracolumbar fascia with interrupted #1 Vicryl suture. We reapproximated patient's subcutaneous tissue with interrupted 2-0 Vicryl suture. The reapproximated patient's skin with Steri-Strips and benzoin. The wound was then coated with bacitracin  ointment. A sterile dressing was applied. The drapes were removed. The patient was subsequently returned to the supine position where they were extubated by the anesthesia team. He was then transported to the post anesthesia care unit in stable condition. All sponge instrument and needle counts were reportedly correct at the end of this case.

## 2016-05-18 LAB — BASIC METABOLIC PANEL
Anion gap: 7 (ref 5–15)
BUN: 8 mg/dL (ref 6–20)
CO2: 30 mmol/L (ref 22–32)
Calcium: 8.7 mg/dL — ABNORMAL LOW (ref 8.9–10.3)
Chloride: 100 mmol/L — ABNORMAL LOW (ref 101–111)
Creatinine, Ser: 0.77 mg/dL (ref 0.44–1.00)
GFR calc Af Amer: >60 mL/min (ref >60)
GFR calc non Af Amer: >60 mL/min (ref >60)
Glucose, Bld: 115 mg/dL — ABNORMAL HIGH (ref 65–99)
Potassium: 4.1 mmol/L (ref 3.5–5.1)
Sodium: 137 mmol/L (ref 135–145)

## 2016-05-18 LAB — CBC
HCT: 31.2 % — ABNORMAL LOW (ref 36.0–46.0)
Hemoglobin: 10.9 g/dL — ABNORMAL LOW (ref 12.0–15.0)
MCH: 30.5 pg (ref 26.0–34.0)
MCHC: 34.9 g/dL (ref 30.0–36.0)
MCV: 87.4 fL (ref 78.0–100.0)
Platelets: 171 10*3/uL (ref 150–400)
RBC: 3.57 MIL/uL — ABNORMAL LOW (ref 3.87–5.11)
RDW: 13.6 % (ref 11.5–15.5)
WBC: 9.7 10*3/uL (ref 4.0–10.5)

## 2016-05-18 MED ORDER — CYCLOBENZAPRINE HCL 10 MG PO TABS: 10.0000 mg | ORAL_TABLET | Freq: Three times a day (TID) | ORAL | 1 refills | Status: DC | PRN

## 2016-05-18 NOTE — Evaluation (Signed)
Physical Therapy Evaluation Patient Details Name: Carrie Austin MRN: [id number] DOB: March 01, 1945 Today's Date: 05/18/2016   History of Present Illness  Pt is a 71 y/o female who presents s/p L3-S1 PLIF on 05/17/16.   Clinical Impression  Pt admitted with above diagnosis. Pt currently with functional limitations due to the deficits listed below (see PT Problem List). At the time of PT eval pt was able to perform transfers and ambulation with gross modified independence to supervision for safety. Pt states she feels she may be able to return home this afternoon but wants to discuss with her significant other when he gets here. If pt is still here tomorrow will see for one more visit for gait and stair training. Pt will benefit from skilled PT to increase their independence and safety with mobility to allow discharge to the venue listed below.       Follow Up Recommendations No PT follow up    Equipment Recommendations  None recommended by PT    Recommendations for Other Services       Precautions / Restrictions Precautions Precautions: Fall;Back Precaution Booklet Issued: Yes (comment) Precaution Comments: Reviewed handout. Pt was cued for precautions during functional mobility.  Required Braces or Orthoses: Spinal Brace Spinal Brace: Lumbar corset;Applied in sitting position Restrictions Weight Bearing Restrictions: No      Mobility  Bed Mobility Overal bed mobility: Modified Independent Bed Mobility: Rolling;Sidelying to Sit;Sit to Sidelying Rolling: Modified independent (Device/Increase time) Sidelying to sit: Modified independent (Device/Increase time)     Sit to sidelying: Modified independent (Device/Increase time) General bed mobility comments: No assist required. HOB flat and rails lowered to simulate home environment.   Transfers Overall transfer level: Modified independent Equipment used: None Transfers: Sit to/from Stand Sit to Stand: Modified independent  (Device/Increase time)         General transfer comment: No assist required. No unsteadiness noted.   Ambulation/Gait Ambulation/Gait assistance: Min guard;Supervision Ambulation Distance (Feet): 300 Feet Assistive device: None Gait Pattern/deviations: Step-through pattern;Decreased stride length;Narrow base of support Gait velocity: Decreased Gait velocity interpretation: Below normal speed for age/gender General Gait Details: Slow but generally steady gait. Initially min guard provided for safety and pt progressed to supervision level.   Stairs Stairs: Yes Stairs assistance: Min guard Stair Management: One rail Left;Step to pattern;Forwards Number of Stairs: 1 (x2 trials) General stair comments: VC's for sequencing and safety  Wheelchair Mobility    Modified Rankin (Stroke Patients Only)       Balance Overall balance assessment: No apparent balance deficits (not formally assessed)                                           Pertinent Vitals/Pain Pain Assessment: Faces Faces Pain Scale: Hurts little more Pain Location: Incision site Pain Descriptors / Indicators: Operative site guarding;Discomfort;Aching Pain Intervention(s): Limited activity within patient's tolerance;Monitored during session;Repositioned    Home Living Family/patient expects to be discharged to:: Private residence Living Arrangements: Alone Available Help at Discharge: Family;Available 24 hours/day (Significant Other) Type of Home: House (Downstairs condo) Home Access: Stairs to enter   [employer] of Steps: 1 Home Layout: One level Home Equipment: Cane - single point;Shower seat - built in;Hand held shower head;Grab bars - tub/shower      Prior Function Level of Independence: Independent               Hand  Dominance   Dominant Hand: Right    Extremity/Trunk Assessment   Upper Extremity Assessment Upper Extremity Assessment: Overall WFL for tasks  assessed    Lower Extremity Assessment Lower Extremity Assessment: Overall WFL for tasks assessed    Cervical / Trunk Assessment Cervical / Trunk Assessment: Normal (s/p surgery)  Communication   Communication: No difficulties  Cognition Arousal/Alertness: Awake/alert Behavior During Therapy: WFL for tasks assessed/performed Overall Cognitive Status: Within Functional Limits for tasks assessed                                        General Comments      Exercises     Assessment/Plan    PT Assessment Patient needs continued PT services  PT Problem List Decreased strength;Decreased range of motion;Decreased activity tolerance;Decreased balance;Decreased mobility;Decreased knowledge of use of DME;Decreased safety awareness;Decreased knowledge of precautions;Pain       PT Treatment Interventions DME instruction;Gait training;Stair training;Functional mobility training;Therapeutic exercise;Therapeutic activities;Neuromuscular re-education;Patient/family education    PT Goals (Current goals can be found in the Care Plan section)  Acute Rehab PT Goals Patient Stated Goal: Home today PT Goal Formulation: With patient Time For Goal Achievement: 05/25/16 Potential to Achieve Goals: Good    Frequency Min 5X/week   Barriers to discharge        Co-evaluation               AM-PAC PT "6 Clicks" Daily Activity  Outcome Measure Difficulty turning over in bed (including adjusting bedclothes, sheets and blankets)?: None Difficulty moving from lying on back to sitting on the side of the bed? : None Difficulty sitting down on and standing up from a chair with arms (e.g., wheelchair, bedside commode, etc,.)?: None Help needed moving to and from a bed to chair (including a wheelchair)?: None Help needed walking in hospital room?: A Little Help needed climbing 3-5 steps with a railing? : A Little 6 Click Score: 22    End of Session Equipment Utilized During  Treatment: Back brace Activity Tolerance: Patient tolerated treatment well Patient left: in chair;with call bell/phone within reach Nurse Communication: Mobility status PT Visit Diagnosis: Unsteadiness on feet (R26.81);Pain Pain - part of body:  (Back)    Time: 1610-9604 PT Time Calculation (min) (ACUTE ONLY): 24 min   Charges:   PT Evaluation $PT Eval Moderate Complexity: 1 Procedure PT Treatments $Gait Training: 8-22 mins   PT G Codes:        Conni Slipper, PT, DPT Acute Rehabilitation Services Pager: [phone]   Marylynn Pearson 05/18/2016, 9:38 AM

## 2016-05-18 NOTE — Progress Notes (Signed)
Pt doing well. Pt and friend given D/C instructions with Rx's, verbal understanding was provided. Pt's incision is clean and dry with no sign of infection. Pt's IV was removed prior to D/C. Pt D/C'd home via wheelchair @ 1245 per MD order. Pt is stable @ D/C and has no other needs at this time. Rema Fendt, RN

## 2016-05-18 NOTE — Progress Notes (Signed)
Patient ID: Carrie Austin, female   DOB: 07-Oct-1945, 71 y.o.   MRN: [id number] Subjective:  The patient is alert and pleasant. She looks well. She wants to go home tomorrow.  Objective: Vital signs in last 24 hours: Temp:  [97.7 F (36.5 C)-98.6 F (37 C)] 98.4 F (36.9 C) (05/08 0500) Pulse Rate:  [56-100] 82 (05/08 0500) Resp:  [7-20] 18 (05/08 0500) BP: (104-153)/(42-68) 104/51 (05/08 0500) SpO2:  [95 %-100 %] 98 % (05/08 0500) Weight:  [44.5 kg (98 lb)] 44.5 kg (98 lb) (05/07 1008)  Intake/Output from previous day: 05/07 0701 - 05/08 0700 In: 1850 [I.V.:1600; IV Piggyback:250] Out: 1940 [Urine:1790; Blood:150] Intake/Output this shift: No intake/output data recorded.  Physical exam the patient is alert and pleasant. Her strength is normal.  Lab Results:  Recent Labs  05/18/16 0530  WBC 9.7  HGB 10.9*  HCT 31.2*  PLT 171   BMET  Recent Labs  05/18/16 0530  NA 137  K 4.1  CL 100*  CO2 30  GLUCOSE 115*  BUN 8  CREATININE 0.77  CALCIUM 8.7*    Studies/Results: Dg Lumbar Spine 2-3 Views  Result Date: 05/17/2016 CLINICAL DATA:  L3-L4 PLIF EXAM: DG C-ARM 61-120 MIN; LUMBAR SPINE - 2-3 VIEW COMPARISON:  Lumbar spine MRI 03/05/2016 FINDINGS: Fluoroscopic images obtained during posterior lumbar interbody fusion surely new interbody prosthesis at the L3-L4 level. Spinal rods are no longer visualized at this level. Fluoroscopy time was reported as 14 seconds. IMPRESSION: Intraoperative fluoroscopy during surgery of the L3-L4 level. Electronically Signed   By: Deatra Robinson M.D.   On: 05/17/2016 14:15   Dg C-arm 1-60 Min  Result Date: 05/17/2016 CLINICAL DATA:  L3-L4 PLIF EXAM: DG C-ARM 61-120 MIN; LUMBAR SPINE - 2-3 VIEW COMPARISON:  Lumbar spine MRI 03/05/2016 FINDINGS: Fluoroscopic images obtained during posterior lumbar interbody fusion surely new interbody prosthesis at the L3-L4 level. Spinal rods are no longer visualized at this level. Fluoroscopy time was  reported as 14 seconds. IMPRESSION: Intraoperative fluoroscopy during surgery of the L3-L4 level. Electronically Signed   By: Deatra Robinson M.D.   On: 05/17/2016 14:15    Assessment/Plan: Postop day #1: The patient is doing well. We will mobilize her with PT. She will likely go home tomorrow.  LOS: 1 day     De Jaworski D 05/18/2016, 7:29 AM

## 2016-05-18 NOTE — Discharge Summary (Signed)
Physician Discharge Summary  Patient ID: Carrie Austin MRN: [id number] DOB/AGE: Jul 31, 1945 71 y.o.  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  The surgery went well.    The patient's postoperative course was unremarkable.  On postoperative day 1. The patient requested discharge to home.  She was given written and oral discharge instructions.  All her questions were answered. The patient is Percocet prescription was sent electronically via my office.  Consults:  Physical therapy Significant Diagnostic Studies:  None Treatments: exploration of lumbar fusion, L2-3 decompression, instrumentation and fusion. Discharge Exam: Blood pressure (!) 120/48, pulse 87, temperature 99 F (37.2 C), temperature source Oral, resp. rate 18, weight 44.5 kg (98 lb), SpO2 99 %.   The patient is alert and pleasant.  She looks well.  Her strength is grossly normal in her lower extremities.  Disposition:   Home  Discharge Instructions    Call MD for:  difficulty breathing, headache or visual disturbances    Complete by:  As directed    Call MD for:  extreme fatigue    Complete by:  As directed    Call MD for:  hives    Complete by:  As directed    Call MD for:  persistant dizziness or light-headedness    Complete by:  As directed    Call MD for:  persistant nausea and vomiting    Complete by:  As directed    Call MD for:  redness, tenderness, or signs of infection (pain, swelling, redness, odor or green/yellow discharge around incision site)    Complete by:  As directed    Call MD for:   severe uncontrolled pain    Complete by:  As directed    Call MD for:  temperature >100.4    Complete by:  As directed    Diet - low sodium heart healthy    Complete by:  As directed    Discharge instructions    Complete by:  As directed    Call [phone] for a followup appointment. Take a stool softener while you are using pain medications.   Driving Restrictions    Complete by:  As directed    Do not drive for 2 weeks.   Increase activity slowly    Complete by:  As directed    Lifting restrictions    Complete by:  As directed    Do not lift more than 5 pounds. No excessive bending or twisting.   May shower / Bathe    Complete by:  As directed    He may shower after the pain she is removed 3 days after surgery. Leave the incision alone.   Remove dressing in 48 hours    Complete by:  As directed    Your stitches are under the scan and will dissolve by themselves. The Steri-Strips will fall off after you take a few showers. Do not rub back or pick at the wound, Leave the wound alone.     Allergies as of 05/18/2016      Reactions   Lisinopril Cough   Deep cough   Amoxicillin Rash   Has patient had a PCN reaction causing  immediate rash, facial/tongue/throat swelling, SOB or lightheadedness with hypotension:unsure Has patient had a PCN reaction causing severe rash involving mucus membranes or skin necrosis:unsure Has patient had a PCN reaction that required hospitalization:NO  . Has patient had a PCN reaction occurring within the last 10 years:    Yes If all of the above answers are "NO", then may proceed with Cephalosporin use.   Cephalosporins Other (See Comments)   Flushing of the face      Medication List    STOP taking these medications   acetaminophen 500 MG tablet Commonly known as:  TYLENOL   HYDROcodone-acetaminophen 5-325 MG tablet Commonly known as:  NORCO/VICODIN     TAKE these medications   ALPRAZolam 0.5 MG tablet Commonly known as:  XANAX Take 0.5 mg by  mouth 2 (two) times daily.   amitriptyline 25 MG tablet Commonly known as:  ELAVIL Take 75 mg by mouth at bedtime. (SCHEDULED EVERY NIGHT--PATIENT WILL NOT SLEEP W/O THIS MEDICATION)   atorvastatin 20 MG tablet Commonly known as:  LIPITOR Take 20 mg by mouth every Wednesday.   CALCIUM-VITAMIN D-VITAMIN K PO Take 1 tablet by mouth daily with lunch.   clobetasol ointment 0.05 % Commonly known as:  TEMOVATE Apply 1 application topically 2 (two) times daily as needed (for rash).   COD LIVER OIL PO Take 5 mLs by mouth daily.   cyclobenzaprine 10 MG tablet Commonly known as:  FLEXERIL Take 1 tablet (10 mg total) by mouth 3 (three) times daily as needed for muscle spasms.   escitalopram 20 MG tablet Commonly known as:  LEXAPRO Take 20 mg by mouth daily.   gabapentin 100 MG capsule Commonly known as:  NEURONTIN Take 100 mg by mouth 2 (two) times daily.   HAIR SKIN & NAILS GUMMIES PO Take 2 tablets by mouth daily.   hydrocortisone 2.5 % cream Apply 1 application topically 2 (two) times daily as needed (for skin irritation/rash).   losartan 100 MG tablet Commonly known as:  COZAAR Take 100 mg by mouth daily.   Magnesium 250 MG Tabs Take 250 mg by mouth daily.   omeprazole 20 MG tablet Commonly known as:  PRILOSEC OTC Take 20 mg by mouth daily before breakfast.   RA COL-RITE 100 MG capsule Generic drug:  docusate sodium Take 100 mg by mouth 2 (two) times daily.   SOOTHE NIGHT TIME Oint Place 1 application into both eyes at bedtime.   SYSTANE BALANCE 0.6 % Soln Generic drug:  Propylene Glycol Place 1 drop into both eyes 4 (four) times daily as needed (for dry/irritated eyes (scheduled EVERY MORNING)).   Vitamin D 2000 units Caps Take 2,000 Units by mouth daily.        SignedTressie Austin D 05/18/2016, 11:26 AM

## 2016-05-18 NOTE — Discharge Instructions (Signed)

## 2016-09-27 DIAGNOSIS — M069 Rheumatoid arthritis, unspecified: Secondary | ICD-10-CM | POA: Insufficient documentation

## 2016-10-11 ENCOUNTER — Encounter (INDEPENDENT_AMBULATORY_CARE_PROVIDER_SITE_OTHER): Payer: Self-pay

## 2016-10-11 ENCOUNTER — Encounter (HOSPITAL_COMMUNITY): Payer: Self-pay | Admitting: Psychiatry

## 2016-10-11 ENCOUNTER — Ambulatory Visit (INDEPENDENT_AMBULATORY_CARE_PROVIDER_SITE_OTHER): Payer: Medicare HMO | Admitting: Psychiatry

## 2016-10-11 VITALS — BP 122/70 | HR 68 | Ht 59.0 in | Wt 96.8 lb

## 2016-10-11 DIAGNOSIS — R450 Nervousness: Secondary | ICD-10-CM | POA: Diagnosis not present

## 2016-10-11 DIAGNOSIS — R4584 Anhedonia: Secondary | ICD-10-CM

## 2016-10-11 DIAGNOSIS — R5383 Other fatigue: Secondary | ICD-10-CM

## 2016-10-11 DIAGNOSIS — G4700 Insomnia, unspecified: Secondary | ICD-10-CM | POA: Diagnosis not present

## 2016-10-11 DIAGNOSIS — F419 Anxiety disorder, unspecified: Secondary | ICD-10-CM | POA: Diagnosis not present

## 2016-10-11 DIAGNOSIS — F4312 Post-traumatic stress disorder, chronic: Secondary | ICD-10-CM | POA: Diagnosis not present

## 2016-10-11 DIAGNOSIS — Z62810 Personal history of physical and sexual abuse in childhood: Secondary | ICD-10-CM | POA: Diagnosis not present

## 2016-10-11 DIAGNOSIS — Z91411 Personal history of adult psychological abuse: Secondary | ICD-10-CM | POA: Diagnosis not present

## 2016-10-11 DIAGNOSIS — F332 Major depressive disorder, recurrent severe without psychotic features: Secondary | ICD-10-CM | POA: Diagnosis not present

## 2016-10-11 DIAGNOSIS — F5101 Primary insomnia: Secondary | ICD-10-CM | POA: Diagnosis not present

## 2016-10-11 DIAGNOSIS — R454 Irritability and anger: Secondary | ICD-10-CM

## 2016-10-11 DIAGNOSIS — K219 Gastro-esophageal reflux disease without esophagitis: Secondary | ICD-10-CM | POA: Insufficient documentation

## 2016-10-11 MED ORDER — ESCITALOPRAM OXALATE 20 MG PO TABS: 20.0000 mg | ORAL_TABLET | Freq: Every day | ORAL | 1 refills | Status: DC

## 2016-10-11 MED ORDER — ALPRAZOLAM 0.5 MG PO TABS: 0.5000 mg | ORAL_TABLET | Freq: Two times a day (BID) | ORAL | 2 refills | Status: DC

## 2016-10-11 MED ORDER — AMITRIPTYLINE HCL 75 MG PO TABS: 75.0000 mg | ORAL_TABLET | Freq: Every day | ORAL | 1 refills | Status: DC
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
## 2016-10-14 ENCOUNTER — Telehealth (HOSPITAL_COMMUNITY): Payer: Self-pay

## 2016-10-14 NOTE — Telephone Encounter (Signed)
Patient is calling because the 75 mg Amitriptyline is too much for her to pay. She would like to know if you can change the prescription to 25 mg 3 tabs at bedtime, she said this way her copay will be 0.

## 2016-10-15 ENCOUNTER — Other Ambulatory Visit (HOSPITAL_COMMUNITY): Payer: Self-pay | Admitting: Psychiatry

## 2016-10-15 DIAGNOSIS — F332 Major depressive disorder, recurrent severe without psychotic features: Secondary | ICD-10-CM

## 2016-10-15 MED ORDER — AMITRIPTYLINE HCL 25 MG PO TABS: 75.0000 mg | ORAL_TABLET | Freq: Every day | ORAL | 1 refills | Status: DC

## 2016-10-15 NOTE — Telephone Encounter (Signed)
All set, I sent the change over to her mail pharmacy

## 2016-10-21 ENCOUNTER — Other Ambulatory Visit (HOSPITAL_COMMUNITY): Payer: Self-pay | Admitting: Psychiatry

## 2016-10-21 ENCOUNTER — Encounter (HOSPITAL_COMMUNITY): Payer: Self-pay | Admitting: Psychiatry

## 2016-10-21 ENCOUNTER — Other Ambulatory Visit (HOSPITAL_COMMUNITY): Payer: Self-pay

## 2016-10-21 DIAGNOSIS — F332 Major depressive disorder, recurrent severe without psychotic features: Secondary | ICD-10-CM

## 2016-10-21 MED ORDER — AMITRIPTYLINE HCL 25 MG PO TABS: 75.0000 mg | ORAL_TABLET | Freq: Every day | ORAL | 1 refills | Status: DC

## 2016-10-22 ENCOUNTER — Ambulatory Visit (HOSPITAL_COMMUNITY): Payer: Self-pay | Admitting: Psychiatry

## 2016-12-22 ENCOUNTER — Ambulatory Visit (HOSPITAL_COMMUNITY): Payer: Medicare HMO | Admitting: Psychiatry

## 2016-12-22 ENCOUNTER — Encounter (HOSPITAL_COMMUNITY): Payer: Self-pay | Admitting: Psychiatry

## 2016-12-22 VITALS — BP 118/64 | HR 75 | Ht 60.0 in | Wt 98.0 lb

## 2016-12-22 DIAGNOSIS — F332 Major depressive disorder, recurrent severe without psychotic features: Secondary | ICD-10-CM

## 2016-12-22 DIAGNOSIS — F4312 Post-traumatic stress disorder, chronic: Secondary | ICD-10-CM | POA: Diagnosis not present

## 2016-12-22 DIAGNOSIS — F5101 Primary insomnia: Secondary | ICD-10-CM
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
## 2016-12-22 NOTE — Patient Instructions (Signed)
[email or web address]

## 2017-01-24 ENCOUNTER — Encounter (HOSPITAL_COMMUNITY): Payer: Self-pay | Admitting: Psychiatry

## 2017-01-24 ENCOUNTER — Ambulatory Visit (HOSPITAL_COMMUNITY): Payer: Medicare HMO | Admitting: Psychiatry

## 2017-01-24 VITALS — BP 118/64 | HR 78 | Ht 60.0 in | Wt 97.0 lb

## 2017-01-24 DIAGNOSIS — F332 Major depressive disorder, recurrent severe without psychotic features: Secondary | ICD-10-CM | POA: Diagnosis not present

## 2017-01-24 DIAGNOSIS — F4312 Post-traumatic stress disorder, chronic: Secondary | ICD-10-CM | POA: Diagnosis not present

## 2017-01-24 NOTE — Progress Notes (Signed)
BH MD/PA/NP OP Progress Note  01/24/2017 4:17 PM Carrie Austin  MRN:  [id number]  Chief Complaint: Doing a little better HPI: Patient and I discussed and processed our last conversation and some of her grief related to relationships.  Spent time discussing and encouraging individual therapy and she is open to this.  She wishes to hold off on TMS for the time being.  We will follow-up with this writer in 3-4 months and reconsider TMS for the future.  Spent time with the patient discussing her ideas of her sense of self and idealization of the men in her life.  Spent time discussing what she would do with her life if she knew that she had 1 month left to live, and how she would live differently in her relationships and in her treatment of herself.  She denies any suicidal thoughts or significant depressive symptoms, continues to struggle with anxiety and grief.  Reports that her medications are okay at the current doses.  Visit Diagnosis:    ICD-10-CM   1. Severe episode of recurrent major depressive disorder, without psychotic features (HCC) F33.2 Ambulatory referral to Psychology  2. Chronic post-traumatic stress disorder (PTSD) F43.12 Ambulatory referral to Psychology    Past Psychiatric History: See intake H&P for full details. Reviewed, with no updates at this time.   Past Medical History:  Past Medical History:  Diagnosis Date  . Allergy   . Anxiety   . Arthritis   . Cough    due allergies chronic  . Depression   . Dilatation of esophagus   . Fibromyalgia   . GERD (gastroesophageal reflux disease)   . Headache(784.0)   . Heart murmur    one time  . Hypertension   . PONV (postoperative nausea and vomiting)    hx of Vertigo after anesthesia    Past Surgical History:  Procedure Laterality Date  . ABDOMINAL HYSTERECTOMY    . BACK SURGERY  12/21/12  . CHOLECYSTECTOMY    . COLONOSCOPY    . EYE SURGERY Bilateral    lasik    Family Psychiatric History: See intake H&P for  full details. Reviewed, with no updates at this time.   Family History: No family history on file.  Social History:  Social History   Socioeconomic History  . Marital status: Widowed    Spouse name: None  . Number of children: None  . Years of education: None  . Highest education level: None  Social Needs  . Financial resource strain: None  . Food insecurity - worry: None  . Food insecurity - inability: None  . Transportation needs - medical: None  . Transportation needs - non-medical: None  Occupational History  . None  Tobacco Use  . Smoking status: Never Smoker  . Smokeless tobacco: Never Used  Substance and Sexual Activity  . Alcohol use: No    Comment: social  . Drug use: No  . Sexual activity: No  Other Topics Concern  . None  Social History Narrative  . None    Allergies:  Allergies  Allergen Reactions  . Lisinopril Cough    Deep cough  . Amoxicillin Rash    Has patient had a PCN reaction causing immediate rash, facial/tongue/throat swelling, SOB or lightheadedness with hypotension:unsure Has patient had a PCN reaction causing severe rash involving mucus membranes or skin necrosis:unsure Has patient had a PCN reaction that required hospitalization:NO  . Has patient had a PCN reaction occurring within the last 10 years:  Yes If all of the above answers are "NO", then may proceed with Cephalosporin use.   . Cephalosporins Other (See Comments)    Flushing of the face    Metabolic Disorder Labs: No results found for: HGBA1C, MPG No results found for: PROLACTIN No results found for: CHOL, TRIG, HDL, CHOLHDL, VLDL, LDLCALC No results found for: TSH  Therapeutic Level Labs: No results found for: LITHIUM No results found for: VALPROATE No components found for:  CBMZ  Current Medications: Current Outpatient Medications  Medication Sig Dispense Refill  . ALPRAZolam (XANAX) 0.5 MG tablet Take 1 tablet (0.5 mg total) by mouth 2 (two) times daily. 60  tablet 2  . amitriptyline (ELAVIL) 25 MG tablet Take 3 tablets (75 mg total) by mouth at bedtime. 270 tablet 1  . Artificial Tear Ointment (SOOTHE NIGHT TIME) OINT Place 1 application into both eyes at bedtime.    Marland Kitchen atorvastatin (LIPITOR) 20 MG tablet Take 20 mg by mouth every Wednesday.     . Biotin w/ Vitamins C & E (HAIR SKIN & NAILS GUMMIES PO) Take 2 tablets by mouth daily.    Marland Kitchen CALCIUM-VITAMIN D-VITAMIN K PO Take 1 tablet by mouth daily with lunch.    . Cholecalciferol (VITAMIN D) 2000 UNITS CAPS Take 2,000 Units by mouth daily.    . clobetasol ointment (TEMOVATE) 0.05 % Apply 1 application topically 2 (two) times daily as needed (for rash).    . COD LIVER OIL PO Take 5 mLs by mouth daily.    Marland Kitchen docusate sodium (RA COL-RITE) 100 MG capsule Take 100 mg by mouth 2 (two) times daily.    Marland Kitchen escitalopram (LEXAPRO) 20 MG tablet Take 1 tablet (20 mg total) by mouth daily. 90 tablet 1  . hydrocortisone 2.5 % cream Apply 1 application topically 2 (two) times daily as needed (for skin irritation/rash).     Marland Kitchen losartan (COZAAR) 100 MG tablet Take 100 mg by mouth daily.    . Magnesium 250 MG TABS Take 250 mg by mouth daily.    Marland Kitchen omeprazole (PRILOSEC OTC) 20 MG tablet Take 20 mg by mouth daily before breakfast.    . Propylene Glycol (SYSTANE BALANCE) 0.6 % SOLN Place 1 drop into both eyes 4 (four) times daily as needed (for dry/irritated eyes (scheduled EVERY MORNING)).    Marland Kitchen gabapentin (NEURONTIN) 100 MG capsule Take 100 mg by mouth 2 (two) times daily.     No current facility-administered medications for this visit.      Musculoskeletal: Strength & Muscle Tone: within normal limits Gait & Station: normal Patient leans: N/A  Psychiatric Specialty Exam: ROS  Blood pressure 118/64, pulse 78, height 5' (1.524 m), weight 97 lb (44 kg), SpO2 99 %.Body mass index is 18.94 kg/m.  General Appearance: Bizarre and Casual  Eye Contact:  Good  Speech:  Clear and Coherent  Volume:  Normal  Mood:   Euthymic  Affect:  Appropriate and Congruent  Thought Process:  Goal Directed and Descriptions of Associations: Intact  Orientation:  Full (Time, Place, and Person)  Thought Content: Logical   Suicidal Thoughts:  No  Homicidal Thoughts:  No  Memory:  Immediate;   Fair  Judgement:  Fair  Insight:  Fair and Present  Psychomotor Activity:  Normal  Concentration:  Concentration: Fair  Recall:  [employer] of Knowledge: Fair  Language: Fair  Akathisia:  Negative  Handed:  Right  AIMS (if indicated): not done  Assets:  Communication Skills Desire for Improvement Financial  Resources/Insurance Housing Intimacy  ADL's:  Intact  Cognition: WNL  Sleep:  Good   Screenings:   Assessment and Plan:  SHIFFY SMIGIELSKI presents with a fairly stable mood, we continue to consider whether TMS would be a helpful intervention for her, but this is limited by her financial concerns.  She continues on Lexapro and Elavil with periodic use of Xanax.  Much of what continues to strain her mood and anxiety appear to be related to complex grief and I believe she would benefit from participation in consistent individual therapy, which she is agreeable to.  No acute safety issues, we will follow-up in 10-12 weeks.  1. Severe episode of recurrent major depressive disorder, without psychotic features (HCC)   2. Chronic post-traumatic stress disorder (PTSD)     Status of current problems: stable  Labs Ordered: Orders Placed This Encounter  Procedures  . Ambulatory referral to Psychology    Referral Priority:   Routine    Referral Type:   Psychiatric    Referral Reason:   Specialty Services Required    Requested Specialty:   Psychology    Number of Visits Requested:   1    Labs Reviewed: n/a  Collateral Obtained/Records Reviewed: n/a  Plan:  Continue Lexapro 20 mg daily, and Elavil 75 mg nightly Minimize use of Xanax, 0.5 mg twice a day as needed Continue to consider TMS  I spent 30 minutes with  the patient in direct face-to-face clinical care.  Greater than 50% of this time was spent in counseling and coordination of care with the patient.    Burnard Leigh, MD 01/24/2017, 4:17 PM

## 2017-02-24 ENCOUNTER — Ambulatory Visit: Payer: Medicare HMO | Admitting: Psychology

## 2017-03-10 ENCOUNTER — Ambulatory Visit: Payer: Medicare HMO | Admitting: Psychology

## 2017-03-10 DIAGNOSIS — F331 Major depressive disorder, recurrent, moderate: Secondary | ICD-10-CM | POA: Diagnosis not present

## 2017-03-28 ENCOUNTER — Ambulatory Visit (HOSPITAL_COMMUNITY): Payer: Self-pay | Admitting: Psychiatry

## 2017-04-05 ENCOUNTER — Encounter (HOSPITAL_COMMUNITY): Payer: Self-pay | Admitting: Psychiatry

## 2017-04-05 ENCOUNTER — Ambulatory Visit (HOSPITAL_COMMUNITY): Payer: Medicare HMO | Admitting: Psychiatry

## 2017-04-05 DIAGNOSIS — F5101 Primary insomnia: Secondary | ICD-10-CM

## 2017-04-05 DIAGNOSIS — F332 Major depressive disorder, recurrent severe without psychotic features: Secondary | ICD-10-CM

## 2017-04-05 DIAGNOSIS — F4312 Post-traumatic stress disorder, chronic: Secondary | ICD-10-CM | POA: Diagnosis not present

## 2017-04-05 MED ORDER — ESCITALOPRAM OXALATE 20 MG PO TABS: 20.0000 mg | ORAL_TABLET | Freq: Every day | ORAL | 1 refills | Status: DC

## 2017-04-05 MED ORDER — ALPRAZOLAM 0.5 MG PO TABS: 0.5000 mg | ORAL_TABLET | Freq: Two times a day (BID) | ORAL | 2 refills | Status: DC

## 2017-04-05 MED ORDER — AMITRIPTYLINE HCL 25 MG PO TABS: 75.0000 mg | ORAL_TABLET | Freq: Every day | ORAL | 1 refills | Status: DC
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
## 2017-04-11 ENCOUNTER — Ambulatory Visit: Payer: Medicare HMO | Admitting: Psychology

## 2017-05-16 ENCOUNTER — Ambulatory Visit: Payer: Medicare HMO | Admitting: Psychology

## 2017-05-23 ENCOUNTER — Other Ambulatory Visit (HOSPITAL_COMMUNITY): Payer: Self-pay | Admitting: Psychiatry

## 2017-05-23 DIAGNOSIS — F332 Major depressive disorder, recurrent severe without psychotic features: Secondary | ICD-10-CM

## 2017-06-01 ENCOUNTER — Encounter (HOSPITAL_COMMUNITY): Payer: Self-pay | Admitting: Psychiatry

## 2017-06-01 ENCOUNTER — Ambulatory Visit (HOSPITAL_COMMUNITY): Payer: Medicare HMO | Admitting: Psychiatry

## 2017-06-01 VITALS — BP 118/70 | HR 78 | Ht 60.0 in | Wt 97.0 lb

## 2017-06-01 DIAGNOSIS — F332 Major depressive disorder, recurrent severe without psychotic features: Secondary | ICD-10-CM | POA: Diagnosis not present

## 2017-06-01 DIAGNOSIS — F4312 Post-traumatic stress disorder, chronic: Secondary | ICD-10-CM | POA: Diagnosis not present

## 2017-06-01 MED ORDER — ESCITALOPRAM OXALATE 20 MG PO TABS: 20.0000 mg | ORAL_TABLET | Freq: Every day | ORAL | 1 refills | Status: DC

## 2017-06-01 MED ORDER — ALPRAZOLAM 0.5 MG PO TABS: 0.5000 mg | ORAL_TABLET | Freq: Two times a day (BID) | ORAL | 2 refills | Status: DC

## 2017-06-01 NOTE — Progress Notes (Signed)
BH MD/PA/NP OP Progress Note  06/01/2017 4:08 PM Carrie Austin  MRN:  [id number]  Chief Complaint: med management  HPI: Carrie Austin presents for follow-up medication management.  She attended one therapy visit, but reports she does not want to do therapy.  She has resigned herself to living with depression, denies any thoughts to harm herself.  I spent time with her pointing out that she does seem to want to discuss her feelings and emotions and stressors with this Clinical research associate, and she acknowledges that she does seek out some element of therapy, but has difficulties in comfort and establishing relationships and report.  I suggested she consider group therapy which she was open to.  We continue the medication regimen as below, I offered some augmenting strategies but she is apprehensive about any medication changes.  Visit Diagnosis:    ICD-10-CM   1. Chronic post-traumatic stress disorder (PTSD) F43.12   2. Severe episode of recurrent major depressive disorder, without psychotic features (HCC) F33.2 ALPRAZolam (XANAX) 0.5 MG tablet    escitalopram (LEXAPRO) 20 MG tablet    Past Psychiatric History: See intake H&P for full details. Reviewed, with no updates at this time.   Past Medical History:  Past Medical History:  Diagnosis Date  . Allergy   . Anxiety   . Arthritis   . Cough    due allergies chronic  . Depression   . Dilatation of esophagus   . Fibromyalgia   . GERD (gastroesophageal reflux disease)   . Headache(784.0)   . Heart murmur    one time  . Hypertension   . PONV (postoperative nausea and vomiting)    hx of Vertigo after anesthesia    Past Surgical History:  Procedure Laterality Date  . ABDOMINAL HYSTERECTOMY    . BACK SURGERY  12/21/12  . CHOLECYSTECTOMY    . COLONOSCOPY    . EYE SURGERY Bilateral    lasik    Family Psychiatric History: See intake H&P for full details. Reviewed, with no updates at this time.   Family History: No family history on  file.  Social History:  Social History   Socioeconomic History  . Marital status: Widowed    Spouse name: Not on file  . Number of children: Not on file  . Years of education: Not on file  . Highest education level: Not on file  Occupational History  . Not on file  Social Needs  . Financial resource strain: Not on file  . Food insecurity:    Worry: Not on file    Inability: Not on file  . Transportation needs:    Medical: Not on file    Non-medical: Not on file  Tobacco Use  . Smoking status: Never Smoker  . Smokeless tobacco: Never Used  Substance and Sexual Activity  . Alcohol use: No    Comment: social  . Drug use: No  . Sexual activity: Never  Lifestyle  . Physical activity:    Days per week: Not on file    Minutes per session: Not on file  . Stress: Not on file  Relationships  . Social connections:    Talks on phone: Not on file    Gets together: Not on file    Attends religious service: Not on file    Active member of club or organization: Not on file    Attends meetings of clubs or organizations: Not on file    Relationship status: Not on file  Other Topics Concern  .  Not on file  Social History Narrative  . Not on file    Allergies:  Allergies  Allergen Reactions  . Lisinopril Cough    Deep cough  . Amoxicillin Rash    Has patient had a PCN reaction causing immediate rash, facial/tongue/throat swelling, SOB or lightheadedness with hypotension:unsure Has patient had a PCN reaction causing severe rash involving mucus membranes or skin necrosis:unsure Has patient had a PCN reaction that required hospitalization:NO  . Has patient had a PCN reaction occurring within the last 10 years:    Yes If all of the above answers are "NO", then may proceed with Cephalosporin use.   . Cephalosporins Other (See Comments)    Flushing of the face    Metabolic Disorder Labs: No results found for: HGBA1C, MPG No results found for: PROLACTIN No results found for:  CHOL, TRIG, HDL, CHOLHDL, VLDL, LDLCALC No results found for: TSH  Therapeutic Level Labs: No results found for: LITHIUM No results found for: VALPROATE No components found for:  CBMZ  Current Medications: Current Outpatient Medications  Medication Sig Dispense Refill  . ALPRAZolam (XANAX) 0.5 MG tablet Take 1 tablet (0.5 mg total) by mouth 2 (two) times daily. 60 tablet 2  . amitriptyline (ELAVIL) 25 MG tablet TAKE 3 TABLETS BY MOUTH AT BEDTIME 270 tablet 1  . Artificial Tear Ointment (SOOTHE NIGHT TIME) OINT Place 1 application into both eyes at bedtime.    Marland Kitchen atorvastatin (LIPITOR) 20 MG tablet Take 20 mg by mouth every Wednesday.     . Biotin w/ Vitamins C & E (HAIR SKIN & NAILS GUMMIES PO) Take 2 tablets by mouth daily.    Marland Kitchen CALCIUM-VITAMIN D-VITAMIN K PO Take 1 tablet by mouth daily with lunch.    . Cholecalciferol (VITAMIN D) 2000 UNITS CAPS Take 2,000 Units by mouth daily.    . clobetasol ointment (TEMOVATE) 0.05 % Apply 1 application topically 2 (two) times daily as needed (for rash).    . COD LIVER OIL PO Take 5 mLs by mouth daily.    Marland Kitchen docusate sodium (RA COL-RITE) 100 MG capsule Take 100 mg by mouth 2 (two) times daily.    Marland Kitchen escitalopram (LEXAPRO) 20 MG tablet Take 1 tablet (20 mg total) by mouth daily. 90 tablet 1  . folic acid (FOLVITE) 1 MG tablet Take 1 mg by mouth daily.    Marland Kitchen gabapentin (NEURONTIN) 100 MG capsule Take 100 mg by mouth 2 (two) times daily.    . hydrocortisone 2.5 % cream Apply 1 application topically 2 (two) times daily as needed (for skin irritation/rash).     Marland Kitchen losartan (COZAAR) 100 MG tablet Take 100 mg by mouth daily.    . Magnesium 250 MG TABS Take 250 mg by mouth daily.    . methotrexate (RHEUMATREX) 2.5 MG tablet Take by mouth.    Marland Kitchen omeprazole (PRILOSEC OTC) 20 MG tablet Take 20 mg by mouth daily before breakfast.    . Propylene Glycol (SYSTANE BALANCE) 0.6 % SOLN Place 1 drop into both eyes 4 (four) times daily as needed (for dry/irritated eyes  (scheduled EVERY MORNING)).     No current facility-administered medications for this visit.      Musculoskeletal: Strength & Muscle Tone: within normal limits Gait & Station: normal Patient leans: N/A  Psychiatric Specialty Exam: ROS  Blood pressure 118/70, pulse 78, height 5' (1.524 m), weight 97 lb (44 kg), SpO2 98 %.Body mass index is 18.94 kg/m.  General Appearance: Casual and Fairly Groomed  Eye Contact:  Fair  Speech:  Clear and Coherent and Normal Rate  Volume:  Normal  Mood:  Anxious and Dysphoric  Affect:  Appropriate and Congruent  Thought Process:  Goal Directed and Descriptions of Associations: Intact  Orientation:  Full (Time, Place, and Person)  Thought Content: Logical   Suicidal Thoughts:  No  Homicidal Thoughts:  No  Memory:  Immediate;   Fair  Judgement:  Intact  Insight:  Shallow  Psychomotor Activity:  Normal  Concentration:  Attention Span: Fair  Recall:  [employer] of Knowledge: Fair  Language: Good  Akathisia:  Negative  Handed:  Right  AIMS (if indicated): not done  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  ADL's:  Intact  Cognition: WNL  Sleep:  Good   Screenings:   Assessment and Plan:  Carrie Austin is a 72 year old female with depression and cluster B personality, help seeking and help avoidance, poor participation in care, presenting for medication management follow-up.  She is an incredibly traumatic childhood and adulthood, and understandably avoids unpacking some of these severely painful memories.  Unfortunately, her mood and anxiety symptoms have remained fairly stuck because of her apprehension to participate in therapy.  I am hopeful she may glean some benefit from group therapy and she is agreeable to do this.  We will continue her current medication regimen below and follow-up in 2-3 months, transfer her care to Dr. Donell Beers thereafter as Clinical research associate is departing  from clinic.  1. Chronic post-traumatic stress disorder (PTSD)   2. Severe episode of recurrent major depressive disorder, without psychotic features (HCC)     Status of current problems: unchanged  Labs Ordered: No orders of the defined types were placed in this encounter.   Labs Reviewed: NA  Collateral Obtained/Records Reviewed: NA  Plan:  Continue Xanax, Lexapro, Elavil as prescribed RTC 3 MONTHS Group therapy referral  I spent 20 minutes with the patient in direct face-to-face clinical care.  Greater than 50% of this time was spent in counseling and coordination of care with the patient.    Burnard Leigh, MD 06/01/2017, 4:08 PM

## 2017-07-27 ENCOUNTER — Encounter (HOSPITAL_COMMUNITY): Payer: Self-pay | Admitting: Psychiatry

## 2017-07-27 ENCOUNTER — Ambulatory Visit (HOSPITAL_COMMUNITY): Payer: Medicare HMO | Admitting: Psychiatry

## 2017-07-27 DIAGNOSIS — F332 Major depressive disorder, recurrent severe without psychotic features: Secondary | ICD-10-CM

## 2017-07-27 MED ORDER — ALPRAZOLAM 0.5 MG PO TABS: 0.5000 mg | ORAL_TABLET | Freq: Two times a day (BID) | ORAL | 2 refills | Status: DC

## 2017-07-27 NOTE — Progress Notes (Signed)
BH MD/PA/NP OP Progress Note  07/27/2017 3:02 PM Carrie Austin  MRN:  [id number]  Chief Complaint: med management  HPI: Carrie Austin presents for follow-up medication management.  She reports that overall she is doing well and she is going to be taking a trip to [place] with a friend.  Reports that she has done a better job setting boundaries with her boyfriend, and she is focusing on trying to take care of herself.  Not exactly clear why this change has come about, but she feels pretty good about things.  Sleeping well at night.  Pain is about the same.  No acute safety concerns and she understands that she has follow-up transfer of care scheduled with Dr. Donell Beers, and is welcome to reach out to writer if she would like to come to the new practice in the future.  Visit Diagnosis:    ICD-10-CM   1. Severe episode of recurrent major depressive disorder, without psychotic features (HCC) F33.2 ALPRAZolam (XANAX) 0.5 MG tablet    Past Psychiatric History: See intake H&P for full details. Reviewed, with no updates at this time.   Past Medical History:  Past Medical History:  Diagnosis Date  . Allergy   . Anxiety   . Arthritis   . Cough    due allergies chronic  . Depression   . Dilatation of esophagus   . Fibromyalgia   . GERD (gastroesophageal reflux disease)   . Headache(784.0)   . Heart murmur    one time  . Hypertension   . PONV (postoperative nausea and vomiting)    hx of Vertigo after anesthesia    Past Surgical History:  Procedure Laterality Date  . ABDOMINAL HYSTERECTOMY    . BACK SURGERY  12/21/12  . CHOLECYSTECTOMY    . COLONOSCOPY    . EYE SURGERY Bilateral    lasik    Family Psychiatric History: See intake H&P for full details. Reviewed, with no updates at this time.   Family History: No family history on file.  Social History:  Social History   Socioeconomic History  . Marital status: Widowed    Spouse name: Not on file  . Number of children:  Not on file  . Years of education: Not on file  . Highest education level: Not on file  Occupational History  . Not on file  Social Needs  . Financial resource strain: Not on file  . Food insecurity:    Worry: Not on file    Inability: Not on file  . Transportation needs:    Medical: Not on file    Non-medical: Not on file  Tobacco Use  . Smoking status: Never Smoker  . Smokeless tobacco: Never Used  Substance and Sexual Activity  . Alcohol use: No    Comment: social  . Drug use: No  . Sexual activity: Never  Lifestyle  . Physical activity:    Days per week: Not on file    Minutes per session: Not on file  . Stress: Not on file  Relationships  . Social connections:    Talks on phone: Not on file    Gets together: Not on file    Attends religious service: Not on file    Active member of club or organization: Not on file    Attends meetings of clubs or organizations: Not on file    Relationship status: Not on file  Other Topics Concern  . Not on file  Social History Narrative  .  Not on file    Allergies:  Allergies  Allergen Reactions  . Lisinopril Cough    Deep cough  . Amoxicillin Rash    Has patient had a PCN reaction causing immediate rash, facial/tongue/throat swelling, SOB or lightheadedness with hypotension:unsure Has patient had a PCN reaction causing severe rash involving mucus membranes or skin necrosis:unsure Has patient had a PCN reaction that required hospitalization:NO  . Has patient had a PCN reaction occurring within the last 10 years:    Yes If all of the above answers are "NO", then may proceed with Cephalosporin use.   . Cephalosporins Other (See Comments)    Flushing of the face    Metabolic Disorder Labs: No results found for: HGBA1C, MPG No results found for: PROLACTIN No results found for: CHOL, TRIG, HDL, CHOLHDL, VLDL, LDLCALC No results found for: TSH  Therapeutic Level Labs: No results found for: LITHIUM No results found for:  VALPROATE No components found for:  CBMZ  Current Medications: Current Outpatient Medications  Medication Sig Dispense Refill  . ALPRAZolam (XANAX) 0.5 MG tablet Take 1 tablet (0.5 mg total) by mouth 2 (two) times daily. 60 tablet 2  . amitriptyline (ELAVIL) 25 MG tablet TAKE 3 TABLETS BY MOUTH AT BEDTIME 270 tablet 1  . Artificial Tear Ointment (SOOTHE NIGHT TIME) OINT Place 1 application into both eyes at bedtime.    Marland Kitchen atorvastatin (LIPITOR) 20 MG tablet Take 20 mg by mouth every Wednesday.     . Biotin w/ Vitamins C & E (HAIR SKIN & NAILS GUMMIES PO) Take 2 tablets by mouth daily.    Marland Kitchen CALCIUM-VITAMIN D-VITAMIN K PO Take 1 tablet by mouth daily with lunch.    . Cholecalciferol (VITAMIN D) 2000 UNITS CAPS Take 2,000 Units by mouth daily.    . clobetasol ointment (TEMOVATE) 0.05 % Apply 1 application topically 2 (two) times daily as needed (for rash).    . COD LIVER OIL PO Take 5 mLs by mouth daily.    Marland Kitchen docusate sodium (RA COL-RITE) 100 MG capsule Take 100 mg by mouth 2 (two) times daily.    Marland Kitchen escitalopram (LEXAPRO) 20 MG tablet Take 1 tablet (20 mg total) by mouth daily. 90 tablet 1  . folic acid (FOLVITE) 1 MG tablet Take 1 mg by mouth daily.    . hydrocortisone 2.5 % cream Apply 1 application topically 2 (two) times daily as needed (for skin irritation/rash).     Marland Kitchen losartan (COZAAR) 100 MG tablet Take 100 mg by mouth daily.    . Magnesium 250 MG TABS Take 250 mg by mouth daily.    . methotrexate (RHEUMATREX) 2.5 MG tablet Take by mouth.    Marland Kitchen omeprazole (PRILOSEC OTC) 20 MG tablet Take 20 mg by mouth daily before breakfast.    . Propylene Glycol (SYSTANE BALANCE) 0.6 % SOLN Place 1 drop into both eyes 4 (four) times daily as needed (for dry/irritated eyes (scheduled EVERY MORNING)).     No current facility-administered medications for this visit.      Musculoskeletal: Strength & Muscle Tone: within normal limits Gait & Station: normal Patient leans: N/A  Psychiatric Specialty  Exam: ROS  There were no vitals taken for this visit.There is no height or weight on file to calculate BMI.  General Appearance: Casual and Fairly Groomed  Eye Contact:  Fair  Speech:  Clear and Coherent and Normal Rate  Volume:  Normal  Mood:  Anxious and Dysphoric  Affect:  Appropriate and Congruent  Thought  Process:  Goal Directed and Descriptions of Associations: Intact  Orientation:  Full (Time, Place, and Person)  Thought Content: Logical   Suicidal Thoughts:  No  Homicidal Thoughts:  No  Memory:  Immediate;   Fair  Judgement:  Intact  Insight:  Shallow  Psychomotor Activity:  Normal  Concentration:  Attention Span: Fair  Recall:  [employer] of Knowledge: Fair  Language: Good  Akathisia:  Negative  Handed:  Right  AIMS (if indicated): not done  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  ADL's:  Intact  Cognition: WNL  Sleep:  Good   Screenings:   Assessment and Plan:  Carrie Austin presents with generally stable mood and anxiety, doing a better job setting some boundaries in her personal relationships.  No acute safety concerns and she is going on a trip to [place] with a friend of hers.  We will follow-up with Dr. Donell Beers in 3 months or sooner if needed.  1. Severe episode of recurrent major depressive disorder, without psychotic features (HCC)     Status of current problems: unchanged  Labs Ordered: No orders of the defined types were placed in this encounter.   Labs Reviewed: NA  Collateral Obtained/Records Reviewed: NA  Plan:  Continue Xanax, Lexapro, Elavil as prescribed RTC 3 MONTHS   Burnard Leigh, MD 07/27/2017, 3:02 PM

## 2017-10-05 ENCOUNTER — Encounter (HOSPITAL_COMMUNITY): Payer: Self-pay | Admitting: Psychiatry

## 2017-10-05 ENCOUNTER — Ambulatory Visit (HOSPITAL_COMMUNITY): Payer: Medicare HMO | Admitting: Psychiatry

## 2017-10-05 VITALS — BP 132/70 | HR 76 | Ht 60.0 in | Wt 95.0 lb

## 2017-10-05 DIAGNOSIS — F332 Major depressive disorder, recurrent severe without psychotic features: Secondary | ICD-10-CM | POA: Diagnosis not present

## 2017-10-05 DIAGNOSIS — F3342 Major depressive disorder, recurrent, in full remission: Secondary | ICD-10-CM

## 2017-10-05 MED ORDER — ESCITALOPRAM OXALATE 20 MG PO TABS: 20.0000 mg | ORAL_TABLET | Freq: Every day | ORAL | 1 refills | Status: DC

## 2017-10-05 MED ORDER — ALPRAZOLAM 0.5 MG PO TABS: 0.5000 mg | ORAL_TABLET | Freq: Two times a day (BID) | ORAL | 2 refills | Status: DC

## 2017-10-05 MED ORDER — AMITRIPTYLINE HCL 25 MG PO TABS: 75.0000 mg | ORAL_TABLET | Freq: Every day | ORAL | 1 refills | Status: DC

## 2017-10-05 NOTE — Progress Notes (Signed)
BH MD/PA/NP OP Progress Note  10/05/2017 3:04 PM ZYLPHA POYNOR  MRN:  [id number]  Chief Complaint: med management  HPI:  Today this patient was seen in her first assessment for me.  She is been seen by our staff psychiatrist up until recently.  The patient was stable bed and she is very stable now.  This 72 year old widow lives at [facility] alone.  She is to successful sons 5 grandchildren all of whom are doing well.  Presently she is in a relationship with an older man who she loves to some degree but has somewhat of a conflictual relationship.  The patient is retired.  The patient denies persistent daily depression.  She is sleeping and eating well.  She is got fair energy.  Her thinking concentration is good.  She does have some issues with her sense of worth.  She is not suicidal now but she made a suicide attempt in 2001.  The patient enjoys many things.  She reads watches TV gets on the computer.  The patient drove here for this appointment.  The patient denies the use of alcohol or drugs.  She has never been psychotic.  She did episode up to daily depression and what sounds like major depression 2007.  Noted in 2001 she was depressed made a suicide attempt and was hospitalized at [facility].  Financially the patient is stable.  She had the death of her cousin this year.  The patient denies symptoms of generalized anxiety disorder, panic disorder or obsessive-compulsive disorder.  Today the patient actually came late for this appointment but we made some special time to get her in. Her medical illnesses include rheumatoid arthritis multiple back surgery and neck surgery and fibromyalgia. Her psychiatric history is significant for a psychiatric hospitalization in 2001 in association with depression and suicidality.  The patient was seen receiving her psychiatric care from a psychiatrist and therapist in [facility].  She decided that was too far to go and is changed her  care to the center.  Presently she is not in therapy.  She takes her medicines just as prescribed.  This includes Elavil 25 mg 3 at night, Lexapro 20 mg in the morning, Xanax 0.5 mg twice daily. The patient denies chest pain or shortness of breath.  Her joint pain is relatively controlled.  She denies any symptoms related to neurological problems.  She is never had a seizure has no headaches but is never had a focal deficit.  The patient denies being suicidal or homicidal.  She lives independently.  She is functioning very well. Visit Diagnosis:    ICD-10-CM   1. Severe episode of recurrent major depressive disorder, without psychotic features (HCC) F33.2 amitriptyline (ELAVIL) 25 MG tablet    ALPRAZolam (XANAX) 0.5 MG tablet    escitalopram (LEXAPRO) 20 MG tablet    Past Psychiatric History: See intake H&P for full details. Reviewed, with no updates at this time.   Past Medical History:  Past Medical History:  Diagnosis Date  . Allergy   . Anxiety   . Arthritis   . Cough    due allergies chronic  . Depression   . Dilatation of esophagus   . Fibromyalgia   . GERD (gastroesophageal reflux disease)   . Headache(784.0)   . Heart murmur    one time  . Hypertension   . PONV (postoperative nausea and vomiting)    hx of Vertigo after anesthesia    Past Surgical History:  Procedure Laterality Date  . ABDOMINAL HYSTERECTOMY    . BACK SURGERY  12/21/12  . CHOLECYSTECTOMY    . COLONOSCOPY    . EYE SURGERY Bilateral    lasik    Family Psychiatric History: See intake H&P for full details. Reviewed, with no updates at this time.   Family History: No family history on file.  Social History:  Social History   Socioeconomic History  . Marital status: Widowed    Spouse name: Not on file  . Number of children: Not on file  . Years of education: Not on file  . Highest education level: Not on file  Occupational History  . Not on file  Social Needs  . Financial resource strain:  Not on file  . Food insecurity:    Worry: Not on file    Inability: Not on file  . Transportation needs:    Medical: Not on file    Non-medical: Not on file  Tobacco Use  . Smoking status: Never Smoker  . Smokeless tobacco: Never Used  Substance and Sexual Activity  . Alcohol use: No    Comment: social  . Drug use: No  . Sexual activity: Never  Lifestyle  . Physical activity:    Days per week: Not on file    Minutes per session: Not on file  . Stress: Not on file  Relationships  . Social connections:    Talks on phone: Not on file    Gets together: Not on file    Attends religious service: Not on file    Active member of club or organization: Not on file    Attends meetings of clubs or organizations: Not on file    Relationship status: Not on file  Other Topics Concern  . Not on file  Social History Narrative  . Not on file    Allergies:  Allergies  Allergen Reactions  . Lisinopril Cough    Deep cough  . Amoxicillin Rash    Has patient had a PCN reaction causing immediate rash, facial/tongue/throat swelling, SOB or lightheadedness with hypotension:unsure Has patient had a PCN reaction causing severe rash involving mucus membranes or skin necrosis:unsure Has patient had a PCN reaction that required hospitalization:NO  . Has patient had a PCN reaction occurring within the last 10 years:    Yes If all of the above answers are "NO", then may proceed with Cephalosporin use.   . Cephalosporins Other (See Comments)    Flushing of the face    Metabolic Disorder Labs: No results found for: HGBA1C, MPG No results found for: PROLACTIN No results found for: CHOL, TRIG, HDL, CHOLHDL, VLDL, LDLCALC No results found for: TSH  Therapeutic Level Labs: No results found for: LITHIUM No results found for: VALPROATE No components found for:  CBMZ  Current Medications: Current Outpatient Medications  Medication Sig Dispense Refill  . ALPRAZolam (XANAX) 0.5 MG tablet Take 1  tablet (0.5 mg total) by mouth 2 (two) times daily. 60 tablet 2  . amitriptyline (ELAVIL) 25 MG tablet Take 3 tablets (75 mg total) by mouth at bedtime. 270 tablet 1  . Artificial Tear Ointment (SOOTHE NIGHT TIME) OINT Place 1 application into both eyes at bedtime.    Marland Kitchen atorvastatin (LIPITOR) 20 MG tablet Take 20 mg by mouth every Wednesday.     . Biotin w/ Vitamins C & E (HAIR SKIN & NAILS GUMMIES PO) Take 2 tablets by mouth daily.    Marland Kitchen CALCIUM-VITAMIN D-VITAMIN K PO Take 1  tablet by mouth daily with lunch.    . Cholecalciferol (VITAMIN D) 2000 UNITS CAPS Take 2,000 Units by mouth daily.    . clobetasol ointment (TEMOVATE) 0.05 % Apply 1 application topically 2 (two) times daily as needed (for rash).    . COD LIVER OIL PO Take 5 mLs by mouth daily.    Marland Kitchen docusate sodium (RA COL-RITE) 100 MG capsule Take 100 mg by mouth 2 (two) times daily.    Marland Kitchen escitalopram (LEXAPRO) 20 MG tablet Take 1 tablet (20 mg total) by mouth daily. 90 tablet 1  . folic acid (FOLVITE) 1 MG tablet Take 1 mg by mouth daily.    . hydrocortisone 2.5 % cream Apply 1 application topically 2 (two) times daily as needed (for skin irritation/rash).     Marland Kitchen losartan (COZAAR) 100 MG tablet Take 100 mg by mouth daily.    . Magnesium 250 MG TABS Take 250 mg by mouth daily.    . methotrexate (RHEUMATREX) 2.5 MG tablet Take by mouth.    Marland Kitchen omeprazole (PRILOSEC OTC) 20 MG tablet Take 20 mg by mouth daily before breakfast.    . Propylene Glycol (SYSTANE BALANCE) 0.6 % SOLN Place 1 drop into both eyes 4 (four) times daily as needed (for dry/irritated eyes (scheduled EVERY MORNING)).     No current facility-administered medications for this visit.      Musculoskeletal: Strength & Muscle Tone: within normal limits Gait & Station: normal Patient leans: N/A  Psychiatric Specialty Exam: ROS  Blood pressure 132/70, pulse 76, height 5' (1.524 m), weight 95 lb (43.1 kg), SpO2 99 %.Body mass index is 18.55 kg/m.  General Appearance: Casual  and Fairly Groomed  Eye Contact:  Fair  Speech:  Clear and Coherent and Normal Rate  Volume:  Normal  Mood:  Anxious and Dysphoric  Affect:  Appropriate and Congruent  Thought Process:  Goal Directed and Descriptions of Associations: Intact  Orientation:  Full (Time, Place, and Person)  Thought Content: Logical   Suicidal Thoughts:  No  Homicidal Thoughts:  No  Memory:  Immediate;   Fair  Judgement:  Intact  Insight:  Shallow  Psychomotor Activity:  Normal  Concentration:  Attention Span: Fair  Recall:  [employer] of Knowledge: Fair  Language: Good  Akathisia:  Negative  Handed:  Right  AIMS (if indicated): not done  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  ADL's:  Intact  Cognition: WNL  Sleep:  Good   Screenings:   Assessment and Plan:  This patient's #1 problem is that a history of major depression.  Presently she takes Lexapro and Elavil for this condition.  Her second problem is associated with anxiety which is well controlled with Xanax 0.5 mg twice daily.  The patient is medically very stable.  She is not suicidal and she is functioning very well.  At this time she will continue seeing me every 3 months.  We will continue her present medication regime. 1. Severe episode of recurrent major depressive disorder, without psychotic features (HCC)     Status of current problems: unchanged  Labs Ordered: No orders of the defined types were placed in this encounter.   Labs Reviewed: NA  Collateral Obtained/Records Reviewed: NA  Plan:  Continue Xanax, Lexapro, Elavil as prescribed RTC 3 MONTHS   Gypsy Balsam, MD 10/05/2017, 3:04 PM

## 2018-01-20 ENCOUNTER — Encounter (HOSPITAL_COMMUNITY): Payer: Self-pay | Admitting: Psychiatry

## 2018-01-20 ENCOUNTER — Ambulatory Visit (HOSPITAL_COMMUNITY): Payer: Medicare HMO | Admitting: Psychiatry

## 2018-01-20 VITALS — BP 120/72 | HR 69 | Ht 60.5 in | Wt 98.0 lb

## 2018-01-20 DIAGNOSIS — F332 Major depressive disorder, recurrent severe without psychotic features: Secondary | ICD-10-CM | POA: Diagnosis not present

## 2018-01-20 DIAGNOSIS — F339 Major depressive disorder, recurrent, unspecified: Secondary | ICD-10-CM

## 2018-01-20 MED ORDER — ZOLPIDEM TARTRATE 5 MG PO TABS: 5.0000 mg | ORAL_TABLET | Freq: Every evening | ORAL | Status: DC | PRN

## 2018-01-20 MED ORDER — ALPRAZOLAM 0.5 MG PO TABS: 0.5000 mg | ORAL_TABLET | Freq: Two times a day (BID) | ORAL | 2 refills | Status: DC

## 2018-01-20 MED ORDER — ZOLPIDEM TARTRATE 5 MG PO TABS: 5.0000 mg | ORAL_TABLET | Freq: Every evening | ORAL | 0 refills | Status: DC | PRN

## 2018-01-20 MED ORDER — ESCITALOPRAM OXALATE 20 MG PO TABS: 20.0000 mg | ORAL_TABLET | Freq: Every day | ORAL | 1 refills | Status: DC

## 2018-01-20 NOTE — Progress Notes (Signed)
BH MD/PA/NP OP Progress Note  01/20/2018 10:40 AM Carrie PinchLinda M Grine  MRN:  [id number]  Chief Complaint: Major depression remission  HPI:  This patient is being seen for clinical depression.  She had a past history of being suicidal.  Her central complaint is that she just does not feel herself and feels sad.  Her depression is not persistent.  The patient has a good relationship with her boyfriend.  The patient does not like the holidays.  She feels scattered.  She feels uncomfortable.  She denies persistent depression or anxiety.  She still enjoys reading TV and music.  Her oldest son lives across from her and she has a fairly good relationship with him.  Recently the patient's been diagnosed with a B12 deficiency and is getting replacement at this time.  I suspect that has some effect on her mood state as well as her cognitive abilities.  The patient is sleeping and eating well.  She says her concentration is fairly good.  But she describes significant sense of worthlessness.  Her self-esteem is reduced.  The patient lives alone owns her own home and likes it.  She denies the use of alcohol or drugs.  The patient denies insomnia because she takes Elavil.  Her energy level is fair.  Her anxiety level is low.  She denies any physical complaints.  She denies chest pain shortness of breath or any neurological symptoms.  Her energy level is fair.  She continues to function fairly well. Visit Diagnosis:    ICD-10-CM   1. Severe episode of recurrent major depressive disorder, without psychotic features (HCC) F33.2 ALPRAZolam (XANAX) 0.5 MG tablet    escitalopram (LEXAPRO) 20 MG tablet    Past Psychiatric History: See intake H&P for full details. Reviewed, with no updates at this time.   Past Medical History:  Past Medical History:  Diagnosis Date  . Allergy   . Anxiety   . Arthritis   . Cough    due allergies chronic  . Depression   . Dilatation of esophagus   . Fibromyalgia   . GERD  (gastroesophageal reflux disease)   . Headache(784.0)   . Heart murmur    one time  . Hypertension   . PONV (postoperative nausea and vomiting)    hx of Vertigo after anesthesia    Past Surgical History:  Procedure Laterality Date  . ABDOMINAL HYSTERECTOMY    . BACK SURGERY  12/21/12  . CHOLECYSTECTOMY    . COLONOSCOPY    . EYE SURGERY Bilateral    lasik    Family Psychiatric History: See intake H&P for full details. Reviewed, with no updates at this time.   Family History: No family history on file.  Social History:  Social History   Socioeconomic History  . Marital status: Widowed    Spouse name: Not on file  . Number of children: Not on file  . Years of education: Not on file  . Highest education level: Not on file  Occupational History  . Not on file  Social Needs  . Financial resource strain: Not on file  . Food insecurity:    Worry: Not on file    Inability: Not on file  . Transportation needs:    Medical: Not on file    Non-medical: Not on file  Tobacco Use  . Smoking status: Never Smoker  . Smokeless tobacco: Never Used  Substance and Sexual Activity  . Alcohol use: No    Comment: social  .  Drug use: No  . Sexual activity: Never  Lifestyle  . Physical activity:    Days per week: Not on file    Minutes per session: Not on file  . Stress: Not on file  Relationships  . Social connections:    Talks on phone: Not on file    Gets together: Not on file    Attends religious service: Not on file    Active member of club or organization: Not on file    Attends meetings of clubs or organizations: Not on file    Relationship status: Not on file  Other Topics Concern  . Not on file  Social History Narrative  . Not on file    Allergies:  Allergies  Allergen Reactions  . Lisinopril Cough    Deep cough  . Amoxicillin Rash    Has patient had a PCN reaction causing immediate rash, facial/tongue/throat swelling, SOB or lightheadedness with  hypotension:unsure Has patient had a PCN reaction causing severe rash involving mucus membranes or skin necrosis:unsure Has patient had a PCN reaction that required hospitalization:NO  . Has patient had a PCN reaction occurring within the last 10 years:    Yes If all of the above answers are "NO", then may proceed with Cephalosporin use.   . Cephalosporins Other (See Comments)    Flushing of the face    Metabolic Disorder Labs: No results found for: HGBA1C, MPG No results found for: PROLACTIN No results found for: CHOL, TRIG, HDL, CHOLHDL, VLDL, LDLCALC No results found for: TSH  Therapeutic Level Labs: No results found for: LITHIUM No results found for: VALPROATE No components found for:  CBMZ  Current Medications: Current Outpatient Medications  Medication Sig Dispense Refill  . ALPRAZolam (XANAX) 0.5 MG tablet Take 1 tablet (0.5 mg total) by mouth 2 (two) times daily. 60 tablet 2  . amitriptyline (ELAVIL) 25 MG tablet Take 3 tablets (75 mg total) by mouth at bedtime. 270 tablet 1  . Artificial Tear Ointment (SOOTHE NIGHT TIME) OINT Place 1 application into both eyes at bedtime.    Marland Kitchen atorvastatin (LIPITOR) 20 MG tablet Take 20 mg by mouth every Wednesday.     . Biotin w/ Vitamins C & E (HAIR SKIN & NAILS GUMMIES PO) Take 2 tablets by mouth daily.    Marland Kitchen CALCIUM-VITAMIN D-VITAMIN K PO Take 1 tablet by mouth daily with lunch.    . Cholecalciferol (VITAMIN D) 2000 UNITS CAPS Take 2,000 Units by mouth daily.    . clobetasol ointment (TEMOVATE) 0.05 % Apply 1 application topically 2 (two) times daily as needed (for rash).    . COD LIVER OIL PO Take 5 mLs by mouth daily.    Marland Kitchen docusate sodium (RA COL-RITE) 100 MG capsule Take 100 mg by mouth 2 (two) times daily.    Marland Kitchen escitalopram (LEXAPRO) 20 MG tablet Take 1 tablet (20 mg total) by mouth daily. 90 tablet 1  . folic acid (FOLVITE) 1 MG tablet Take 1 mg by mouth daily.    . hydrocortisone 2.5 % cream Apply 1 application topically 2 (two)  times daily as needed (for skin irritation/rash).     Marland Kitchen losartan (COZAAR) 100 MG tablet Take 100 mg by mouth daily.    . Magnesium 250 MG TABS Take 250 mg by mouth daily.    . methotrexate (RHEUMATREX) 2.5 MG tablet Take by mouth.    Marland Kitchen omeprazole (PRILOSEC OTC) 20 MG tablet Take 20 mg by mouth daily before breakfast.    .  Propylene Glycol (SYSTANE BALANCE) 0.6 % SOLN Place 1 drop into both eyes 4 (four) times daily as needed (for dry/irritated eyes (scheduled EVERY MORNING)).    Marland Kitchen. zolpidem (AMBIEN) 5 MG tablet Take 1 tablet (5 mg total) by mouth at bedtime as needed for sleep. 30 tablet 0   No current facility-administered medications for this visit.      Musculoskeletal: Strength & Muscle Tone: within normal limits Gait & Station: normal Patient leans: N/A  Psychiatric Specialty Exam: ROS  Blood pressure 120/72, pulse 69, height 5' 0.5" (1.537 m), weight 98 lb (44.5 kg), SpO2 98 %.Body mass index is 18.82 kg/m.  General Appearance: Casual and Fairly Groomed  Eye Contact:  Fair  Speech:  Clear and Coherent and Normal Rate  Volume:  Normal  Mood:  Anxious and Dysphoric  Affect:  Appropriate and Congruent  Thought Process:  Goal Directed and Descriptions of Associations: Intact  Orientation:  Full (Time, Place, and Person)  Thought Content: Logical   Suicidal Thoughts:  No  Homicidal Thoughts:  No  Memory:  Immediate;   Fair  Judgement:  Intact  Insight:  Shallow  Psychomotor Activity:  Normal  Concentration:  Attention Span: Fair  Recall:  [employer]  Fund of Knowledge: Fair  Language: Good  Akathisia:  Negative  Handed:  Right  AIMS (if indicated): not done  Assets:  ArchitectCommunication Skills Financial Resources/Insurance Housing Intimacy Social Support Transportation  ADL's:  Intact  Cognition: WNL  Sleep:  Good   Screenings:   Assessment and Plan:  At this time the patient is #1 problem is that of a history of major depression.  She takes Elavil for this condition  together with Lexapro.  I suspect in reality the Elavil was initially prescribed for sleep.  She agrees that it was.  Today we shared the problems that could occur from Elavil.  Elavil in her age group could be making her sedated and affecting her cognitively.  Its possibility combination of Elavil together with a B12 deficiency is causing problems.  At this time she is agreed to reduce the Elavil down to 50 mg for a month then reduce to 25 mg.  Today we will also call him in some Ambien 5 mg to take if she is not sleeping well.  In essence she will come back in 2 months taking only 25 mg of Elavil may or may not be taking Ambien for sleep but she will continue taking Lexapro as prescribed.  She also continue taking Xanax 0.5 mg twice daily.  0 #1 problem seems to be major depression and her interventions are to slowly titrate off her Elavil continue her Lexapro but today will refer her for psychotherapy.  In this setting.  Her second problem really is the fact that she has insomnia.  Either the Elavil and or the Ambien can be used for this condition.  The patient is a low risk of mortality.  I do not believe she is suicidal.  I think she is a low risk in terms of severity.  The patient is not fatigued. 1. Severe episode of recurrent major depressive disorder, without psychotic features (HCC)     Status of current problems: unchanged  Labs Ordered: No orders of the defined types were placed in this encounter.   Labs Reviewed: NA  Collateral Obtained/Records Reviewed: NA  Plan:  Continue Xanax, Lexapro, Elavil as prescribed RTC 3 MONTHS   Gypsy BalsamGerald I Samariah Hokenson, MD 01/20/2018, 10:40 AM

## 2018-01-23 ENCOUNTER — Ambulatory Visit (HOSPITAL_COMMUNITY): Payer: Medicare HMO | Admitting: Psychiatry

## 2018-02-01 ENCOUNTER — Ambulatory Visit (INDEPENDENT_AMBULATORY_CARE_PROVIDER_SITE_OTHER): Payer: Medicare HMO | Admitting: Psychiatry

## 2018-02-01 ENCOUNTER — Encounter (HOSPITAL_COMMUNITY): Payer: Self-pay | Admitting: Psychiatry

## 2018-02-01 DIAGNOSIS — F3342 Major depressive disorder, recurrent, in full remission: Secondary | ICD-10-CM | POA: Diagnosis not present

## 2018-02-01 DIAGNOSIS — F4312 Post-traumatic stress disorder, chronic: Secondary | ICD-10-CM | POA: Diagnosis not present
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
## 2018-02-01 NOTE — Progress Notes (Signed)
Discard secondary note

## 2018-02-09 ENCOUNTER — Ambulatory Visit (INDEPENDENT_AMBULATORY_CARE_PROVIDER_SITE_OTHER): Payer: Medicare HMO | Admitting: Psychiatry

## 2018-02-09 ENCOUNTER — Ambulatory Visit (HOSPITAL_COMMUNITY): Payer: Medicare HMO | Admitting: Psychiatry

## 2018-02-09 ENCOUNTER — Encounter (HOSPITAL_COMMUNITY): Payer: Self-pay | Admitting: Psychiatry

## 2018-02-09 DIAGNOSIS — F339 Major depressive disorder, recurrent, unspecified: Secondary | ICD-10-CM

## 2018-02-09 NOTE — Progress Notes (Signed)
Client: Takiah Maiden  Date: 02/09/18  Time: 11:02-11:55 am  Type of Therapy: Individual Therapy  Diagnosis:?Axis I: Major Depressive Disorder, chronic, recurrent  Treatment goals addressed: Improve self-image and worth through building up confidence to communicate more effectively and mastering her daily living tasks to become more organized through applying coping and communication skills/strategies learned in therapy.  Interventions: CBT, Motivational Interviewing, Timelining, Exploration of support system  Summary: Client Jaylen, 73 yo female who presents with Major Depressive Disorder, due to relational conflict, family concerns and low self-image/worth. Counselor using therapeutic interventions to address depressive symptoms/insecurities and to prepare for redefining her relationship with dating mate of 10 years.  Therapist Response: [name] met with Counselor for individual therapy. Counselor joined with [name] as she shared about recent loss and funeral of close friend, current relationship with companion, familial issues and dating/interest history. Counselor assessed current psychiatric symptoms and life stressors with the [place]. Marinna reports having difficult feeling motivated to get out of bed 3 or 4 out of 7 days. She is unsure if it is medication, medical or mental health related. Counselor suggested she contact the psychiatric nurse to share concerns/symptoms to determine if any changes are needed. Otherwise, Rolena Infante reported being happier in her relationship with her companion because she has reduced the time she is spending with him. We explored her relationship history to determine what she desires in a partner and how she can communicate her current thoughts and feelings with her companion. We explored other social outlets and connections she can try out or look into to rebuild her self-esteem and self-worth. Counselor praised her for her courage to speak at her best friends funeral over the  weekend. She is missing her, but understands that is part of the grief cycle. She will start volunteering at [facility] again on a regular basis next week. Megham requested meeting every other week moving forward due to scheduling.  Suicidal/Homicidal: No current safety concerns. No plan/intent to harm self or others.  Plan: To return in 2 weeks. Will connect with new social outlets and contact nurse for med side effects, until next session.  ?  Lise Auer, LCSW

## 2018-02-23 ENCOUNTER — Ambulatory Visit (INDEPENDENT_AMBULATORY_CARE_PROVIDER_SITE_OTHER): Payer: Medicare HMO | Admitting: Psychiatry

## 2018-02-23 ENCOUNTER — Encounter (HOSPITAL_COMMUNITY): Payer: Self-pay | Admitting: Psychiatry

## 2018-02-23 DIAGNOSIS — F339 Major depressive disorder, recurrent, unspecified: Secondary | ICD-10-CM

## 2018-02-23 NOTE — Progress Notes (Signed)
Client: Carrie Austin  Date: 02/23/18  Time: 1:36-2:32 pm  Type of Therapy: Individual Therapy  Diagnosis:?Axis I: Major Depressive Disorder, chronic, recurrent  Treatment goals addressed: Improve self-image and worth through building up confidence to communicate more effectively and mastering her daily living tasks to become more organized through applying coping and communication skills/strategies learned in therapy.  Interventions: CBT, Motivational Interviewing, Timelining, Exploration of support system  Summary: Client Carrie Austin, 73 yo female who presents with Major Depressive Disorder, due to relational conflict, family concerns and low self-image/worth. Counselor using therapeutic interventions to address depressive symptoms/insecurities and to prepare for redefining her relationship with dating mate of 10 years.  Therapist Response: Carrie Austin met with Counselor for individual therapy. Counselor joined with Carrie Austin as she shared about health concerns, healing strategies, and we explored some past traumas and relationship patterns. Counselor assessed current psychiatric symptoms and life stressors with the [place]. Carrie Austin reports that her sleep has improved, but that she feels very tired and sluggish throughout the day, with a loss in interest to interact with others. Counselor assessed contributing factors. Carrie Austin is unsure if its seasonal, her fibromyalgia, going through her break up or depressive symptoms. Counselor encouraged Carrie Austin to contact PCP/Psychiatrist if feelings persist. Counselor taught and provided coping strategies to combat the urges to withdraw or be inactive. Carrie Austin plans to attempt implementation of strategies. Carrie Austin chose to open up about past traumas, relating them to the theme of never feeling loved by family or significant others. Counselor highlighted parallels information shared and provided psychoeducation about childhood neglect/healthy development. Counselor explored self-care options for  Carrie Austin after session, since she shared "heavy" information that could be emotionally draining. Carrie Austin would like to continue working on unresolved past issues, as she has never felt heard or understood by a provider.  Suicidal/Homicidal: No current safety concerns. No plan/intent to harm self or others.  Plan: To return in 2 weeks. Will utilize coping skills and contact nurse for med side effects, until next session.  ?  Lise Auer, LCSW

## 2018-03-09 ENCOUNTER — Ambulatory Visit (INDEPENDENT_AMBULATORY_CARE_PROVIDER_SITE_OTHER): Payer: Medicare HMO | Admitting: Psychiatry

## 2018-03-09 DIAGNOSIS — F339 Major depressive disorder, recurrent, unspecified: Secondary | ICD-10-CM

## 2018-03-09 DIAGNOSIS — F4312 Post-traumatic stress disorder, chronic: Secondary | ICD-10-CM

## 2018-03-12 ENCOUNTER — Encounter (HOSPITAL_COMMUNITY): Payer: Self-pay | Admitting: Psychiatry

## 2018-03-12 NOTE — Progress Notes (Signed)
Client: Ketara Cavness  Date: 03/09/18  Time: 1:33-2:28 pm  Type of Therapy: Individual Therapy  Diagnosis:?Axis I: Major Depressive Disorder, chronic, recurrent  Treatment goals addressed: Improve self-image and worth through building up confidence to communicate more effectively and mastering her daily living tasks to become more organized through applying coping and communication skills/strategies learned in therapy.  Interventions: CBT, Motivational Interviewing, Timelining, Exploration of support system  Summary: Client Panhia, 73 yo female who presents with Major Depressive Disorder, due to relational conflict, family concerns and low self-image/worth. Counselor using therapeutic interventions to address depressive symptoms/insecurities and to prepare for redefining her relationship with dating mate of 10 years.  Therapist Response: [name] met with Counselor for individual therapy. Counselor joined with [name] as she shared about her physical illness complications, relationship with sons and dating mate, her schedule being too full, and status of past relationships. Counselor assessed current psychiatric symptoms and life stressors with the [place]. Alayshia states that due to two chronic physical illness, she is finding it difficult to get out of bed and get going in the mornings. This makes her feel "down" about herself and frustrated some days. Counselor explored options with Jasmeet on how to combat the automatic negative thoughts and to tweak her routine to be more beneficial. Mirella noted that people in her life are noticing that she is making heathier life changes since starting therapy. Counselor praised her for setting boundaries and communicating needs clearly. Arabel intends to address 3 people in her family who she needs to make amends with in the near future, so we processed how and what she would say in those meetings. Counselor encouraged Branae to identify a self-care action she could take each day  and especially after a heavy session.  Suicidal/Homicidal: No current safety concerns. No plan/intent to harm self or others.  Plan: To return in 2 weeks. Will utilize coping skills and contact nurse for med side effects, until next session.  ?  Lise Auer, LCSW

## 2018-03-16 ENCOUNTER — Ambulatory Visit (HOSPITAL_COMMUNITY): Payer: Medicare HMO | Admitting: Psychiatry

## 2018-03-24 ENCOUNTER — Other Ambulatory Visit: Payer: Self-pay

## 2018-03-24 ENCOUNTER — Encounter (HOSPITAL_COMMUNITY): Payer: Self-pay | Admitting: Psychiatry

## 2018-03-24 ENCOUNTER — Ambulatory Visit (HOSPITAL_COMMUNITY): Payer: Medicare HMO | Admitting: Psychiatry

## 2018-03-24 VITALS — BP 139/64 | Ht 60.0 in | Wt 95.8 lb

## 2018-03-24 DIAGNOSIS — F332 Major depressive disorder, recurrent severe without psychotic features: Secondary | ICD-10-CM | POA: Diagnosis not present

## 2018-03-24 DIAGNOSIS — F3342 Major depressive disorder, recurrent, in full remission: Secondary | ICD-10-CM | POA: Diagnosis not present

## 2018-03-24 MED ORDER — ALPRAZOLAM 0.5 MG PO TABS: 0.5000 mg | ORAL_TABLET | Freq: Two times a day (BID) | ORAL | 3 refills | Status: DC

## 2018-03-24 MED ORDER — ESCITALOPRAM OXALATE 20 MG PO TABS: 20.0000 mg | ORAL_TABLET | Freq: Every day | ORAL | 1 refills | Status: DC

## 2018-03-24 NOTE — Progress Notes (Signed)
BH MD/PA/NP OP Progress Note  03/24/2018 10:54 AM Carrie Austin  MRN:  [id number]  Chief Complaint: Major depression in remission  HPI:   At this time the patient is doing well.  She is better.  It took her a while to get off the Elavil but she is off of it.  I suspect her energy level is better.  Her concentration is improved.  The patient continues in B12 treatment as her levels are yet to be normal.  Today the patient denies chest pain or shortness of breath.  She denies any fevers or chills.  She has not had.  She is moving away from her boyfriend and that is okay with him.  The sense is that her worth and value is improved.  The patient has made a good connection with her therapist here.  The patient says she is sleeping and eating well has good energy.  She denies being suicidal.  She denies any psychotic symptoms.  She denies use of drugs or alcohol.  It turns out the patient never filled her Ambien.  This is fine and she does not want to take extra medicines.  For now she takes her Lexapro and Xanax as prescribed.  Visit Diagnosis:    ICD-10-CM   1. Severe episode of recurrent major depressive disorder, without psychotic features (HCC) F33.2 ALPRAZolam (XANAX) 0.5 MG tablet    escitalopram (LEXAPRO) 20 MG tablet    Past Psychiatric History: See intake H&P for full details. Reviewed, with no updates at this time.   Past Medical History:  Past Medical History:  Diagnosis Date  . Allergy   . Anxiety   . Arthritis   . Cough    due allergies chronic  . Depression   . Dilatation of esophagus   . Fibromyalgia   . GERD (gastroesophageal reflux disease)   . Headache(784.0)   . Heart murmur    one time  . Hypertension   . PONV (postoperative nausea and vomiting)    hx of Vertigo after anesthesia    Past Surgical History:  Procedure Laterality Date  . ABDOMINAL HYSTERECTOMY    . BACK SURGERY  12/21/12  . CHOLECYSTECTOMY    . COLONOSCOPY    . EYE SURGERY Bilateral    lasik     Family Psychiatric History: See intake H&P for full details. Reviewed, with no updates at this time.   Family History: History reviewed. No pertinent family history.  Social History:  Social History   Socioeconomic History  . Marital status: Widowed    Spouse name: Not on file  . Number of children: Not on file  . Years of education: Not on file  . Highest education level: Not on file  Occupational History  . Not on file  Social Needs  . Financial resource strain: Not on file  . Food insecurity:    Worry: Not on file    Inability: Not on file  . Transportation needs:    Medical: Not on file    Non-medical: Not on file  Tobacco Use  . Smoking status: Never Smoker  . Smokeless tobacco: Never Used  Substance and Sexual Activity  . Alcohol use: No    Comment: social  . Drug use: No  . Sexual activity: Never  Lifestyle  . Physical activity:    Days per week: Not on file    Minutes per session: Not on file  . Stress: Not on file  Relationships  . Social connections:  Talks on phone: Not on file    Gets together: Not on file    Attends religious service: Not on file    Active member of club or organization: Not on file    Attends meetings of clubs or organizations: Not on file    Relationship status: Not on file  Other Topics Concern  . Not on file  Social History Narrative  . Not on file    Allergies:  Allergies  Allergen Reactions  . Lisinopril Cough    Deep cough  . Amoxicillin Rash    Has patient had a PCN reaction causing immediate rash, facial/tongue/throat swelling, SOB or lightheadedness with hypotension:unsure Has patient had a PCN reaction causing severe rash involving mucus membranes or skin necrosis:unsure Has patient had a PCN reaction that required hospitalization:NO  . Has patient had a PCN reaction occurring within the last 10 years:    Yes If all of the above answers are "NO", then may proceed with Cephalosporin use.   . Cephalosporins  Other (See Comments)    Flushing of the face    Metabolic Disorder Labs: No results found for: HGBA1C, MPG No results found for: PROLACTIN No results found for: CHOL, TRIG, HDL, CHOLHDL, VLDL, LDLCALC No results found for: TSH  Therapeutic Level Labs: No results found for: LITHIUM No results found for: VALPROATE No components found for:  CBMZ  Current Medications: Current Outpatient Medications  Medication Sig Dispense Refill  . ALPRAZolam (XANAX) 0.5 MG tablet Take 1 tablet (0.5 mg total) by mouth 2 (two) times daily. 60 tablet 3  . amitriptyline (ELAVIL) 25 MG tablet Take 3 tablets (75 mg total) by mouth at bedtime. 270 tablet 1  . Artificial Tear Ointment (SOOTHE NIGHT TIME) OINT Place 1 application into both eyes at bedtime.    Marland Kitchen atorvastatin (LIPITOR) 20 MG tablet Take 20 mg by mouth every Wednesday.     . Biotin w/ Vitamins C & E (HAIR SKIN & NAILS GUMMIES PO) Take 2 tablets by mouth daily.    Marland Kitchen CALCIUM-VITAMIN D-VITAMIN K PO Take 1 tablet by mouth daily with lunch.    . Cholecalciferol (VITAMIN D) 2000 UNITS CAPS Take 2,000 Units by mouth daily.    . clobetasol ointment (TEMOVATE) 0.05 % Apply 1 application topically 2 (two) times daily as needed (for rash).    . COD LIVER OIL PO Take 5 mLs by mouth daily.    Marland Kitchen docusate sodium (RA COL-RITE) 100 MG capsule Take 100 mg by mouth 2 (two) times daily.    Marland Kitchen escitalopram (LEXAPRO) 20 MG tablet Take 1 tablet (20 mg total) by mouth daily. 90 tablet 1  . folic acid (FOLVITE) 1 MG tablet Take 1 mg by mouth daily.    . hydrocortisone 2.5 % cream Apply 1 application topically 2 (two) times daily as needed (for skin irritation/rash).     Marland Kitchen losartan (COZAAR) 100 MG tablet Take 100 mg by mouth daily.    . Magnesium 250 MG TABS Take 250 mg by mouth daily.    . methotrexate (RHEUMATREX) 2.5 MG tablet Take by mouth.    Marland Kitchen omeprazole (PRILOSEC OTC) 20 MG tablet Take 20 mg by mouth daily before breakfast.    . Propylene Glycol (SYSTANE BALANCE)  0.6 % SOLN Place 1 drop into both eyes 4 (four) times daily as needed (for dry/irritated eyes (scheduled EVERY MORNING)).    Marland Kitchen zolpidem (AMBIEN) 5 MG tablet Take 1 tablet (5 mg total) by mouth at bedtime as needed  for sleep. 30 tablet 0   No current facility-administered medications for this visit.      Musculoskeletal: Strength & Muscle Tone: within normal limits Gait & Station: normal Patient leans: N/A  Psychiatric Specialty Exam: ROS  Blood pressure 139/64, height 5' (1.524 m), weight 95 lb 12.8 oz (43.5 kg).Body mass index is 18.71 kg/m.  General Appearance: Casual and Fairly Groomed  Eye Contact:  Fair  Speech:  Clear and Coherent and Normal Rate  Volume:  Normal  Mood:  Anxious and Dysphoric  Affect:  Appropriate and Congruent  Thought Process:  Goal Directed and Descriptions of Associations: Intact  Orientation:  Full (Time, Place, and Person)  Thought Content: Logical   Suicidal Thoughts:  No  Homicidal Thoughts:  No  Memory:  Immediate;   Fair  Judgement:  Intact  Insight:  Shallow  Psychomotor Activity:  Normal  Concentration:  Attention Span: Fair  Recall:  FiservFair  Fund of Knowledge: Fair  Language: Good  Akathisia:  Negative  Handed:  Right  AIMS (if indicated): not done  Assets:  ArchitectCommunication Skills Financial Resources/Insurance Housing Intimacy Social Support Transportation  ADL's:  Intact  Cognition: WNL  Sleep:  Good   Screenings:   Assessment and Plan:    The patient's first problem is that of clinical depression.  She takes Lexapro and does very well.  The best intervention however is being in therapy which she seems to have connected with.  The patient has no vegetative symptoms at this time.  Importantly her cognition seems to be improved and her concentration better as her mood is gotten better and as she is been getting B12.  Her B12 level is yet to be normal and we will reassess this when she returns to see us.  For the most part the  patient is doing well.  She is not suicidal she is functioning very well and she will return to see me in 3 months. 1. Severe episode of recurrent major depressive disorder, without psychotic features (HCC)     Status of current problems: unchanged  Labs Ordered: No orders of the defined types were placed in this encounter.   Labs Reviewed: NA  Collateral Obtained/Records Reviewed: NA  Plan:  Continue Xanax, Lexapro, Elavil as prescribed RTC 3 MONTHS   Gypsy BalsamGerald I Ilze Roselli, MD 03/24/2018, 10:54 AM

## 2018-03-30 ENCOUNTER — Encounter (HOSPITAL_COMMUNITY): Payer: Self-pay | Admitting: Psychiatry

## 2018-03-30 ENCOUNTER — Ambulatory Visit (INDEPENDENT_AMBULATORY_CARE_PROVIDER_SITE_OTHER): Payer: Medicare HMO | Admitting: Psychiatry

## 2018-03-30 ENCOUNTER — Other Ambulatory Visit: Payer: Self-pay

## 2018-03-30 DIAGNOSIS — F3342 Major depressive disorder, recurrent, in full remission: Secondary | ICD-10-CM | POA: Diagnosis not present

## 2018-03-30 DIAGNOSIS — F4312 Post-traumatic stress disorder, chronic: Secondary | ICD-10-CM

## 2018-03-30 NOTE — Progress Notes (Signed)
Client: Abbagail Scaff  Date: 03/30/18  Time: 1:33-2:28 pm  Type of Therapy: Individual Therapy  Diagnosis:?Axis I: Major Depressive Disorder, chronic, recurrent  Treatment goals addressed: Improve self-image and worth through building up confidence to communicate more effectively and mastering her daily living tasks to become more organized through applying coping and communication skills/strategies learned in therapy.  Interventions: CBT, Motivational Interviewing, Timelining, Exploration of support system  Summary: Client Mina, 73 yo female who presents with Major Depressive Disorder, due to relational conflict, family concerns and low self-image/worth. Counselor using therapeutic interventions to address depressive symptoms/insecurities and to prepare for redefining her relationship with dating mate of 10 years.  Therapist Response: [name] met with Counselor for individual therapy. Counselor joined with [name] as she shared about the impact of Coronavirus on her, sleep issues, communications with past partner, death in the family, progress in relationships and ending current relationship. Counselor assessed current psychiatric symptoms and life stressors with the [place]. Coretta reported an improved mood, but still has concerns of fatigue and sleep issues. Counselor processed emotions related to "big conversations" she planned to have with several people in her life. Counselor praised [place] for her assertiveness and bravery to express her true thoughts and feelings. Counselor and Qunisha processed impact of those conversations and how it will improve her well-being. Counselor discussed healthy coping skills with [place], discussed safety in relationships and encouraged assertiveness. Annalynn reports therapy sessions as helpful in having a safe space to talk about her thoughts and feelings.  Suicidal/Homicidal: No current safety concerns. No plan/intent to harm self or others.  Plan: To return in 2 weeks. Will  utilize coping skills and contact nurse for med side effects, until next session.  ?  Lise Auer, LCSW

## 2018-04-13 ENCOUNTER — Other Ambulatory Visit: Payer: Self-pay

## 2018-04-13 ENCOUNTER — Encounter (HOSPITAL_COMMUNITY): Payer: Self-pay | Admitting: Psychiatry

## 2018-04-13 ENCOUNTER — Ambulatory Visit (INDEPENDENT_AMBULATORY_CARE_PROVIDER_SITE_OTHER): Payer: Medicare HMO | Admitting: Psychiatry

## 2018-04-13 DIAGNOSIS — F4312 Post-traumatic stress disorder, chronic: Secondary | ICD-10-CM

## 2018-04-13 DIAGNOSIS — F3342 Major depressive disorder, recurrent, in full remission: Secondary | ICD-10-CM | POA: Diagnosis not present

## 2018-04-13 NOTE — Progress Notes (Signed)
Virtual Visit via Video Note  I connected with Carrie Austin on 04/13/18 at  1:30 PM EDT by a video enabled telemedicine application and verified that I am speaking with the correct person using two identifiers.   I discussed the limitations of evaluation and management by telemedicine and the availability of in person appointments. The patient expressed understanding and agreed to proceed.  History of Present Illness: MDD and PTSD due to adverse life experiences, relational issues and stage of life issues.    Observations/Objective: Counselor met via webex with Carrie Austin for individual therapy. Counselor checked in to assess psychiatric symptoms and how she has been impacted by the COVID-19 restrictions. Counselor processed safety measures and concerns. Counselor assessed daily functioning and support system. Counselor praised Carrie Austin for her communication skills and working to actively address her treatment goals between sessions. Counselor promoted utilization of coping skills and encouraged Carrie Austin to continue on a structured routine and in setting appropriate boundaries with individuals in her life. Carrie Austin discussed medication concerns related to the inability to cry. Counselor and Carrie Austin discussed other ways to express sadness and have an outlet for big emotions.   Assessment and Plan: Carrie Austin is maintaining mental health well overall. We will continue to meet via webex for future sessions.   Follow Up Instructions: Counselor will send link for webex before next session. Carrie Austin will continue using healthy coping strategies to address mental health needs.    I discussed the assessment and treatment plan with the patient. The patient was provided an opportunity to ask questions and all were answered. The patient agreed with the plan and demonstrated an understanding of the instructions.   The patient was advised to call back or seek an in-person evaluation if the symptoms worsen or if the condition  fails to improve as anticipated.  I provided 45 minutes of non-face-to-face time during this encounter.   Lise Auer, LCSW

## 2018-04-27 ENCOUNTER — Ambulatory Visit (INDEPENDENT_AMBULATORY_CARE_PROVIDER_SITE_OTHER): Payer: Medicare HMO | Admitting: Psychiatry

## 2018-04-27 ENCOUNTER — Encounter (HOSPITAL_COMMUNITY): Payer: Self-pay | Admitting: Psychiatry

## 2018-04-27 ENCOUNTER — Other Ambulatory Visit: Payer: Self-pay

## 2018-04-27 DIAGNOSIS — F4312 Post-traumatic stress disorder, chronic: Secondary | ICD-10-CM

## 2018-04-27 NOTE — Progress Notes (Signed)
Virtual Visit via Video Note  I connected with Earnest Rosier on 04/27/18 at  1:30 PM EDT by a video enabled telemedicine application and verified that I am speaking with the correct person using two identifiers.   I discussed the limitations of evaluation and management by telemedicine and the availability of in person appointments. The patient expressed understanding and agreed to proceed.  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    Observations/Objective: Counselor met with [name] via [employer] for individual therapy. Counselor assessed mental health symptoms. Kylie reported having increased feelings of depression this week, which feeling foggy, forgetful and "blah". Counselor processed ideas in tweaking her routine, schedule, adding projects and discussed mindfulness techniques. Julieann sounded interested in trying out new mindfulness activities. Counselor provided psychoeducation on the benefits of mindfulness and shared some examples. Counselor explored conflicts within her family and support system. Counselor praised her for her progress in treatment and the personal qualities she has that help her coping with difficult situations. Counselor summarized session.   Assessment and Plan: Kandance would like to meet again in 2 weeks via Webex. Counselor will send additional information on mindfulness.   Follow Up Instructions: Counselor will send link for next Webex session.    I discussed the assessment and treatment plan with the patient. The patient was provided an opportunity to ask questions and all were answered. The patient agreed with the plan and demonstrated an understanding of the instructions.   The patient was advised to call back or seek an in-person evaluation if the symptoms worsen or if the condition fails to improve as anticipated.  I provided 53 minutes of non-face-to-face time during this  encounter.   Lise Auer, LCSW

## 2018-05-11 ENCOUNTER — Ambulatory Visit (INDEPENDENT_AMBULATORY_CARE_PROVIDER_SITE_OTHER): Payer: Medicare HMO | Admitting: Psychiatry

## 2018-05-11 ENCOUNTER — Other Ambulatory Visit: Payer: Self-pay

## 2018-05-11 DIAGNOSIS — F3342 Major depressive disorder, recurrent, in full remission: Secondary | ICD-10-CM

## 2018-05-11 DIAGNOSIS — F4312 Post-traumatic stress disorder, chronic: Secondary | ICD-10-CM

## 2018-05-12 ENCOUNTER — Encounter (HOSPITAL_COMMUNITY): Payer: Self-pay | Admitting: Psychiatry

## 2018-05-12 NOTE — Progress Notes (Signed)
Virtual Visit via Video Note  I connected with Carrie Austin on 05/12/18 at  2:30 PM EDT by a video enabled telemedicine application and verified that I am speaking with the correct person using two identifiers.  Location: Patient: Carrie Austin Provider: Lise Auer, LCSW   I discussed the limitations of evaluation and management by telemedicine and the availability of in person appointments. The patient expressed understanding and agreed to proceed.  History of Present Illness: Chronic PTSD and MDD due to adverse life experiences, stage of life issues and relationship issues.    Observations/Objective: Counselor met with Carrie Austin to provide individual therapy via webex. Counselor assessed MH symptoms. Carrie Austin reported that she has reported that she has been "ok", but is mostly concerned about her concentration and memory issues. She gets upset at herself when she gets side tracked or is unable to finish tasks or projects. Counselor and Carrie Austin problem solved and discussed strategies she could use to address these issues. Counselor explored relationship concerns Carrie Austin has been having. Counselor praised Carrie Austin for her ability to put up boundaries and communicate her needs. Carrie Austin reported that she would like to focus more on herself and less on being in a relationship with a significant other. Carrie Austin reports feeling more at peace with the direction ending her current relationship is going. Counselor explored what "focusing more on herself" looks like and encouraged her to journal some thoughts down about how she wants the next chapter of her life to look for [place] to discuss at the next session.   Assessment and Plan: Counselor will continue to meet with Carrie Austin for individual therapy to address treatment plan goals. Carrie Austin will practice mindfulness skills to help with anxiety, distractibly and memory issues.   Follow Up Instructions: Counselor will set up webex link for next session.    I discussed the  assessment and treatment plan with the patient. The patient was provided an opportunity to ask questions and all were answered. The patient agreed with the plan and demonstrated an understanding of the instructions.   The patient was advised to call back or seek an in-person evaluation if the symptoms worsen or if the condition fails to improve as anticipated.  I provided 55 minutes of non-face-to-face time during this encounter.   Lise Auer, LCSW

## 2018-05-25 ENCOUNTER — Ambulatory Visit (HOSPITAL_COMMUNITY): Payer: Medicare HMO | Admitting: Psychiatry

## 2018-06-08 ENCOUNTER — Ambulatory Visit (INDEPENDENT_AMBULATORY_CARE_PROVIDER_SITE_OTHER): Payer: Medicare HMO | Admitting: Psychiatry

## 2018-06-08 ENCOUNTER — Other Ambulatory Visit: Payer: Self-pay

## 2018-06-08 ENCOUNTER — Encounter (HOSPITAL_COMMUNITY): Payer: Self-pay | Admitting: Psychiatry

## 2018-06-08 DIAGNOSIS — F3342 Major depressive disorder, recurrent, in full remission: Secondary | ICD-10-CM

## 2018-06-08 DIAGNOSIS — F4312 Post-traumatic stress disorder, chronic: Secondary | ICD-10-CM

## 2018-06-08 NOTE — Progress Notes (Signed)
Virtual Visit via Video Note  I connected with Carrie Austin on 06/08/18 at  2:30 PM EDT by a video enabled telemedicine application and verified that I am speaking with the correct person using two identifiers.  Location: Patient: Carrie Austin Provider: Lise Auer, LCSW   I discussed the limitations of evaluation and management by telemedicine and the availability of in person appointments. The patient expressed understanding and agreed to proceed.  History of Present Illness: MDD and chronic PTSD due to adverse life experiences.    Observations/Objective: Counselor met with Carrie Austin for individual therapy via Webex. Counselor assessed MH symptoms and progress on treatment plan goals. Carrie Austin denied suicidal ideation or self-harm behaviors. Carrie Austin shared that she has been sick with the shingles, which caused her to stay in bed for over a week, throwing her off of her new routine of walking daily to alleviate some of the depression and anxiety she was starting to feel. Carrie Austin reported sleep concerns associated with her poor night time routine. Counselor used solution focused interventions to help Carrie Austin identify more healthy sleeping routines. Praise reported that she has been hesitant to start her new sleep medication recently prescribed by her psychiatrist because she is fearful it will work too well causing her to over sleep and be sluggish. Counselor and Carrie Austin discussed a plan to try her new medication and how to give feedback to her doctor if she has concerns. Counselor explored dynamics in [employer] and her support system. Counselor emphasized safety and applying assertive communication skills to set boundaries with others when needed. Counselor discussed scheduling changes and planned for next session.   Assessment and Plan: Counselor will continue to meet with Carrie Austin to address treatment plan goals. Carrie Austin will continue to follow recommendations of providers and implement skills  learned in session.  Follow Up Instructions: Counselor will send information for next session via Webex.   I discussed the assessment and treatment plan with the patient. The patient was provided an opportunity to ask questions and all were answered. The patient agreed with the plan and demonstrated an understanding of the instructions.   The patient was advised to call back or seek an in-person evaluation if the symptoms worsen or if the condition fails to improve as anticipated.  I provided 60 minutes of non-face-to-face time during this encounter.   Lise Auer, LCSW

## 2018-06-23 ENCOUNTER — Ambulatory Visit (INDEPENDENT_AMBULATORY_CARE_PROVIDER_SITE_OTHER): Payer: Medicare HMO | Admitting: Psychiatry

## 2018-06-23 ENCOUNTER — Other Ambulatory Visit: Payer: Self-pay

## 2018-06-23 DIAGNOSIS — F325 Major depressive disorder, single episode, in full remission: Secondary | ICD-10-CM | POA: Diagnosis not present

## 2018-06-23 DIAGNOSIS — F332 Major depressive disorder, recurrent severe without psychotic features: Secondary | ICD-10-CM

## 2018-06-23 MED ORDER — ESCITALOPRAM OXALATE 20 MG PO TABS: 20.0000 mg | ORAL_TABLET | Freq: Every day | ORAL | 1 refills | Status: DC

## 2018-06-23 MED ORDER — ALPRAZOLAM 0.5 MG PO TABS: 0.5000 mg | ORAL_TABLET | Freq: Two times a day (BID) | ORAL | 5 refills | Status: DC

## 2018-06-23 NOTE — Progress Notes (Signed)
BH MD/PA/NP OP Progress Note  06/23/2018 11:41 AM Carrie Austin  MRN:  [id number]  Chief Complaint: Major depression in remission  HPI:   Today the patient is actually doing quite well.  She is being treated here for major depression which I think is in remission.  A number of things have occurred since I have seen her.  The most significant one is that she is ended a 9-year relationship with a man that things were not just working well.  The patient has been a friend at another old friend who is a female friend who is not actually that well.  The patient seems to be able to tolerate the ending of this relationship.  She is in a good therapeutic relationship here with a therapist.  Patient also had an episode of shingles which now is over.  The patient is taking care of herself well.  She is sleeping and eating well.  She likes to read TV and does her own cooking.  She lives independently.  She denies abuse of alcohol or drugs.  She has 2 sons both of whom are doing well.  Overall the patient is surviving well.  She is following all the instructions about wearing a mask and isolating herself somewhat.  She is not overwhelmed with the concept of the virus.  Today she remains positive and optimistic.  She is functioning very well.  She takes her Xanax and Lexapro just as prescribed.  Visit Diagnosis:    ICD-10-CM   1. Severe episode of recurrent major depressive disorder, without psychotic features ([place])  F33.2 ALPRAZolam (XANAX) 0.5 MG tablet    escitalopram (LEXAPRO) 20 MG tablet    Past Psychiatric History: See intake H&P for full details. Reviewed, with no updates at this time.   Past Medical History:  Past Medical History:  Diagnosis Date  . Allergy   . Anxiety   . Arthritis   . Cough    due allergies chronic  . Depression   . Dilatation of esophagus   . Fibromyalgia   . GERD (gastroesophageal reflux disease)   . Headache(784.0)   . Heart murmur    one time  . Hypertension   .  PONV (postoperative nausea and vomiting)    hx of Vertigo after anesthesia    Past Surgical History:  Procedure Laterality Date  . ABDOMINAL HYSTERECTOMY    . BACK SURGERY  12/21/12  . CHOLECYSTECTOMY    . COLONOSCOPY    . EYE SURGERY Bilateral    lasik    Family Psychiatric History: See intake H&P for full details. Reviewed, with no updates at this time.   Family History: No family history on file.  Social History:  Social History   Socioeconomic History  . Marital status: Widowed    Spouse name: Not on file  . Number of children: Not on file  . Years of education: Not on file  . Highest education level: Not on file  Occupational History  . Not on file  Social Needs  . Financial resource strain: Not on file  . Food insecurity    Worry: Not on file    Inability: Not on file  . Transportation needs    Medical: Not on file    Non-medical: Not on file  Tobacco Use  . Smoking status: Never Smoker  . Smokeless tobacco: Never Used  Substance and Sexual Activity  . Alcohol use: No    Comment: social  . Drug use: No  .  Sexual activity: Never  Lifestyle  . Physical activity    Days per week: Not on file    Minutes per session: Not on file  . Stress: Not on file  Relationships  . Social Musician on phone: Not on file    Gets together: Not on file    Attends religious service: Not on file    Active member of club or organization: Not on file    Attends meetings of clubs or organizations: Not on file    Relationship status: Not on file  Other Topics Concern  . Not on file  Social History Narrative  . Not on file    Allergies:  Allergies  Allergen Reactions  . Lisinopril Cough    Deep cough  . Amoxicillin Rash    Has patient had a PCN reaction causing immediate rash, facial/tongue/throat swelling, SOB or lightheadedness with hypotension:unsure Has patient had a PCN reaction causing severe rash involving mucus membranes or skin necrosis:unsure Has  patient had a PCN reaction that required hospitalization:NO  . Has patient had a PCN reaction occurring within the last 10 years:    Yes If all of the above answers are "NO", then may proceed with Cephalosporin use.   . Cephalosporins Other (See Comments)    Flushing of the face    Metabolic Disorder Labs: No results found for: HGBA1C, MPG No results found for: PROLACTIN No results found for: CHOL, TRIG, HDL, CHOLHDL, VLDL, LDLCALC No results found for: TSH  Therapeutic Level Labs: No results found for: LITHIUM No results found for: VALPROATE No components found for:  CBMZ  Current Medications: Current Outpatient Medications  Medication Sig Dispense Refill  . ALPRAZolam (XANAX) 0.5 MG tablet Take 1 tablet (0.5 mg total) by mouth 2 (two) times daily. 60 tablet 5  . amitriptyline (ELAVIL) 25 MG tablet Take 3 tablets (75 mg total) by mouth at bedtime. 270 tablet 1  . Artificial Tear Ointment (SOOTHE NIGHT TIME) OINT Place 1 application into both eyes at bedtime.    Marland Kitchen atorvastatin (LIPITOR) 20 MG tablet Take 20 mg by mouth every Wednesday.     . Biotin w/ Vitamins C & E (HAIR SKIN & NAILS GUMMIES PO) Take 2 tablets by mouth daily.    Marland Kitchen CALCIUM-VITAMIN D-VITAMIN K PO Take 1 tablet by mouth daily with lunch.    . Cholecalciferol (VITAMIN D) 2000 UNITS CAPS Take 2,000 Units by mouth daily.    . clobetasol ointment (TEMOVATE) 0.05 % Apply 1 application topically 2 (two) times daily as needed (for rash).    . COD LIVER OIL PO Take 5 mLs by mouth daily.    Marland Kitchen docusate sodium (RA COL-RITE) 100 MG capsule Take 100 mg by mouth 2 (two) times daily.    Marland Kitchen escitalopram (LEXAPRO) 20 MG tablet Take 1 tablet (20 mg total) by mouth daily. 90 tablet 1  . folic acid (FOLVITE) 1 MG tablet Take 1 mg by mouth daily.    . hydrocortisone 2.5 % cream Apply 1 application topically 2 (two) times daily as needed (for skin irritation/rash).     Marland Kitchen losartan (COZAAR) 100 MG tablet Take 100 mg by mouth daily.    .  Magnesium 250 MG TABS Take 250 mg by mouth daily.    . methotrexate (RHEUMATREX) 2.5 MG tablet Take by mouth.    Marland Kitchen omeprazole (PRILOSEC OTC) 20 MG tablet Take 20 mg by mouth daily before breakfast.    . Propylene Glycol (SYSTANE BALANCE)  0.6 % SOLN Place 1 drop into both eyes 4 (four) times daily as needed (for dry/irritated eyes (scheduled EVERY MORNING)).    Marland Kitchen zolpidem (AMBIEN) 5 MG tablet Take 1 tablet (5 mg total) by mouth at bedtime as needed for sleep. 30 tablet 0   No current facility-administered medications for this visit.      Musculoskeletal: Strength & Muscle Tone: within normal limits Gait & Station: normal Patient leans: N/A  Psychiatric Specialty Exam: ROS  There were no vitals taken for this visit.There is no height or weight on file to calculate BMI.  General Appearance: Casual and Fairly Groomed  Eye Contact:  Fair  Speech:  Clear and Coherent and Normal Rate  Volume:  Normal  Mood:  Anxious and Dysphoric  Affect:  Appropriate and Congruent  Thought Process:  Goal Directed and Descriptions of Associations: Intact  Orientation:  Full (Time, Place, and Person)  Thought Content: Logical   Suicidal Thoughts:  No  Homicidal Thoughts:  No  Memory:  Immediate;   Fair  Judgement:  Intact  Insight:  Shallow  Psychomotor Activity:  Normal  Concentration:  Attention Span: Fair  Recall:  [employer] of Knowledge: Fair  Language: Good  Akathisia:  Negative  Handed:  Right  AIMS (if indicated): not done  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  ADL's:  Intact  Cognition: WNL  Sleep:  Good   Screenings:   Assessment and Plan:    This patient's first problem is that of major depression.  It seems to be in remission taking Lexapro 20 mg.  Patient also takes Xanax 0.5 mg twice daily which seems to work well for her.  Presently the patient is in therapy.  The patient will return to see me in 5  months. 1. Severe episode of recurrent major depressive disorder, without psychotic features ([facility])     Status of current problems: unchanged  Labs Ordered: No orders of the defined types were placed in this encounter.   Labs Reviewed: NA  Collateral Obtained/Records Reviewed: NA  Plan:  Continue Xanax, Lexapro, Elavil as prescribed RTC 3 MONTHS   Gypsy Balsam, MD 06/23/2018, 11:41 AM

## 2018-07-13 ENCOUNTER — Ambulatory Visit (INDEPENDENT_AMBULATORY_CARE_PROVIDER_SITE_OTHER): Payer: Medicare HMO | Admitting: Psychiatry

## 2018-07-13 ENCOUNTER — Encounter (HOSPITAL_COMMUNITY): Payer: Self-pay | Admitting: Psychiatry

## 2018-07-13 ENCOUNTER — Other Ambulatory Visit: Payer: Self-pay

## 2018-07-13 DIAGNOSIS — F4312 Post-traumatic stress disorder, chronic: Secondary | ICD-10-CM

## 2018-07-13 NOTE — Progress Notes (Signed)
Virtual Visit via Video Note  I connected with Earnest Rosier on 07/13/18 at  2:00 PM EDT by a video enabled telemedicine application and verified that I am speaking with the correct person using two identifiers.  Location: Patient: Carrie Austin Provider: Lise Auer, LCSW   I discussed the limitations of evaluation and management by telemedicine and the availability of in person appointments. The patient expressed understanding and agreed to proceed.  History of Present Illness: Chronic PTSD and MDD   Observations/Objective: Counselor met with [name] for individual therapy via Webex. Counselor assessed MH symptoms and progress on treatment plan goals. Odalis denied suicidal ideation or self-harm behaviors. Saide shared that overall she has been maintaining well considering the circumstances of the stay at home orders. She endorsed having 2 "bad" days a week, where she describes feeling lonely and mild depression. Patrcia is connecting with friends and family, organizations she is a part of, reading books, cooking and cleaning, and watching shows to help her pass the time and reduce her depressive symptoms. Counselor assessed safety and dynamics within her close relationships. Counselor praised [place] on her efforts to ensure safety and staying connected. Counselor will send Laniece resources on alternative ways to combat depressive symptoms during lock down.   Assessment and Plan: Counselor will continue to meet with [name] to address treatment plan goals. Louana will continue to follow recommendations of providers and implement skills learned in session.  Follow Up Instructions: Counselor will send information for next session via Webex.     I discussed the assessment and treatment plan with the patient. The patient was provided an opportunity to ask questions and all were answered. The patient agreed with the plan and demonstrated an understanding of the instructions.   The patient was  advised to call back or seek an in-person evaluation if the symptoms worsen or if the condition fails to improve as anticipated.  I provided 50 minutes of non-face-to-face time during this encounter.   Lise Auer, LCSW

## 2018-07-27 ENCOUNTER — Ambulatory Visit (INDEPENDENT_AMBULATORY_CARE_PROVIDER_SITE_OTHER): Payer: Medicare HMO | Admitting: Psychiatry

## 2018-07-27 ENCOUNTER — Other Ambulatory Visit: Payer: Self-pay

## 2018-07-27 ENCOUNTER — Encounter (HOSPITAL_COMMUNITY): Payer: Self-pay | Admitting: Psychiatry

## 2018-07-27 DIAGNOSIS — F4312 Post-traumatic stress disorder, chronic: Secondary | ICD-10-CM

## 2018-07-27 DIAGNOSIS — F325 Major depressive disorder, single episode, in full remission: Secondary | ICD-10-CM

## 2018-07-27 NOTE — Progress Notes (Signed)
Virtual Visit via Video Note  I connected with Earnest Rosier on 07/27/18 at  2:00 PM EDT by a video enabled telemedicine application and verified that I am speaking with the correct person using two identifiers.  Location: Patient: Carrie Austin Provider: Lise Auer, LCSW   I discussed the limitations of evaluation and management by telemedicine and the availability of in person appointments. The patient expressed understanding and agreed to proceed.  History of Present Illness: Chronic PTSD and Major Depression in remission   Observations/Objective: Counselor met with [name] for individual therapy via Webex. Counselor assessed MH symptoms and progress on treatment plan goals. Deloros denied suicidal ideation or self-harm behaviors. Tashonda shared that it was her birthday over the weekend and that it was one of the worst she'd ever had. Counselor explored this expression of disappointment with Coraline as she shared about health concerns, loneliness, having to isolate, and car troubles. Counselor reviewed depression coping skill resources that were sent for her to review and apply for homework. Counselor and Vanassa were able to identify several ways she can combat feelings of loneliness and depression. Daysia expressed frustrations with COVID-19 and pushback from friends about the Anti-Racism movement in our [place]. We explored her frustrations more and validated her concerns.   Assessment and Plan: Counselor will continue to meet with [name] to address treatment plan goals. Chesnie will continue to follow recommendations of providers and implement skills learned in session.  Follow Up Instructions: Counselor will send information for next session via Webex.     I discussed the assessment and treatment plan with the patient. The patient was provided an opportunity to ask questions and all were answered. The patient agreed with the plan and demonstrated an understanding of the instructions.   The  patient was advised to call back or seek an in-person evaluation if the symptoms worsen or if the condition fails to improve as anticipated.  I provided 55 minutes of non-face-to-face time during this encounter.   Lise Auer, LCSW

## 2018-07-28 NOTE — Addendum Note (Signed)
Addended by: Lise Auer on: 07/28/2018 10:07 AM   Modules accepted: Level of Service

## 2018-08-17 ENCOUNTER — Ambulatory Visit (HOSPITAL_COMMUNITY): Payer: Medicare HMO | Admitting: Psychiatry

## 2018-08-31 ENCOUNTER — Other Ambulatory Visit: Payer: Self-pay

## 2018-08-31 ENCOUNTER — Ambulatory Visit (INDEPENDENT_AMBULATORY_CARE_PROVIDER_SITE_OTHER): Payer: Medicare HMO | Admitting: Psychiatry

## 2018-08-31 ENCOUNTER — Encounter (HOSPITAL_COMMUNITY): Payer: Self-pay | Admitting: Psychiatry

## 2018-08-31 DIAGNOSIS — F325 Major depressive disorder, single episode, in full remission: Secondary | ICD-10-CM | POA: Diagnosis not present

## 2018-08-31 DIAGNOSIS — F4312 Post-traumatic stress disorder, chronic: Secondary | ICD-10-CM | POA: Diagnosis not present

## 2018-08-31 NOTE — Progress Notes (Signed)
Virtual Visit via Video Note  I connected with Earnest Rosier on 08/31/18 at  2:00 PM EDT by a video enabled telemedicine application and verified that I am speaking with the correct person using two identifiers.  Location: Patient: Carrie Austin Provider: Lise Auer, LCSW   I discussed the limitations of evaluation and management by telemedicine and the availability of in person appointments. The patient expressed understanding and agreed to proceed.  History of Present Illness: MDD and PTSD  Observations/Objective: Counselor met with Carrie Austin for individual therapy via Webex. Counselor assessed MH symptoms and progress on treatment plan goals. Carrie Austin denied suicidal ideation or self-harm behaviors. Carrie Austin shared that her number one stressor is church. Counselor and Carrie Austin explored how dynamics at church are impacting her and potential solutions to address the problems. Carrie Austin shared that her depression comes and goes and is mainly related to the COVID restrictions, naming that she has COVID fatigue. Carrie Austin shared about interactions and dynamics with family and friends and organizations she is connected with. Counselor and Carrie Austin spent time discussing ways she can combat her depression. Carrie Austin shared about issues with her medications, but reported that she has been sleeping better over the past few nights.   Assessment and Plan: Counselor will continue to meet with patient to address treatment plan goals. Patient will continue to follow recommendations of providers and implement skills learned in session.  Follow Up Instructions: Counselor will send information for next session via Webex.   I discussed the assessment and treatment plan with the patient. The patient was provided an opportunity to ask questions and all were answered. The patient agreed with the plan and demonstrated an understanding of the instructions.   The patient was advised to call back or seek an in-person evaluation if the  symptoms worsen or if the condition fails to improve as anticipated.  I provided 57 minutes of non-face-to-face time during this encounter.   Lise Auer, LCSW

## 2018-09-13 ENCOUNTER — Other Ambulatory Visit (HOSPITAL_COMMUNITY): Payer: Self-pay

## 2018-09-13 MED ORDER — ZOLPIDEM TARTRATE 5 MG PO TABS: 5.0000 mg | ORAL_TABLET | Freq: Every evening | ORAL | 0 refills | Status: DC | PRN

## 2018-10-02 ENCOUNTER — Other Ambulatory Visit: Payer: Self-pay

## 2018-10-02 ENCOUNTER — Encounter (HOSPITAL_COMMUNITY): Payer: Self-pay | Admitting: Psychiatry

## 2018-10-02 ENCOUNTER — Ambulatory Visit (INDEPENDENT_AMBULATORY_CARE_PROVIDER_SITE_OTHER): Payer: Medicare HMO | Admitting: Psychiatry

## 2018-10-02 DIAGNOSIS — F4312 Post-traumatic stress disorder, chronic: Secondary | ICD-10-CM | POA: Diagnosis not present

## 2018-10-02 DIAGNOSIS — F325 Major depressive disorder, single episode, in full remission: Secondary | ICD-10-CM

## 2018-10-02 NOTE — Progress Notes (Signed)
Virtual Visit via Video Note  I connected with Carrie Austin on 10/02/18 at  2:00 PM EDT by a video enabled telemedicine application and verified that I am speaking with the correct person using two identifiers.  Location: Patient: Carrie Austin Provider: Lise Auer, LCSW   I discussed the limitations of evaluation and management by telemedicine and the availability of in person appointments. The patient expressed understanding and agreed to proceed.  History of Present Illness: PTSD   Observations/Objective: Counselor met with Carrie Austin for individual therapy via Webex. Counselor assessed MH symptoms and progress on treatment plan goals. Carrie Austin denied suicidal ideation or self-harm behaviors. Carrie Austin shared that she is maintaining over the continued "stay at home orders." Counselor assessed daily functioning with Carrie Austin reporting no concerns. Carrie Austin shared about a variety of ways in which she is becoming more assertive and how she is addressing issues with others in her life. Counselor praised Carrie Austin for her progress in these areas and explored new drive and abilities. Counselor and Carrie Austin discussed PTSD symptoms and triggers related to her late husband's criminal charges, passing, tarnished legacy and the impact its had on her and her relationships post-passing of her husband. Carrie Austin wanted more information on how to address and heal wounds with grandchildren. Counselor provided strategies to consider.    Assessment and Plan: Counselor will continue to meet with patient to address treatment plan goals. Patient will continue to follow recommendations of providers and implement skills learned in session.  Follow Up Instructions: Counselor will send information for next session via Webex.     I discussed the assessment and treatment plan with the patient. The patient was provided an opportunity to ask questions and all were answered. The patient agreed with the plan and demonstrated an  understanding of the instructions.   The patient was advised to call back or seek an in-person evaluation if the symptoms worsen or if the condition fails to improve as anticipated.  I provided 55 minutes of non-face-to-face time during this encounter.   Lise Auer, LCSW

## 2018-10-17 ENCOUNTER — Ambulatory Visit (HOSPITAL_COMMUNITY): Payer: Medicare HMO | Admitting: Psychiatry

## 2018-10-31 ENCOUNTER — Encounter (HOSPITAL_COMMUNITY): Payer: Self-pay | Admitting: Psychiatry

## 2018-10-31 ENCOUNTER — Ambulatory Visit (INDEPENDENT_AMBULATORY_CARE_PROVIDER_SITE_OTHER): Payer: Medicare HMO | Admitting: Psychiatry

## 2018-10-31 ENCOUNTER — Other Ambulatory Visit: Payer: Self-pay

## 2018-10-31 DIAGNOSIS — F325 Major depressive disorder, single episode, in full remission: Secondary | ICD-10-CM | POA: Diagnosis not present

## 2018-10-31 DIAGNOSIS — F4312 Post-traumatic stress disorder, chronic: Secondary | ICD-10-CM

## 2018-10-31 NOTE — Progress Notes (Signed)
Virtual Visit via Video Note  I connected with Carrie Austin on 10/31/18 at  2:00 PM EDT by a video enabled telemedicine application and verified that I am speaking with the correct person using two identifiers.  Location: Patient: Carrie Austin Provider: Bethany Morris, LCSW   I discussed the limitations of evaluation and management by telemedicine and the availability of in person appointments. The patient expressed understanding and agreed to proceed.  History of Present Illness: Chronic PTSD   Observations/Objective: Counselor met with Carrie Austin for individual therapy via Webex. Counselor assessed MH symptoms and progress on treatment plan goals. Carrie Austin presented with moderate depression and mild anxiety. Carrie Austin denied suicidal ideation or self-harm behaviors. Carrie Austin shared that she was becoming so depressed with only staying within her home for the past several months, that she planned a 5 day trip to the beach earlier in the month. Counselor explored her experience and process of deciding to use that as a coping mechanism. Carrie Austin shared that she experienced a variety of emotions and was able to have emotional releases on her own reflecting on the beach daily. Counselor and Carrie Austin explored grief and losses related to past relationships that she is mourning, discussing the concept of compounded grief. Carrie Austin became teary in session and was able to label and emote her feelings. Counselor and Carrie Austin identified a variety of coping strategies she can implement over the coming weeks to continue processing grief and loss, as well as heal and cope. Carrie Austin is willing to try the strategies to determine if they are helpful.   Assessment and Plan: Counselor will continue to meet with patient to address treatment plan goals. Patient will continue to follow recommendations of providers and implement skills learned in session.  Follow Up Instructions: Counselor will send information for next session via Webex.      I discussed the assessment and treatment plan with the patient. The patient was provided an opportunity to ask questions and all were answered. The patient agreed with the plan and demonstrated an understanding of the instructions.   The patient was advised to call back or seek an in-person evaluation if the symptoms worsen or if the condition fails to improve as anticipated.  I provided 55 minutes of non-face-to-face time during this encounter.   Bethany Morris, LCSW  

## 2018-11-16 ENCOUNTER — Ambulatory Visit (HOSPITAL_COMMUNITY): Payer: Medicare HMO | Admitting: Psychiatry

## 2018-11-16 ENCOUNTER — Other Ambulatory Visit: Payer: Self-pay

## 2018-11-21 ENCOUNTER — Ambulatory Visit (INDEPENDENT_AMBULATORY_CARE_PROVIDER_SITE_OTHER): Payer: Medicare HMO | Admitting: Psychiatry

## 2018-11-21 ENCOUNTER — Other Ambulatory Visit: Payer: Self-pay

## 2018-11-21 ENCOUNTER — Encounter (HOSPITAL_COMMUNITY): Payer: Self-pay | Admitting: Psychiatry

## 2018-11-21 DIAGNOSIS — F4312 Post-traumatic stress disorder, chronic: Secondary | ICD-10-CM | POA: Diagnosis not present

## 2018-11-21 DIAGNOSIS — F325 Major depressive disorder, single episode, in full remission: Secondary | ICD-10-CM

## 2018-11-21 NOTE — Progress Notes (Signed)
Virtual Visit via Video Note  I connected with Connye Burkitt Hawkes on 11/21/18 at  1:00 PM EST by a video enabled telemedicine application and verified that I am speaking with the correct person using two identifiers.  Location: Patient: Carrie Austin Provider: Lise Auer, LCSW     I discussed the limitations of evaluation and management by telemedicine and the availability of in person appointments. The patient expressed understanding and agreed to proceed.  History of Present Illness: MDD and PTSD   Observations/Objective: Counselor met with [name] for individual therapy via Webex. Counselor assessed MH symptoms and progress on treatment plan goals. Jamina presented with moderate depression and mild anxiety. Valerya denied suicidal ideation or self-harm behaviors. Counselor explored her social connections and assessed daily functioning. Ladora shared about 3 outings she engaged in recently where she was able to manage emotions and enjoyed her time with peers and extended family. Counselor prompted Aubrynn to identify positive self-attributes and coping skills. Lorece struggled in this area and needs the reassurance from others. Counselor processed concerns and updates she had in other relationships. Neaveh was able to connect her depression with the outcome of communications with others. Counselor gave [name] homework to do between session for progress in these areas and relationships.   Assessment and Plan: Counselor will continue to meet with patient to address treatment plan goals. Patient will continue to follow recommendations of providers and implement skills learned in session.  Follow Up Instructions: Counselor will send information for next session via Webex.     I discussed the assessment and treatment plan with the patient. The patient was provided an opportunity to ask questions and all were answered. The patient agreed with the plan and demonstrated an understanding of the  instructions.   The patient was advised to call back or seek an in-person evaluation if the symptoms worsen or if the condition fails to improve as anticipated.  I provided 55 minutes of non-face-to-face time during this encounter.   Lise Auer, LCSW

## 2018-11-24 ENCOUNTER — Ambulatory Visit (HOSPITAL_COMMUNITY): Payer: Medicare HMO | Admitting: Psychiatry

## 2018-12-01 ENCOUNTER — Ambulatory Visit (HOSPITAL_COMMUNITY): Payer: Medicare HMO | Admitting: Psychiatry

## 2018-12-12 ENCOUNTER — Other Ambulatory Visit: Payer: Self-pay

## 2018-12-12 ENCOUNTER — Encounter (HOSPITAL_COMMUNITY): Payer: Self-pay | Admitting: Psychiatry

## 2018-12-12 ENCOUNTER — Ambulatory Visit (INDEPENDENT_AMBULATORY_CARE_PROVIDER_SITE_OTHER): Payer: Medicare HMO | Admitting: Psychiatry

## 2018-12-12 DIAGNOSIS — F325 Major depressive disorder, single episode, in full remission: Secondary | ICD-10-CM

## 2018-12-12 DIAGNOSIS — F4312 Post-traumatic stress disorder, chronic: Secondary | ICD-10-CM | POA: Diagnosis not present

## 2018-12-12 NOTE — Progress Notes (Signed)
Virtual Visit via Video Note  I connected with Carrie Austin on 12/12/18 at  1:00 PM EST by a video enabled telemedicine application and verified that I am speaking with the correct person using two identifiers.  Location: Patient: Carrie Austin Provider: Lise Auer, LCSW   I discussed the limitations of evaluation and management by telemedicine and the availability of in person appointments. The patient expressed understanding and agreed to proceed.  History of Present Illness: MDD and PTSD   Observations/Objective: Counselor met with Carrie Austin for individual therapy via Webex. Counselor assessed MH symptoms and progress on treatment plan goals. Carrie Austin presents with moderate depression and mild anxiety. Carrie Austin denied suicidal ideation or self-harm behaviors. Carrie Austin shared about her time with family over the holiday week. Counselor assessed daily functioning. Carrie Austin remains connected with friends, family and organizations she volunteers with. She reports continual depressive symptoms and attempts to apply coping strategies. Counselor recommended participation in [place] and Classes with [employer] to combat depression. Counselor processed grief and loss associated with recent failed intimate relationships. 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 Counselor and Joslynne listed pros and cons to making the decision.   Assessment and Plan: Counselor will continue to meet with patient to address treatment plan goals. Patient will continue to follow recommendations of providers and implement skills learned in session.  Follow Up Instructions: Counselor will send information for next session via Webex.     I discussed the assessment and treatment plan with the patient. The patient was provided an opportunity to ask questions and all were answered. The patient agreed with the plan and demonstrated  an understanding of the instructions.   The patient was advised to call back or seek an in-person evaluation if the symptoms worsen or if the condition fails to improve as anticipated.  I provided 50 minutes of non-face-to-face time during this encounter.   Lise Auer, LCSW

## 2018-12-14 ENCOUNTER — Ambulatory Visit (HOSPITAL_COMMUNITY): Payer: Medicare HMO | Admitting: Psychiatry

## 2018-12-15 ENCOUNTER — Other Ambulatory Visit (HOSPITAL_COMMUNITY): Payer: Self-pay | Admitting: Psychiatry

## 2018-12-15 DIAGNOSIS — F332 Major depressive disorder, recurrent severe without psychotic features: Secondary | ICD-10-CM

## 2019-01-02 ENCOUNTER — Ambulatory Visit (HOSPITAL_COMMUNITY): Payer: Medicare HMO | Admitting: Psychiatry

## 2019-01-09 ENCOUNTER — Other Ambulatory Visit: Payer: Self-pay

## 2019-01-09 ENCOUNTER — Ambulatory Visit (INDEPENDENT_AMBULATORY_CARE_PROVIDER_SITE_OTHER): Payer: Medicare HMO | Admitting: Psychiatry

## 2019-01-09 DIAGNOSIS — F325 Major depressive disorder, single episode, in full remission: Secondary | ICD-10-CM

## 2019-01-09 DIAGNOSIS — F332 Major depressive disorder, recurrent severe without psychotic features: Secondary | ICD-10-CM

## 2019-01-09 MED ORDER — ALPRAZOLAM 0.5 MG PO TABS: 0.5000 mg | ORAL_TABLET | Freq: Two times a day (BID) | ORAL | 5 refills | Status: DC

## 2019-01-09 MED ORDER — ESCITALOPRAM OXALATE 20 MG PO TABS: 20.0000 mg | ORAL_TABLET | Freq: Every day | ORAL | 1 refills | Status: DC

## 2019-01-09 NOTE — Progress Notes (Signed)
BH MD/PA/NP OP Progress Note  01/09/2019 3:35 PM Carrie Austin  MRN:  [id number]  Chief Complaint: Major depression in remission  HPI:   Today the patient is doing well.  She is doing well in therapy.  She takes her medicines as prescribed.  She also takes B12.  She likes to read watch TV and does walk.  The patient recently purchased a car.  The patient is energy level is good.  She denies any psychosis.  She denies any use of alcohol or drugs.  Patient is healthy.  Financially she is stable.  She does describe some degree of loneliness.  She recently reconnected to an old boyfriend but that does not look like it is going to go very far.  Patient is positive and optimistic.  She is very engaging.  She takes her medicines just as prescribed and has no problems with them.  She denies being fatigued.  She is functioning very well.  She denies chest pain shortness of breath or any neurological symptoms. Visit Diagnosis:    ICD-10-CM   1. Severe episode of recurrent major depressive disorder, without psychotic features (HCC)  F33.2 ALPRAZolam (XANAX) 0.5 MG tablet    escitalopram (LEXAPRO) 20 MG tablet    Past Psychiatric History: See intake H&P for full details. Reviewed, with no updates at this time.   Past Medical History:  Past Medical History:  Diagnosis Date  . Allergy   . Anxiety   . Arthritis   . Cough    due allergies chronic  . Depression   . Dilatation of esophagus   . Fibromyalgia   . GERD (gastroesophageal reflux disease)   . Headache(784.0)   . Heart murmur    one time  . Hypertension   . PONV (postoperative nausea and vomiting)    hx of Vertigo after anesthesia    Past Surgical History:  Procedure Laterality Date  . ABDOMINAL HYSTERECTOMY    . BACK SURGERY  12/21/12  . CHOLECYSTECTOMY    . COLONOSCOPY    . EYE SURGERY Bilateral    lasik    Family Psychiatric History: See intake H&P for full details. Reviewed, with no updates at this time.   Family  History: No family history on file.  Social History:  Social History   Socioeconomic History  . Marital status: Widowed    Spouse name: Not on file  . Number of children: Not on file  . Years of education: Not on file  . Highest education level: Not on file  Occupational History  . Not on file  Tobacco Use  . Smoking status: Never Smoker  . Smokeless tobacco: Never Used  Substance and Sexual Activity  . Alcohol use: No    Comment: social  . Drug use: No  . Sexual activity: Never  Other Topics Concern  . Not on file  Social History Narrative  . Not on file   Social Determinants of Health   Financial Resource Strain:   . Difficulty of Paying Living Expenses: Not on file  Food Insecurity:   . Worried About Programme researcher, broadcasting/film/video in the Last Year: Not on file  . Ran Out of Food in the Last Year: Not on file  Transportation Needs:   . Lack of Transportation (Medical): Not on file  . Lack of Transportation (Non-Medical): Not on file  Physical Activity:   . Days of Exercise per Week: Not on file  . Minutes of Exercise per Session: Not on file  Stress:   . Feeling of Stress : Not on file  Social Connections:   . Frequency of Communication with Friends and Family: Not on file  . Frequency of Social Gatherings with Friends and Family: Not on file  . Attends Religious Services: Not on file  . Active Member of Clubs or Organizations: Not on file  . Attends Banker Meetings: Not on file  . Marital Status: Not on file    Allergies:  Allergies  Allergen Reactions  . Lisinopril Cough    Deep cough  . Amoxicillin Rash    Has patient had a PCN reaction causing immediate rash, facial/tongue/throat swelling, SOB or lightheadedness with hypotension:unsure Has patient had a PCN reaction causing severe rash involving mucus membranes or skin necrosis:unsure Has patient had a PCN reaction that required hospitalization:NO  . Has patient had a PCN reaction occurring within  the last 10 years:    Yes If all of the above answers are "NO", then may proceed with Cephalosporin use.   . Cephalosporins Other (See Comments)    Flushing of the face    Metabolic Disorder Labs: No results found for: HGBA1C, MPG No results found for: PROLACTIN No results found for: CHOL, TRIG, HDL, CHOLHDL, VLDL, LDLCALC No results found for: TSH  Therapeutic Level Labs: No results found for: LITHIUM No results found for: VALPROATE No components found for:  CBMZ  Current Medications: Current Outpatient Medications  Medication Sig Dispense Refill  . ALPRAZolam (XANAX) 0.5 MG tablet Take 1 tablet (0.5 mg total) by mouth 2 (two) times daily. 60 tablet 5  . amitriptyline (ELAVIL) 25 MG tablet Take 3 tablets (75 mg total) by mouth at bedtime. 270 tablet 1  . Artificial Tear Ointment (SOOTHE NIGHT TIME) OINT Place 1 application into both eyes at bedtime.    Marland Kitchen atorvastatin (LIPITOR) 20 MG tablet Take 20 mg by mouth every Wednesday.     . Biotin w/ Vitamins C & E (HAIR SKIN & NAILS GUMMIES PO) Take 2 tablets by mouth daily.    Marland Kitchen CALCIUM-VITAMIN D-VITAMIN K PO Take 1 tablet by mouth daily with lunch.    . Cholecalciferol (VITAMIN D) 2000 UNITS CAPS Take 2,000 Units by mouth daily.    . clobetasol ointment (TEMOVATE) 0.05 % Apply 1 application topically 2 (two) times daily as needed (for rash).    . COD LIVER OIL PO Take 5 mLs by mouth daily.    Marland Kitchen docusate sodium (RA COL-RITE) 100 MG capsule Take 100 mg by mouth 2 (two) times daily.    Marland Kitchen escitalopram (LEXAPRO) 20 MG tablet Take 1 tablet (20 mg total) by mouth daily. 90 tablet 1  . folic acid (FOLVITE) 1 MG tablet Take 1 mg by mouth daily.    . hydrocortisone 2.5 % cream Apply 1 application topically 2 (two) times daily as needed (for skin irritation/rash).     Marland Kitchen losartan (COZAAR) 100 MG tablet Take 100 mg by mouth daily.    . Magnesium 250 MG TABS Take 250 mg by mouth daily.    . methotrexate (RHEUMATREX) 2.5 MG tablet Take by mouth.     Marland Kitchen omeprazole (PRILOSEC OTC) 20 MG tablet Take 20 mg by mouth daily before breakfast.    . Propylene Glycol (SYSTANE BALANCE) 0.6 % SOLN Place 1 drop into both eyes 4 (four) times daily as needed (for dry/irritated eyes (scheduled EVERY MORNING)).    Marland Kitchen zolpidem (AMBIEN) 5 MG tablet Take 1 tablet (5 mg total) by mouth at  bedtime as needed for sleep. 30 tablet 0   No current facility-administered medications for this visit.     Musculoskeletal: Strength & Muscle Tone: within normal limits Gait & Station: normal Patient leans: N/A  Psychiatric Specialty Exam: ROS  There were no vitals taken for this visit.There is no height or weight on file to calculate BMI.  General Appearance: Casual and Fairly Groomed  Eye Contact:  Fair  Speech:  Clear and Coherent and Normal Rate  Volume:  Normal  Mood:  Anxious and Dysphoric  Affect:  Appropriate and Congruent  Thought Process:  Goal Directed and Descriptions of Associations: Intact  Orientation:  Full (Time, Place, and Person)  Thought Content: Logical   Suicidal Thoughts:  No  Homicidal Thoughts:  No  Memory:  Immediate;   Fair  Judgement:  Intact  Insight:  Shallow  Psychomotor Activity:  Normal  Concentration:  Attention Span: Fair  Recall:  [employer] of Knowledge: Fair  Language: Good  Akathisia:  Negative  Handed:  Right  AIMS (if indicated): not done  Assets:  Agricultural consultant Housing Intimacy Social Support Transportation  ADL's:  Intact  Cognition: WNL  Sleep:  Good   Screenings:   Assessment and Plan:    At this time the patient is doing very well.  She benefits greatly by being in therapy.  Patient is #1 problem is major depression.  She takes Lexapro 20 mg.  She denies any physical complaints.  She takes Xanax 0.5 mg 2 a day.  Should continue taking both of these medications and return to see me in 5 months.  Should continue in therapy. 1. Severe episode of recurrent major  depressive disorder, without psychotic features ([place])     Status of current problems: unchanged  Labs Ordered: No orders of the defined types were placed in this encounter.   Labs Reviewed: NA  Collateral Obtained/Records Reviewed: NA  Plan:  Continue Xanax, Lexapro, Elavil as prescribed [facility] [place], MD 01/09/2019, 3:35 PM

## 2019-01-31 ENCOUNTER — Ambulatory Visit (INDEPENDENT_AMBULATORY_CARE_PROVIDER_SITE_OTHER): Payer: Medicare HMO | Admitting: Psychiatry

## 2019-01-31 ENCOUNTER — Other Ambulatory Visit: Payer: Self-pay

## 2019-01-31 DIAGNOSIS — F339 Major depressive disorder, recurrent, unspecified: Secondary | ICD-10-CM | POA: Diagnosis not present

## 2019-01-31 DIAGNOSIS — F4312 Post-traumatic stress disorder, chronic: Secondary | ICD-10-CM | POA: Diagnosis not present

## 2019-01-31 NOTE — Progress Notes (Signed)
Virtual Visit via Video Note  I connected with Carrie Austin on 01/31/19 at  3:00 PM EST by a video enabled telemedicine application and verified that I am speaking with the correct person using two identifiers.  Location: Patient: Patient Home Provider: Home Office   I discussed the limitations of evaluation and management by telemedicine and the availability of in person appointments. The patient expressed understanding and agreed to proceed.  History of Present Illness: MDD and PTSD   Observations/Objective: Counselor met with Carrie Austin for individual therapy via Webex. Counselor assessed MH symptoms and progress on treatment plan goals. Carrie Austin presents with mild depression and mild anxiety. Carrie Austin denied suicidal ideation or self-harm behaviors. Carrie Austin shared that she is experiencing relief from political turmoil and that she is now able to receive the COVID vaccine. Counselor processed thoughts and emotions regarding these two statements to measure impact on mental health. Counselor and Carrie Austin used CBT interventions to process grief and loss concerns related to loss of friendships/connection with organizations. Carrie Austin identified coping strategies in dealing with loneliness and isolation, by reading books, taking walks, purging and organizing cabinets, and contacting friends and family to communicate. Carrie Austin expressed concern about troubling dreams she has started having over over the past several weeks. Counselor shared psychoeducation on dreams/mental health and encouraged Carrie Austin to discuss with her provider to determine if it is medicine related.   Assessment and Plan: Counselor will continue to meet with patient to address treatment plan goals. Patient will continue to follow recommendations of providers and implement skills learned in session.  Follow Up Instructions: Counselor will send information for next session via Webex.     I discussed the assessment and treatment plan with the  patient. The patient was provided an opportunity to ask questions and all were answered. The patient agreed with the plan and demonstrated an understanding of the instructions.   The patient was advised to call back or seek an in-person evaluation if the symptoms worsen or if the condition fails to improve as anticipated.  I provided 55 minutes of non-face-to-face time during this encounter.   Lise Auer, LCSW

## 2019-02-01 ENCOUNTER — Encounter (HOSPITAL_COMMUNITY): Payer: Self-pay | Admitting: Psychiatry

## 2019-02-20 ENCOUNTER — Ambulatory Visit (INDEPENDENT_AMBULATORY_CARE_PROVIDER_SITE_OTHER): Payer: Medicare HMO | Admitting: Psychiatry

## 2019-02-20 ENCOUNTER — Encounter (HOSPITAL_COMMUNITY): Payer: Self-pay | Admitting: Psychiatry

## 2019-02-20 ENCOUNTER — Other Ambulatory Visit: Payer: Self-pay

## 2019-02-20 DIAGNOSIS — F339 Major depressive disorder, recurrent, unspecified: Secondary | ICD-10-CM | POA: Diagnosis not present

## 2019-02-20 DIAGNOSIS — F4312 Post-traumatic stress disorder, chronic: Secondary | ICD-10-CM | POA: Diagnosis not present

## 2019-02-20 NOTE — Progress Notes (Signed)
Virtual Visit via Video Note  I connected with Carrie Austin on 02/20/19 at  2:00 PM EST by a video enabled telemedicine application and verified that I am speaking with the correct person using two identifiers.  Location: Patient: Patient Home Provider: Home Office   I discussed the limitations of evaluation and management by telemedicine and the availability of in person appointments. The patient expressed understanding and agreed to proceed.  History of Present Illness: MDD and Chronic PTSD   Treatment Plan Goals: Improve self-image and worth through building up confidence to communicate more effectively and mastering her daily living tasks to become more organized through applying coping and communication skills/strategies learned in therapy.  Observations/Objective: Counselor met with Carrie Austin for individual therapy via Webex. Counselor assessed MH symptoms and progress on treatment plan goals, with patient reporting confidently advocating for herself and others and adding a daily walk for more daily structure. Carrie Austin presents with mild depression and mild anxiety. Carrie Austin denied suicidal ideation or self-harm behaviors.   Carrie Austin shared that she successfully scheduled her and others COVID Vaccine through persistence and assertive communication. Counselor praised Carrie Austin for application of skills and processed the impact of achieving this task on her mental health. Carrie Austin reported that she is proud of herself, feels more hopeful and optimistic about being able to return to daily routines, tasks and habits, without living in fear. Counselor noted that she continues to have unsettling dreams connected with past traumas and recently experienced her first pleasant dream. Counselor and Carrie Austin processed unresolved components of her past trauma related to her former husbands conviction, prison time and passing. Carrie Austin discussed how it continues to impact her self-image and relationship with others in the  family, which causes feelings of depression. Counselor provided psychoeducation on the impact of trauma within family dynamics and mental health. Carrie Austin noted marked improvements in these areas overtime as she addressed issues head on and came to the point of acceptance in her grief and loss.   In discussing discharge planning, today will be Carrie Austin's last session, as she believes she has achieved her treatment plan goals. She plans on getting engaged in two different volunteering opportunities and to become more socially engaged as the Carrie Austin decrease into the Spring. Counselor provided her with contact information for [facility] therapists, if she feels the need to get reengaged. She expressed interest in Carrie Austin services as well. She will continue to be followed for medication management within [name] system for management of depression and sleep issues.   Assessment and Plan: Counselor discharge Carrie Austin from outpatient therapy services effective today. Patient will continue to follow recommendations of providers and implement skills learned in sessions.  Follow Up Instructions: Counselor will send information on requested resources.    The patient was advised to call back or seek an in-person evaluation if the symptoms worsen or if the condition fails to improve as anticipated.  I provided 55 minutes of non-face-to-face time during this encounter.   Lise Auer, LCSW

## 2019-06-13 ENCOUNTER — Other Ambulatory Visit (HOSPITAL_COMMUNITY): Payer: Self-pay | Admitting: *Deleted

## 2019-06-13 ENCOUNTER — Ambulatory Visit (HOSPITAL_COMMUNITY): Payer: Medicare HMO | Admitting: Psychiatry

## 2019-06-13 ENCOUNTER — Other Ambulatory Visit: Payer: Self-pay

## 2019-06-13 DIAGNOSIS — F332 Major depressive disorder, recurrent severe without psychotic features: Secondary | ICD-10-CM

## 2019-06-13 MED ORDER — ALPRAZOLAM 0.5 MG PO TABS: 0.5000 mg | ORAL_TABLET | Freq: Two times a day (BID) | ORAL | 5 refills | Status: DC

## 2019-07-03 ENCOUNTER — Other Ambulatory Visit: Payer: Self-pay

## 2019-07-03 ENCOUNTER — Telehealth (INDEPENDENT_AMBULATORY_CARE_PROVIDER_SITE_OTHER): Payer: Medicare HMO | Admitting: Psychiatry

## 2019-07-03 DIAGNOSIS — F325 Major depressive disorder, single episode, in full remission: Secondary | ICD-10-CM

## 2019-07-03 DIAGNOSIS — F332 Major depressive disorder, recurrent severe without psychotic features: Secondary | ICD-10-CM

## 2019-07-03 MED ORDER — ZOLPIDEM TARTRATE 5 MG PO TABS: 5.0000 mg | ORAL_TABLET | Freq: Every evening | ORAL | 0 refills | Status: DC | PRN

## 2019-07-03 MED ORDER — ALPRAZOLAM 0.5 MG PO TABS: 0.5000 mg | ORAL_TABLET | Freq: Two times a day (BID) | ORAL | 5 refills | Status: DC

## 2019-07-03 MED ORDER — ESCITALOPRAM OXALATE 10 MG PO TABS: 20.0000 mg | ORAL_TABLET | Freq: Every day | ORAL | 5 refills | Status: DC

## 2019-07-03 NOTE — Progress Notes (Signed)
BH MD/PA/NP OP Progress Note  07/03/2019 2:46 PM Carrie Austin  MRN:  [id number]  Chief Complaint: Major depression in remission  HPI:   Emotionally the patient seems to be doing fairly well.  She has not been visits.  She went to [facility] for a week and a half and visit her sister.  She also went to good beach trip with some friends.  On the other hand she also has had a syncopal episode.  She is not sure if she ever hit her head and she does not know how long she was out other than perhaps more than a few minutes to an hour.  Patient woke up and found herself on the floor.  She did not urinate on herself or show any signs that she had a seizure.  She did not hurt her self.  Nonetheless she may contact with her doctor and is going to see her primary care doctor and a neurologist in the next week or so.  The patient denies being persistently depressed.  She is sleeping and eating well.  Her energy level is good.  She still enjoys life.  She does not drink any alcohol or use any drugs.  She has no evidence of psychosis.  The patient takes her medicines just as prescribed.  She rarely takes an Ambien but she does take Xanax 0.5 mg twice daily.  She also takes 20 mg of Lexapro.  The patient denies any chest pain or shortness of breath.  She denies any neurological symptoms at this time.  Her episode of syncope was the first time it ever happened to be thoroughly evaluated in the next week or so. Visit Diagnosis:    ICD-10-CM   1. Severe episode of recurrent major depressive disorder, without psychotic features ([place])  F33.2 ALPRAZolam (XANAX) 0.5 MG tablet    escitalopram (LEXAPRO) 10 MG tablet    DISCONTINUED: ALPRAZolam (XANAX) 0.5 MG tablet    Past Psychiatric History: See intake H&P for full details. Reviewed, with no updates at this time.   Past Medical History:  Past Medical History:  Diagnosis Date  . Allergy   . Anxiety   . Arthritis   . Cough    due allergies chronic  .  Depression   . Dilatation of esophagus   . Fibromyalgia   . GERD (gastroesophageal reflux disease)   . Headache(784.0)   . Heart murmur    one time  . Hypertension   . PONV (postoperative nausea and vomiting)    hx of Vertigo after anesthesia    Past Surgical History:  Procedure Laterality Date  . ABDOMINAL HYSTERECTOMY    . BACK SURGERY  12/21/12  . CHOLECYSTECTOMY    . COLONOSCOPY    . EYE SURGERY Bilateral    lasik    Family Psychiatric History: See intake H&P for full details. Reviewed, with no updates at this time.   Family History: No family history on file.  Social History:  Social History   Socioeconomic History  . Marital status: Widowed    Spouse name: Not on file  . Number of children: Not on file  . Years of education: Not on file  . Highest education level: Not on file  Occupational History  . Not on file  Tobacco Use  . Smoking status: Never Smoker  . Smokeless tobacco: Never Used  Vaping Use  . Vaping Use: Never used  Substance and Sexual Activity  . Alcohol use: No  Comment: social  . Drug use: No  . Sexual activity: Never  Other Topics Concern  . Not on file  Social History Narrative  . Not on file   Social Determinants of Health   Financial Resource Strain:   . Difficulty of Paying Living Expenses:   Food Insecurity:   . Worried About Programme researcher, broadcasting/film/video in the Last Year:   . Barista in the Last Year:   Transportation Needs:   . Freight forwarder (Medical):   Marland Kitchen Lack of Transportation (Non-Medical):   Physical Activity:   . Days of Exercise per Week:   . Minutes of Exercise per Session:   Stress:   . Feeling of Stress :   Social Connections:   . Frequency of Communication with Friends and Family:   . Frequency of Social Gatherings with Friends and Family:   . Attends Religious Services:   . Active Member of Clubs or Organizations:   . Attends Banker Meetings:   Marland Kitchen Marital Status:     Allergies:   Allergies  Allergen Reactions  . Lisinopril Cough    Deep cough  . Amoxicillin Rash    Has patient had a PCN reaction causing immediate rash, facial/tongue/throat swelling, SOB or lightheadedness with hypotension:unsure Has patient had a PCN reaction causing severe rash involving mucus membranes or skin necrosis:unsure Has patient had a PCN reaction that required hospitalization:NO  . Has patient had a PCN reaction occurring within the last 10 years:    Yes If all of the above answers are "NO", then may proceed with Cephalosporin use.   . Cephalosporins Other (See Comments)    Flushing of the face    Metabolic Disorder Labs: No results found for: HGBA1C, MPG No results found for: PROLACTIN No results found for: CHOL, TRIG, HDL, CHOLHDL, VLDL, LDLCALC No results found for: TSH  Therapeutic Level Labs: No results found for: LITHIUM No results found for: VALPROATE No components found for:  CBMZ  Current Medications: Current Outpatient Medications  Medication Sig Dispense Refill  . ALPRAZolam (XANAX) 0.5 MG tablet Take 1 tablet (0.5 mg total) by mouth 2 (two) times daily. 60 tablet 5  . amitriptyline (ELAVIL) 25 MG tablet Take 3 tablets (75 mg total) by mouth at bedtime. 270 tablet 1  . Artificial Tear Ointment (SOOTHE NIGHT TIME) OINT Place 1 application into both eyes at bedtime.    Marland Kitchen atorvastatin (LIPITOR) 20 MG tablet Take 20 mg by mouth every Wednesday.     . Biotin w/ Vitamins C & E (HAIR SKIN & NAILS GUMMIES PO) Take 2 tablets by mouth daily.    Marland Kitchen CALCIUM-VITAMIN D-VITAMIN K PO Take 1 tablet by mouth daily with lunch.    . Cholecalciferol (VITAMIN D) 2000 UNITS CAPS Take 2,000 Units by mouth daily.    . clobetasol ointment (TEMOVATE) 0.05 % Apply 1 application topically 2 (two) times daily as needed (for rash).    . COD LIVER OIL PO Take 5 mLs by mouth daily.    Marland Kitchen docusate sodium (RA COL-RITE) 100 MG capsule Take 100 mg by mouth 2 (two) times daily.    Marland Kitchen escitalopram  (LEXAPRO) 10 MG tablet Take 2 tablets (20 mg total) by mouth daily. 30 tablet 5  . folic acid (FOLVITE) 1 MG tablet Take 1 mg by mouth daily.    . hydrocortisone 2.5 % cream Apply 1 application topically 2 (two) times daily as needed (for skin irritation/rash).     Marland Kitchen  losartan (COZAAR) 100 MG tablet Take 100 mg by mouth daily.    . Magnesium 250 MG TABS Take 250 mg by mouth daily.    . methotrexate (RHEUMATREX) 2.5 MG tablet Take by mouth.    Marland Kitchen omeprazole (PRILOSEC OTC) 20 MG tablet Take 20 mg by mouth daily before breakfast.    . Propylene Glycol (SYSTANE BALANCE) 0.6 % SOLN Place 1 drop into both eyes 4 (four) times daily as needed (for dry/irritated eyes (scheduled EVERY MORNING)).    Marland Kitchen zolpidem (AMBIEN) 5 MG tablet Take 1 tablet (5 mg total) by mouth at bedtime as needed for sleep. 30 tablet 0   No current facility-administered medications for this visit.     Musculoskeletal: Strength & Muscle Tone: within normal limits Gait & Station: normal Patient leans: N/A  Psychiatric Specialty Exam: ROS  There were no vitals taken for this visit.There is no height or weight on file to calculate BMI.  General Appearance: Casual and Fairly Groomed  Eye Contact:  Fair  Speech:  Clear and Coherent and Normal Rate  Volume:  Normal  Mood:  Anxious and Dysphoric  Affect:  Appropriate and Congruent  Thought Process:  Goal Directed and Descriptions of Associations: Intact  Orientation:  Full (Time, Place, and Person)  Thought Content: Logical   Suicidal Thoughts:  No  Homicidal Thoughts:  No  Memory:  Immediate;   Fair  Judgement:  Intact  Insight:  Shallow  Psychomotor Activity:  Normal  Concentration:  Attention Span: Fair  Recall:  [employer] of Knowledge: Fair  Language: Good  Akathisia:  Negative  Handed:  Right  AIMS (if indicated): not done  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  ADL's:  Intact  Cognition:  WNL  Sleep:  Good   Screenings:   Assessment and Plan:    This patient's first problem is that of major depression.  For the most part this is in remission.  Today we will reduce her Lexapro from taking 20 mg to a newer lower dose of 10 mg that is now indicated for a person of her age.  She will continue taking Xanax 0.5 mg twice daily.  This will be for an adjustment disorder with an anxious mood state.  Her third problem is that of insomnia.  The patient rarely has this is a problem at this time.  She rarely takes any Ambien.  At this time she is chosen not to go back to psychotherapy.  She seems to be functioning fairly well.  She has a new car [employer] and she likes it a lot.  The patient's health overall seems to be stable.  This issue of the syncope will be evaluated.  It is noted patient does have a significant family history of coronary artery disease.  The patient smokes no cigarettes. 1. Severe episode of recurrent major depressive disorder, without psychotic features ([facility])     Status of current problems: unchanged  Labs Ordered: No orders of the defined types were placed in this encounter.   Labs Reviewed: NA  Collateral Obtained/Records Reviewed: NA  Plan:  Continue Xanax, Lexapro, Elavil as prescribed RTC 3 MONTHS   Gypsy Balsam, MD 07/03/2019, 2:45 PM

## 2019-07-09 DIAGNOSIS — R4020 Unspecified coma: Secondary | ICD-10-CM | POA: Insufficient documentation

## 2019-07-09 DIAGNOSIS — G43009 Migraine without aura, not intractable, without status migrainosus: Secondary | ICD-10-CM | POA: Insufficient documentation

## 2019-07-12 ENCOUNTER — Telehealth (HOSPITAL_COMMUNITY): Payer: Self-pay

## 2019-07-12 NOTE — Telephone Encounter (Signed)
[employer] PRESCRIPTION COVERAGE APPROVED  ESCITALOPRAM 10MG  TABLET  EFFECTIVE UNTIL 01/11/2020

## 2019-10-03 ENCOUNTER — Telehealth (INDEPENDENT_AMBULATORY_CARE_PROVIDER_SITE_OTHER): Payer: Medicare HMO | Admitting: Psychiatry

## 2019-10-03 ENCOUNTER — Other Ambulatory Visit: Payer: Self-pay

## 2019-10-03 DIAGNOSIS — F325 Major depressive disorder, single episode, in full remission: Secondary | ICD-10-CM

## 2019-10-03 DIAGNOSIS — F332 Major depressive disorder, recurrent severe without psychotic features: Secondary | ICD-10-CM

## 2019-10-03 MED ORDER — ALPRAZOLAM 0.5 MG PO TABS: 0.5000 mg | ORAL_TABLET | Freq: Two times a day (BID) | ORAL | 5 refills | Status: DC

## 2019-10-03 MED ORDER — ESCITALOPRAM OXALATE 10 MG PO TABS: 20.0000 mg | ORAL_TABLET | Freq: Every day | ORAL | 5 refills | Status: DC

## 2019-10-03 NOTE — Progress Notes (Signed)
BH MD/PA/NP OP Progress Note  10/03/2019 12:00 PM Carrie Austin  MRN:  [id number]  Chief Complaint: Major depression in remission  HPI:   The patient today is doing very well.  Her mood is stable.  She denies being depressed.  She is not had any more syncopal episodes.  Evaluation by neurologist came up negative with any specific etiology.  I do not believe any the medications were giving her her involvement.  On her last visit we reduced her Lexapro to a more appropriate dose of 10 mg.  She takes Ambien on a as needed basis.  She takes a fixed dose of Xanax 0.5 mg twice daily.  She is sleeping and eating actually very well.  She is got good energy.  She is taking concentrating without problem.  She likes to read Mrs. Netflix and music.  She is enjoying life.  She is not suicidal.  She denies use of alcohol or drugs.  She denies any neurological symptoms at this time.  She still drives lives independently.  She is functioning actually very well.  He likes to eat out and she still socializes a lot.  The COVID virus is not impacting her. Visit Diagnosis:    ICD-10-CM   1. Severe episode of recurrent major depressive disorder, without psychotic features ([facility])  F33.2 ALPRAZolam (XANAX) 0.5 MG tablet    escitalopram (LEXAPRO) 10 MG tablet    Past Psychiatric History: See intake H&P for full details. Reviewed, with no updates at this time.   Past Medical History:  Past Medical History:  Diagnosis Date  . Allergy   . Anxiety   . Arthritis   . Cough    due allergies chronic  . Depression   . Dilatation of esophagus   . Fibromyalgia   . GERD (gastroesophageal reflux disease)   . Headache(784.0)   . Heart murmur    one time  . Hypertension   . PONV (postoperative nausea and vomiting)    hx of Vertigo after anesthesia    Past Surgical History:  Procedure Laterality Date  . ABDOMINAL HYSTERECTOMY    . BACK SURGERY  12/21/12  . CHOLECYSTECTOMY    . COLONOSCOPY    . EYE SURGERY  Bilateral    lasik    Family Psychiatric History: See intake H&P for full details. Reviewed, with no updates at this time.   Family History: No family history on file.  Social History:  Social History   Socioeconomic History  . Marital status: Widowed    Spouse name: Not on file  . Number of children: Not on file  . Years of education: Not on file  . Highest education level: Not on file  Occupational History  . Not on file  Tobacco Use  . Smoking status: Never Smoker  . Smokeless tobacco: Never Used  Vaping Use  . Vaping Use: Never used  Substance and Sexual Activity  . Alcohol use: No    Comment: social  . Drug use: No  . Sexual activity: Never  Other Topics Concern  . Not on file  Social History Narrative  . Not on file   Social Determinants of Health   Financial Resource Strain:   . Difficulty of Paying Living Expenses: Not on file  Food Insecurity:   . Worried About Programme researcher, broadcasting/film/video in the Last Year: Not on file  . Ran Out of Food in the Last Year: Not on file  Transportation Needs:   . Lack of  Transportation (Medical): Not on file  . Lack of Transportation (Non-Medical): Not on file  Physical Activity:   . Days of Exercise per Week: Not on file  . Minutes of Exercise per Session: Not on file  Stress:   . Feeling of Stress : Not on file  Social Connections:   . Frequency of Communication with Friends and Family: Not on file  . Frequency of Social Gatherings with Friends and Family: Not on file  . Attends Religious Services: Not on file  . Active Member of Clubs or Organizations: Not on file  . Attends Banker Meetings: Not on file  . Marital Status: Not on file    Allergies:  Allergies  Allergen Reactions  . Lisinopril Cough    Deep cough  . Amoxicillin Rash    Has patient had a PCN reaction causing immediate rash, facial/tongue/throat swelling, SOB or lightheadedness with hypotension:unsure Has patient had a PCN reaction causing  severe rash involving mucus membranes or skin necrosis:unsure Has patient had a PCN reaction that required hospitalization:NO  . Has patient had a PCN reaction occurring within the last 10 years:    Yes If all of the above answers are "NO", then may proceed with Cephalosporin use.   . Cephalosporins Other (See Comments)    Flushing of the face    Metabolic Disorder Labs: No results found for: HGBA1C, MPG No results found for: PROLACTIN No results found for: CHOL, TRIG, HDL, CHOLHDL, VLDL, LDLCALC No results found for: TSH  Therapeutic Level Labs: No results found for: LITHIUM No results found for: VALPROATE No components found for:  CBMZ  Current Medications: Current Outpatient Medications  Medication Sig Dispense Refill  . ALPRAZolam (XANAX) 0.5 MG tablet Take 1 tablet (0.5 mg total) by mouth 2 (two) times daily. 60 tablet 5  . amitriptyline (ELAVIL) 25 MG tablet Take 3 tablets (75 mg total) by mouth at bedtime. 270 tablet 1  . Artificial Tear Ointment (SOOTHE NIGHT TIME) OINT Place 1 application into both eyes at bedtime.    Marland Kitchen atorvastatin (LIPITOR) 20 MG tablet Take 20 mg by mouth every Wednesday.     . Biotin w/ Vitamins C & E (HAIR SKIN & NAILS GUMMIES PO) Take 2 tablets by mouth daily.    Marland Kitchen CALCIUM-VITAMIN D-VITAMIN K PO Take 1 tablet by mouth daily with lunch.    . Cholecalciferol (VITAMIN D) 2000 UNITS CAPS Take 2,000 Units by mouth daily.    . clobetasol ointment (TEMOVATE) 0.05 % Apply 1 application topically 2 (two) times daily as needed (for rash).    . COD LIVER OIL PO Take 5 mLs by mouth daily.    Marland Kitchen docusate sodium (RA COL-RITE) 100 MG capsule Take 100 mg by mouth 2 (two) times daily.    Marland Kitchen escitalopram (LEXAPRO) 10 MG tablet Take 2 tablets (20 mg total) by mouth daily. 30 tablet 5  . folic acid (FOLVITE) 1 MG tablet Take 1 mg by mouth daily.    . hydrocortisone 2.5 % cream Apply 1 application topically 2 (two) times daily as needed (for skin irritation/rash).     Marland Kitchen  losartan (COZAAR) 100 MG tablet Take 100 mg by mouth daily.    . Magnesium 250 MG TABS Take 250 mg by mouth daily.    . methotrexate (RHEUMATREX) 2.5 MG tablet Take by mouth.    Marland Kitchen omeprazole (PRILOSEC OTC) 20 MG tablet Take 20 mg by mouth daily before breakfast.    . Propylene Glycol (SYSTANE BALANCE)  0.6 % SOLN Place 1 drop into both eyes 4 (four) times daily as needed (for dry/irritated eyes (scheduled EVERY MORNING)).    Marland Kitchen zolpidem (AMBIEN) 5 MG tablet Take 1 tablet (5 mg total) by mouth at bedtime as needed for sleep. 30 tablet 0   No current facility-administered medications for this visit.     Musculoskeletal: Strength & Muscle Tone: within normal limits Gait & Station: normal Patient leans: N/A  Psychiatric Specialty Exam: ROS  There were no vitals taken for this visit.There is no height or weight on file to calculate BMI.  General Appearance: Casual and Fairly Groomed  Eye Contact:  Fair  Speech:  Clear and Coherent and Normal Rate  Volume:  Normal  Mood:  Anxious and Dysphoric  Affect:  Appropriate and Congruent  Thought Process:  Goal Directed and Descriptions of Associations: Intact  Orientation:  Full (Time, Place, and Person)  Thought Content: Logical   Suicidal Thoughts:  No  Homicidal Thoughts:  No  Memory:  Immediate;   Fair  Judgement:  Intact  Insight:  Shallow  Psychomotor Activity:  Normal  Concentration:  Attention Span: Fair  Recall:  [employer] of Knowledge: Fair  Language: Good  Akathisia:  Negative  Handed:  Right  AIMS (if indicated): not done  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  ADL's:  Intact  Cognition: WNL  Sleep:  Good   Screenings:   Assessment and Plan:     The patient's first problem seems to be that of major depression.  At this time she is in remission.  Her mood is stable and she has no vegetative symptoms.  She takes Lexapro 10 mg.  Her second problem  is an adjustment disorder with an anxious mood state.  At this time her stress level seems to be low.  The possibility of reducing her Xanax will be considered in the next year.  Presently she takes a low-dose 25 mg twice daily.  Her third problem is insomnia.  This occurs now on it and she benefits by taking a low-dose of Ambien.  Presently patient is not in therapy.  She is doing very well and will return to see me in 5 months. 1. Severe episode of recurrent major depressive disorder, without psychotic features ([facility])     Status of current problems: unchanged  Labs Ordered: No orders of the defined types were placed in this encounter.   Labs Reviewed: NA  Collateral Obtained/Records Reviewed: NA  Plan:  Continue Xanax, Lexapro, Elavil as prescribed RTC 3 MONTHS   Gypsy Balsam, MD 10/03/2019, 12:00 PM

## 2019-11-15 ENCOUNTER — Telehealth: Payer: Self-pay

## 2019-11-15 ENCOUNTER — Other Ambulatory Visit: Payer: Self-pay | Admitting: Neurosurgery

## 2019-11-15 DIAGNOSIS — M5442 Lumbago with sciatica, left side: Secondary | ICD-10-CM

## 2019-11-15 DIAGNOSIS — M5441 Lumbago with sciatica, right side: Secondary | ICD-10-CM

## 2019-11-15 NOTE — Telephone Encounter (Signed)
Spoke with patient to review her medications and drug allergies before getting her scheduled for a lumbar myelogram.  She states an understanding she will be here two hours, will need a driver, will need to be on strict bedrest (explained) for 24 hours after the procedure, and will need to hold Lexapro for 48 hours before, and 24 hours after, the procedure.

## 2019-11-20 ENCOUNTER — Ambulatory Visit
Admission: RE | Admit: 2019-11-20 | Discharge: 2019-11-20 | Disposition: A | Discharge status: Home / Self Care | Payer: Medicare HMO | Source: Ambulatory Visit | Attending: Neurosurgery | Admitting: Neurosurgery

## 2019-11-20 ENCOUNTER — Other Ambulatory Visit: Payer: Self-pay

## 2019-11-20 DIAGNOSIS — M5441 Lumbago with sciatica, right side: Secondary | ICD-10-CM

## 2019-11-20 DIAGNOSIS — M5442 Lumbago with sciatica, left side: Secondary | ICD-10-CM

## 2019-11-20 MED ORDER — DIAZEPAM 5 MG PO TABS
5.0000 mg | ORAL_TABLET | Freq: Once | ORAL | Status: AC
  Administered 2019-11-20: 5 mg via ORAL

## 2019-11-20 MED ORDER — ONDANSETRON HCL 4 MG/2ML IJ SOLN: 4.0000 mg | Freq: Four times a day (QID) | INTRAMUSCULAR | Status: DC | PRN

## 2019-11-20 MED ORDER — IOPAMIDOL (ISOVUE-M 200) INJECTION 41%
15.0000 mL | Freq: Once | INTRAMUSCULAR | Status: AC
  Administered 2019-11-20: 15 mL via INTRATHECAL

## 2019-11-20 NOTE — Progress Notes (Signed)
Pt reports she has been off of her Lexapro for at least 48 hours.

## 2019-11-20 NOTE — Discharge Instructions (Signed)
Myelogram Discharge Instructions  1. Go home and rest quietly for the next 24 hours.  It is important to lie flat for the next 24 hours.  Get up only to go to the restroom.  You may lie in the bed or on a couch on your back, your stomach, your left side or your right side.  You may have one pillow under your head.  You may have pillows between your knees while you are on your side or under your knees while you are on your back.  2. DO NOT drive today.  Recline the seat as far back as it will go, while still wearing your seat belt, on the way home.  3. You may get up to go to the bathroom as needed.  You may sit up for 10 minutes to eat.  You may resume your normal diet and medications unless otherwise indicated.  Drink lots of extra fluids today and tomorrow.  4. The incidence of headache, nausea, or vomiting is about 5% (one in 20 patients).  If you develop a headache, lie flat and drink plenty of fluids until the headache goes away.  Caffeinated beverages may be helpful.  If you develop severe nausea and vomiting or a headache that does not go away with flat bed rest, call [phone].  5. You may resume normal activities after your 24 hours of bed rest is over; however, do not exert yourself strongly or do any heavy lifting tomorrow. If when you get up you have a headache when standing, go back to bed and force fluids for another 24 hours.  6. Call your physician for a follow-up appointment.  The results of your myelogram will be sent directly to your physician by the following day.  7. If you have any questions or if complications develop after you arrive home, please call [phone].  Discharge instructions have been explained to the patient.  The patient, or the person responsible for the patient, fully understands these instructions  YOU MAY RESUME YOUR LEXAPRO TOMORROW 11/21/19 AT 1PM

## 2020-03-05 ENCOUNTER — Other Ambulatory Visit: Payer: Self-pay

## 2020-03-05 ENCOUNTER — Telehealth (INDEPENDENT_AMBULATORY_CARE_PROVIDER_SITE_OTHER): Payer: Medicare HMO | Admitting: Psychiatry

## 2020-03-05 DIAGNOSIS — F331 Major depressive disorder, recurrent, moderate: Secondary | ICD-10-CM

## 2020-03-05 DIAGNOSIS — F332 Major depressive disorder, recurrent severe without psychotic features: Secondary | ICD-10-CM | POA: Diagnosis not present

## 2020-03-05 MED ORDER — LORAZEPAM 0.5 MG PO TABS: ORAL_TABLET | ORAL | 4 refills | Status: DC

## 2020-03-05 MED ORDER — ESCITALOPRAM OXALATE 10 MG PO TABS: 20.0000 mg | ORAL_TABLET | Freq: Every day | ORAL | 5 refills | Status: DC

## 2020-03-05 NOTE — Progress Notes (Signed)
BH MD/PA/NP OP Progress Note  03/05/2020 1:59 PM Carrie Austin  MRN:  [id number]  Chief Complaint: Major depression in remission  HPI:   Today the patient is not doing as well.  Her anxiety level is higher.  It may possibly be related to a trip planned to go to [place] few months.  She is going to be with her son in his home in [place].  The last time she was in [place] she got sick.  The pandemic seems to be bothering her more than she admitted last time.  Generally patient says she feels lonely.  When she goes out on people she does okay but when she is home by herself she feels nervous and anxious.  She not had any more syncope.  Her sleep pattern has not really changed all that much.  She has 1 or 2 very difficult nights but most nights she is sleeping well.  She takes no naps.  Her appetite is stable but she is having a lot of gastroenterology problems.  She has an endoscopy scheduled in March.  Her taste for things seem to have changed.  She still has pretty good energy and she still likes to read and watch TV.  The patient has some chronic mild back pain that she takes OxyContin on a as needed basis.  Overall though her health is fairly good other than her GI problems.  Financially she is stable.  She does have a good female friend who she spends time.  The patient likes to walk for exercise.  She likes her own home which she does not own.  The patient is functioning reasonably well.  She is not suicidal.  She drinks no alcohol uses no drugs. Visit Diagnosis:    ICD-10-CM   1. Severe episode of recurrent major depressive disorder, without psychotic features (HCC)  F33.2 escitalopram (LEXAPRO) 10 MG tablet    Past Psychiatric History: See intake H&P for full details. Reviewed, with no updates at this time.   Past Medical History:  Past Medical History:  Diagnosis Date   Allergy    Anxiety    Arthritis    Cough    due allergies chronic   Depression    Dilatation of esophagus     Fibromyalgia    GERD (gastroesophageal reflux disease)    Headache(784.0)    Heart murmur    one time   Hypertension    PONV (postoperative nausea and vomiting)    hx of Vertigo after anesthesia    Past Surgical History:  Procedure Laterality Date   ABDOMINAL HYSTERECTOMY     BACK SURGERY  12/21/12   CHOLECYSTECTOMY     COLONOSCOPY     EYE SURGERY Bilateral    lasik    Family Psychiatric History: See intake H&P for full details. Reviewed, with no updates at this time.   Family History: No family history on file.  Social History:  Social History   Socioeconomic History   Marital status: Widowed    Spouse name: Not on file   Number of children: Not on file   Years of education: Not on file   Highest education level: Not on file  Occupational History   Not on file  Tobacco Use   Smoking status: Never Smoker   Smokeless tobacco: Never Used  Vaping Use   Vaping Use: Never used  Substance and Sexual Activity   Alcohol use: No    Comment: social   Drug use: No  Sexual activity: Never  Other Topics Concern   Not on file  Social History Narrative   Not on file   Social Determinants of Health   Financial Resource Strain: Not on file  Food Insecurity: Not on file  Transportation Needs: Not on file  Physical Activity: Not on file  Stress: Not on file  Social Connections: Not on file    Allergies:  Allergies  Allergen Reactions   Lisinopril Cough    Deep cough   Amoxicillin Rash    Has patient had a PCN reaction causing immediate rash, facial/tongue/throat swelling, SOB or lightheadedness with hypotension:unsure Has patient had a PCN reaction causing severe rash involving mucus membranes or skin necrosis:unsure Has patient had a PCN reaction that required hospitalization:NO  . Has patient had a PCN reaction occurring within the last 10 years:    Yes If all of the above answers are "NO", then may proceed with Cephalosporin use.     Cephalosporins Other (See Comments)    Flushing of the face    Metabolic Disorder Labs: No results found for: HGBA1C, MPG No results found for: PROLACTIN No results found for: CHOL, TRIG, HDL, CHOLHDL, VLDL, LDLCALC No results found for: TSH  Therapeutic Level Labs: No results found for: LITHIUM No results found for: VALPROATE No components found for:  CBMZ  Current Medications: Current Outpatient Medications  Medication Sig Dispense Refill   LORazepam (ATIVAN) 0.5 MG tablet 1 bid 60 tablet 4   Artificial Tear Ointment (SOOTHE NIGHT TIME) OINT Place 1 application into both eyes at bedtime.     atorvastatin (LIPITOR) 20 MG tablet Take 20 mg by mouth every Wednesday.      cetirizine (ZYRTEC) 10 MG tablet Take by mouth.     Cholecalciferol (VITAMIN D) 2000 UNITS CAPS Take 2,000 Units by mouth daily.     clobetasol ointment (TEMOVATE) 0.05 % Apply 1 application topically 2 (two) times daily as needed (for rash).     COD LIVER OIL PO Take 5 mLs by mouth daily.     docusate sodium (RA COL-RITE) 100 MG capsule Take 100 mg by mouth 2 (two) times daily.     escitalopram (LEXAPRO) 10 MG tablet Take 2 tablets (20 mg total) by mouth daily. 30 tablet 5   folic acid (FOLVITE) 1 MG tablet Take 1 mg by mouth daily.     gabapentin (NEURONTIN) 100 MG capsule Take by mouth.     hydrocortisone 2.5 % cream Apply 1 application topically 2 (two) times daily as needed (for skin irritation/rash).      losartan (COZAAR) 100 MG tablet Take 100 mg by mouth daily.     Magnesium 250 MG TABS Take 250 mg by mouth daily.     meloxicam (MOBIC) 7.5 MG tablet TAKE 1 TABLET(7.5 MG) BY MOUTH TWICE DAILY FOR UP TO 60 DOSES AS NEEDED     methotrexate (RHEUMATREX) 2.5 MG tablet Take by mouth.     oxyCODONE-acetaminophen (PERCOCET/ROXICET) 5-325 MG tablet Take by mouth every 4 (four) hours as needed for severe pain.     Propylene Glycol (SYSTANE BALANCE) 0.6 % SOLN Place 1 drop into both eyes 4 (four)  times daily as needed (for dry/irritated eyes (scheduled EVERY MORNING)).     silver sulfADIAZINE (SILVADENE) 1 % cream Apply to affected area twice daily during dressing change.     No current facility-administered medications for this visit.     Musculoskeletal: Strength & Muscle Tone: within normal limits Gait & Station: normal Patient  leans: N/A  Psychiatric Specialty Exam: ROS  There were no vitals taken for this visit.There is no height or weight on file to calculate BMI.  General Appearance: Casual and Fairly Groomed  Eye Contact:  Fair  Speech:  Clear and Coherent and Normal Rate  Volume:  Normal  Mood:  Anxious and Dysphoric  Affect:  Appropriate and Congruent  Thought Process:  Goal Directed and Descriptions of Associations: Intact  Orientation:  Full (Time, Place, and Person)  Thought Content: Logical   Suicidal Thoughts:  No  Homicidal Thoughts:  No  Memory:  Immediate;   Fair  Judgement:  Intact  Insight:  Shallow  Psychomotor Activity:  Normal  Concentration:  Attention Span: Fair  Recall:  [employer] of Knowledge: Fair  Language: Good  Akathisia:  Negative  Handed:  Right  AIMS (if indicated): not done  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  ADL's:  Intact  Cognition: WNL  Sleep:  Good   Screenings:   Assessment and Plan:     This patient has 2 problems.  First is that of major depression.  At this time she will continue taking Lexapro 10 mg.  Her second problem will be adjustment disorder with an anxious mood state.  In my opinion the better thing to do is to discontinue her Xanax which is a shorter acting agent in which she takes 0.5 twice daily.  At this time we will replace it with Ativan 0.5 mg twice daily.  I do not think I will address her issues with sleep at this time.  Essentially she will continue Lexapro and switch Xanax to a safer drug that of Ativan 0.5 mg twice  daily.  This patient will be evaluated again in 3 months. 1. Severe episode of recurrent major depressive disorder, without psychotic features (HCC)     Status of current problems: unchanged  Labs Ordered: No orders of the defined types were placed in this encounter.   Labs Reviewed: NA  Collateral Obtained/Records Reviewed: NA  Plan:  Continue Xanax, Lexapro, Elavil as prescribed RTC 3 MONTHS   Gypsy Balsam, MD 03/05/2020, 1:59 PM

## 2020-03-18 ENCOUNTER — Telehealth (HOSPITAL_COMMUNITY): Payer: Self-pay | Admitting: *Deleted

## 2020-03-18 NOTE — Telephone Encounter (Signed)
Prior authorization received for Escitalopram.  Auth dates 03/2020 thru 01/10/2021.

## 2020-03-25 ENCOUNTER — Telehealth (HOSPITAL_COMMUNITY): Payer: Self-pay

## 2020-03-25 ENCOUNTER — Other Ambulatory Visit (HOSPITAL_COMMUNITY): Payer: Self-pay

## 2020-03-25 DIAGNOSIS — F332 Major depressive disorder, recurrent severe without psychotic features: Secondary | ICD-10-CM

## 2020-03-25 MED ORDER — ESCITALOPRAM OXALATE 10 MG PO TABS: 20.0000 mg | ORAL_TABLET | Freq: Every day | ORAL | 5 refills | Status: DC

## 2020-03-25 NOTE — Telephone Encounter (Signed)
[employer] APPROVED  ESCITALOPRAM OXALATE 10MG  TABLET PA # EFFECTIVE 01/12/2020 TO 01/10/2021  S/W [facility] INFORMED PHARMACY/PT SET UP FOR TEXT OR EMAIL NOTIFICATION WHEN MEDICATION IS READY FOR PICKUP

## 2020-06-03 ENCOUNTER — Telehealth (HOSPITAL_COMMUNITY): Payer: Medicare HMO | Admitting: Psychiatry

## 2020-06-13 ENCOUNTER — Telehealth (INDEPENDENT_AMBULATORY_CARE_PROVIDER_SITE_OTHER): Payer: Medicare HMO | Admitting: Psychiatry

## 2020-06-13 ENCOUNTER — Other Ambulatory Visit: Payer: Self-pay

## 2020-06-13 ENCOUNTER — Other Ambulatory Visit: Payer: Self-pay | Admitting: Neurosurgery

## 2020-06-13 DIAGNOSIS — F332 Major depressive disorder, recurrent severe without psychotic features: Secondary | ICD-10-CM

## 2020-06-13 DIAGNOSIS — F325 Major depressive disorder, single episode, in full remission: Secondary | ICD-10-CM | POA: Diagnosis not present

## 2020-06-13 MED ORDER — ESCITALOPRAM OXALATE 10 MG PO TABS: 20.0000 mg | ORAL_TABLET | Freq: Every day | ORAL | 5 refills | Status: DC

## 2020-06-13 MED ORDER — LORAZEPAM 0.5 MG PO TABS: ORAL_TABLET | ORAL | 4 refills | Status: DC

## 2020-06-13 NOTE — Progress Notes (Signed)
BH MD/PA/NP OP Progress Note  06/13/2020 9:15 AM Carrie Austin  MRN:  [id number]  Chief Complaint: Major depression in remission  HPI:   Today the patient is doing well.  She feels much improved.  1 thing that helped her a great deal she says is that we discontinued the Xanax and change it to Ativan 0.5 mg twice daily.  This is a much more appropriate medicine for this individual and she can feel the difference.  She feels less distressed and less lonely.  Her mood is improved.  The patient did get to go to [place] to be with her son who apparently lives in [place].  They had a big gathering and she had a great time.  The patient says she no longer really feels lonely as she complained of last time.  It is noted that she does have a good friend who is I suspect her boyfriend.  Overall the patient is sleeping and eating well.  She has good energy.  The patient drinks no alcohol and uses no drugs.  She is very focused.  Her anxiety level is much less.  Her biggest issue right now she is having some chronic back pain and surgery is being entertained.  Dr. Lovell Sheehan surgeon in [place] is involved in her care.  Other than this emotionally the patient is very stable.  She denies being suicidal.  She is functioning actually very well.  She does live alone and she does well.  She is very pleased with the medication change.   ICD-10-CM   1. Severe episode of recurrent major depressive disorder, without psychotic features (HCC)  F33.2 escitalopram (LEXAPRO) 10 MG tablet    Past Psychiatric History: See intake H&P for full details. Reviewed, with no updates at this time.   Past Medical History:  Past Medical History:  Diagnosis Date  . Allergy   . Anxiety   . Arthritis   . Cough    due allergies chronic  . Depression   . Dilatation of esophagus   . Fibromyalgia   . GERD (gastroesophageal reflux disease)   . Headache(784.0)   . Heart murmur    one time  . Hypertension   . PONV (postoperative  nausea and vomiting)    hx of Vertigo after anesthesia    Past Surgical History:  Procedure Laterality Date  . ABDOMINAL HYSTERECTOMY    . BACK SURGERY  12/21/12  . CHOLECYSTECTOMY    . COLONOSCOPY    . EYE SURGERY Bilateral    lasik    Family Psychiatric History: See intake H&P for full details. Reviewed, with no updates at this time.   Family History: No family history on file.  Social History:  Social History   Socioeconomic History  . Marital status: Widowed    Spouse name: Not on file  . Number of children: Not on file  . Years of education: Not on file  . Highest education level: Not on file  Occupational History  . Not on file  Tobacco Use  . Smoking status: Never Smoker  . Smokeless tobacco: Never Used  Vaping Use  . Vaping Use: Never used  Substance and Sexual Activity  . Alcohol use: No    Comment: social  . Drug use: No  . Sexual activity: Never  Other Topics Concern  . Not on file  Social History Narrative  . Not on file   Social Determinants of Health   Financial Resource Strain: Not on file  Food  Insecurity: Not on file  Transportation Needs: Not on file  Physical Activity: Not on file  Stress: Not on file  Social Connections: Not on file    Allergies:  Allergies  Allergen Reactions  . Lisinopril Cough    Deep cough  . Amoxicillin Rash    Has patient had a PCN reaction causing immediate rash, facial/tongue/throat swelling, SOB or lightheadedness with hypotension:unsure Has patient had a PCN reaction causing severe rash involving mucus membranes or skin necrosis:unsure Has patient had a PCN reaction that required hospitalization:NO  . Has patient had a PCN reaction occurring within the last 10 years:    Yes If all of the above answers are "NO", then may proceed with Cephalosporin use.   . Cephalosporins Other (See Comments)    Flushing of the face    Metabolic Disorder Labs: No results found for: HGBA1C, MPG No results found for:  PROLACTIN No results found for: CHOL, TRIG, HDL, CHOLHDL, VLDL, LDLCALC No results found for: TSH  Therapeutic Level Labs: No results found for: LITHIUM No results found for: VALPROATE No components found for:  CBMZ  Current Medications: Current Outpatient Medications  Medication Sig Dispense Refill  . Artificial Tear Ointment (SOOTHE NIGHT TIME) OINT Place 1 application into both eyes at bedtime.    Marland Kitchen atorvastatin (LIPITOR) 20 MG tablet Take 20 mg by mouth every Wednesday.     . cetirizine (ZYRTEC) 10 MG tablet Take by mouth.    . Cholecalciferol (VITAMIN D) 2000 UNITS CAPS Take 2,000 Units by mouth daily.    . clobetasol ointment (TEMOVATE) 0.05 % Apply 1 application topically 2 (two) times daily as needed (for rash).    . COD LIVER OIL PO Take 5 mLs by mouth daily.    Marland Kitchen docusate sodium (RA COL-RITE) 100 MG capsule Take 100 mg by mouth 2 (two) times daily.    Marland Kitchen escitalopram (LEXAPRO) 10 MG tablet Take 2 tablets (20 mg total) by mouth daily. 60 tablet 5  . folic acid (FOLVITE) 1 MG tablet Take 1 mg by mouth daily.    Marland Kitchen gabapentin (NEURONTIN) 100 MG capsule Take by mouth.    . hydrocortisone 2.5 % cream Apply 1 application topically 2 (two) times daily as needed (for skin irritation/rash).     . LORazepam (ATIVAN) 0.5 MG tablet 1 bid 60 tablet 4  . losartan (COZAAR) 100 MG tablet Take 100 mg by mouth daily.    . Magnesium 250 MG TABS Take 250 mg by mouth daily.    . meloxicam (MOBIC) 7.5 MG tablet TAKE 1 TABLET(7.5 MG) BY MOUTH TWICE DAILY FOR UP TO 60 DOSES AS NEEDED    . methotrexate (RHEUMATREX) 2.5 MG tablet Take by mouth.    . oxyCODONE-acetaminophen (PERCOCET/ROXICET) 5-325 MG tablet Take by mouth every 4 (four) hours as needed for severe pain.    Marland Kitchen Propylene Glycol (SYSTANE BALANCE) 0.6 % SOLN Place 1 drop into both eyes 4 (four) times daily as needed (for dry/irritated eyes (scheduled EVERY MORNING)).    Marland Kitchen silver sulfADIAZINE (SILVADENE) 1 % cream Apply to affected area twice  daily during dressing change.     No current facility-administered medications for this visit.     Musculoskeletal: Strength & Muscle Tone: within normal limits Gait & Station: normal Patient leans: N/A  Psychiatric Specialty Exam: ROS  There were no vitals taken for this visit.There is no height or weight on file to calculate BMI.  General Appearance: Casual and Fairly Groomed  Eye Contact:  Fair  Speech:  Clear and Coherent and Normal Rate  Volume:  Normal  Mood:  Anxious and Dysphoric  Affect:  Appropriate and Congruent  Thought Process:  Goal Directed and Descriptions of Associations: Intact  Orientation:  Full (Time, Place, and Person)  Thought Content: Logical   Suicidal Thoughts:  No  Homicidal Thoughts:  No  Memory:  Immediate;   Fair  Judgement:  Intact  Insight:  Shallow  Psychomotor Activity:  Normal  Concentration:  Attention Span: Fair  Recall:  [employer] of Knowledge: Fair  Language: Good  Akathisia:  Negative  Handed:  Right  AIMS (if indicated): not done  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  ADL's:  Intact  Cognition: WNL  Sleep:  Good   Screenings:   Assessment and Plan:    This patient's first problem is that of major depression.  She takes 10 mg of Lexapro does very well.  Her second problem is an adjustment disorder with an anxious mood state.  She takes Ativan 0.5 mg twice daily which we change from Xanax.  She clearly feels that her anxiety is much improved.  She will continue taking both of these agents.  She is not in therapy at this time.  She will return to see me hopefully in person in 4 months. 1. Severe episode of recurrent major depressive disorder, without psychotic features (HCC)     Status of current problems: unchanged  Labs Ordered: No orders of the defined types were placed in this encounter.   Labs Reviewed: NA  Collateral Obtained/Records  Reviewed: NA  Plan:  Continue Xanax, Lexapro, Elavil as prescribed RTC 3 MONTHS   Gypsy Balsam, MD 06/13/2020, 9:15 AM

## 2020-06-30 ENCOUNTER — Other Ambulatory Visit: Payer: Self-pay | Admitting: Neurosurgery

## 2020-07-04 NOTE — Pre-Procedure Instructions (Signed)
Surgical Instructions:    Your procedure is scheduled on Thursday, June 30th (07:30 AM- 11:17 AM).  Report to [facility] Main Entrance "A" at 05:30 A.M., then check in with the Admitting office.  Call this number if you have any questions prior to your surgery date, or have problems the morning of surgery:  [phone]    Remember:  Do not eat or drink after midnight the night before your surgery.     Take these medicines the morning of surgery with A SIP OF WATER:   diltiazem (CARDIZEM CD) escitalopram (LEXAPRO) LORazepam (ATIVAN)  Propylene Glycol eye drops  IF NEEDED: oxyCODONE-acetaminophen (PERCOCET/ROXICET)  Artificial Tear Solution (SOOTHE XP OP) eye drops   As of today, STOP taking any Aspirin (unless otherwise instructed by your surgeon), NSAIDs such as diclofenac Sodium (VOLTAREN) GEL, meloxicam (MOBIC), Aleve, Naproxen, Ibuprofen, Motrin, Advil, Goody's, BC's, all herbal medications, fish oil, and all vitamins.             Special instructions:    [place]- Preparing For Surgery  Before surgery, you can play an important role. Because skin is not sterile, your skin needs to be as free of germs as possible. You can reduce the number of germs on your skin by washing with CHG (chlorahexidine gluconate) Soap before surgery.  CHG is an antiseptic cleaner which kills germs and bonds with the skin to continue killing germs even after washing.     Please do not use if you have an allergy to CHG or antibacterial soaps. If your skin becomes reddened/irritated stop using the CHG.  Do not shave (including legs and underarms) for at least 48 hours prior to first CHG shower. It is OK to shave your face.  Please follow these instructions carefully.     Shower the NIGHT BEFORE SURGERY and the MORNING OF SURGERY with CHG Soap.   If you chose to wash your hair, wash your hair first as usual with your normal shampoo. After you shampoo, rinse your hair and body thoroughly to  remove the shampoo.  Then [employer] and genitals (private parts) with your normal soap and rinse thoroughly to remove soap.  After that Use CHG Soap as you would any other liquid soap. You can apply CHG directly to the skin and wash gently with a scrungie or a clean washcloth.   Apply the CHG Soap to your body ONLY FROM THE NECK DOWN.  Do not use on open wounds or open sores. Avoid contact with your eyes, ears, mouth and genitals (private parts). Wash Face and genitals (private parts)  with your normal soap.   Wash thoroughly, paying special attention to the area where your surgery will be performed.  Thoroughly rinse your body with warm water from the neck down.  DO NOT shower/wash with your normal soap after using and rinsing off the CHG Soap.  Pat yourself dry with a CLEAN TOWEL.  Wear CLEAN PAJAMAS to bed the night before surgery  Place CLEAN SHEETS on your bed the night before your surgery  DO NOT SLEEP WITH PETS.   Day of Surgery:  Take a shower with CHG soap. Wear Clean/Comfortable clothing the morning of surgery Do not wear lotions, powders, perfumes, or deodorant.   Remember to brush your teeth WITH YOUR REGULAR TOOTHPASTE. Do not wear jewelry or makeup. DO Not wear nail polish, gel polish, artificial nails, or any other type of covering on natural nails including finger and toenails. If patients have artificial nails, gel  coating, etc. that need to be removed by a nail salon please have this removed prior to surgery or surgery may need to be canceled/delayed if the surgeon/ anesthesia feels like the patient is unable to be adequately monitored. Do not shave 48 hours prior to surgery.   Do not bring valuables to the hospital. [facility] is not responsible for any belongings or valuables.  Do NOT Smoke (Tobacco/Vaping) or drink Alcohol 24 hours prior to your procedure.  If you use a CPAP at night, you may bring all equipment for your overnight stay.   Contacts, glasses,  dentures or bridgework may not be worn into surgery, please bring cases for these belongings.   For patients admitted to the hospital, discharge time will be determined by your treatment team.  Patients discharged the day of surgery will not be allowed to drive home, and someone needs to stay with them for 24 hours.  ONLY 1 SUPPORT PERSON MAY BE PRESENT WHILE YOU ARE IN SURGERY. IF YOU ARE TO BE ADMITTED ONCE YOU ARE IN YOUR ROOM YOU WILL BE ALLOWED TWO (2) VISITORS.  Minor children may have two parents present. Special consideration for safety and communication needs will be reviewed on a case by case basis.     Please read over the following fact sheets that you were given.

## 2020-07-07 ENCOUNTER — Other Ambulatory Visit: Payer: Self-pay

## 2020-07-07 ENCOUNTER — Encounter (HOSPITAL_COMMUNITY): Payer: Self-pay

## 2020-07-07 ENCOUNTER — Encounter (HOSPITAL_COMMUNITY)
Admission: RE | Admit: 2020-07-07 | Discharge: 2020-07-07 | Disposition: A | Discharge status: Home / Self Care | Payer: Medicare HMO | Source: Ambulatory Visit | Attending: Neurosurgery | Admitting: Neurosurgery

## 2020-07-07 DIAGNOSIS — Z20822 Contact with and (suspected) exposure to covid-19: Secondary | ICD-10-CM | POA: Insufficient documentation

## 2020-07-07 DIAGNOSIS — Z01812 Encounter for preprocedural laboratory examination: Secondary | ICD-10-CM | POA: Insufficient documentation

## 2020-07-07 LAB — CBC
HCT: 37.4 % (ref 36.0–46.0)
Hemoglobin: 13.2 g/dL (ref 12.0–15.0)
MCH: 31.2 pg (ref 26.0–34.0)
MCHC: 35.3 g/dL (ref 30.0–36.0)
MCV: 88.4 fL (ref 80.0–100.0)
Platelets: 226 10*3/uL (ref 150–400)
RBC: 4.23 MIL/uL (ref 3.87–5.11)
RDW: 12.7 % (ref 11.5–15.5)
WBC: 6.4 10*3/uL (ref 4.0–10.5)
nRBC: 0 % (ref 0.0–0.2)

## 2020-07-07 LAB — BASIC METABOLIC PANEL
Anion gap: 10 (ref 5–15)
BUN: 11 mg/dL (ref 8–23)
CO2: 27 mmol/L (ref 22–32)
Calcium: 9 mg/dL (ref 8.9–10.3)
Chloride: 95 mmol/L — ABNORMAL LOW (ref 98–111)
Creatinine, Ser: 0.68 mg/dL (ref 0.44–1.00)
GFR, Estimated: >60 mL/min (ref >60)
Glucose, Bld: 106 mg/dL — ABNORMAL HIGH (ref 70–99)
Potassium: 3.3 mmol/L — ABNORMAL LOW (ref 3.5–5.1)
Sodium: 132 mmol/L — ABNORMAL LOW (ref 135–145)

## 2020-07-07 LAB — TYPE AND SCREEN
ABO/RH(D): AB POS
Antibody Screen: NEGATIVE

## 2020-07-07 LAB — SURGICAL PCR SCREEN
MRSA, PCR: NEGATIVE
Staphylococcus aureus: NEGATIVE

## 2020-07-07 NOTE — Progress Notes (Addendum)
PCP - Deforest Hoyles, MD Cardiologist - Denies  PPM/ICD - Denies  Chest x-ray - 10/31/19- Care Everywhere EKG - 08/14/19- Care Everywhere; tracing requested  Stress Test - 09/05/19 ECHO - 08/06/11- Media Correspondence 12/26/12 Cardiac Cath - Denies  Sleep Study - Denies  Patient is not diabetic.  Blood Thinner Instructions: N/A Aspirin Instructions: N/A  ERAS Protcol - N/A PRE-SURGERY Ensure or G2- N/A  COVID TEST- 07/06/20   Anesthesia review: Yes, review EKG tracing, ECHO & STRESS TEST  Patient denies shortness of breath, fever, cough and chest pain at PAT appointment   All instructions explained to the patient, with a verbal understanding of the material. Patient agrees to go over the instructions while at home for a better understanding. Patient also instructed to self quarantine after being tested for COVID-19. The opportunity to ask questions was provided.

## 2020-07-08 LAB — SARS CORONAVIRUS 2 (TAT 6-24 HRS): SARS Coronavirus 2: NEGATIVE

## 2020-07-08 NOTE — Anesthesia Preprocedure Evaluation (Addendum)
Anesthesia Evaluation  Patient identified by MRN, date of birth, ID band Patient awake    Reviewed: Allergy & Precautions, H&P , NPO status , Patient's Chart, lab work & pertinent test results  History of Anesthesia Complications (+) PONV and history of anesthetic complications  Airway Mallampati: II   Neck ROM: full    Dental  (+) Teeth Intact, Dental Advisory Given   Pulmonary neg pulmonary ROS,    breath sounds clear to auscultation       Cardiovascular hypertension,  Rhythm:regular Rate:Normal     Neuro/Psych  Headaches, PSYCHIATRIC DISORDERS Anxiety Depression  Neuromuscular disease    GI/Hepatic GERD  ,  Endo/Other    Renal/GU      Musculoskeletal  (+) Arthritis , Rheumatoid disorders,  Fibromyalgia -  Abdominal   Peds  Hematology   Anesthesia Other Findings   Reproductive/Obstetrics                           Anesthesia Physical Anesthesia Plan  ASA: 2  Anesthesia Plan: General   Post-op Pain Management:    Induction: Intravenous  PONV Risk Score and Plan: 4 or greater and Ondansetron, Dexamethasone and Treatment may vary due to age or medical condition  Airway Management Planned: Oral ETT  Additional Equipment:   Intra-op Plan:   Post-operative Plan: Extubation in OR  Informed Consent: I have reviewed the patients History and Physical, chart, labs and discussed the procedure including the risks, benefits and alternatives for the proposed anesthesia with the patient or authorized representative who has indicated his/her understanding and acceptance.     Dental advisory given  Plan Discussed with: CRNA, Anesthesiologist and Surgeon  Anesthesia Plan Comments: (See APP note by Joslyn Hy, FNP )       Anesthesia Quick Evaluation

## 2020-07-08 NOTE — Progress Notes (Signed)
Anesthesia Chart Review:   Case: [id number] Date/Time: 07/10/20 0715   Procedure: PLIF, IP, L1-L2; EXTEND INSTRUMENTATION TO T10 - 3C   Anesthesia type: General   Pre-op diagnosis: LUMBAR ADJACENT SEGMENT DISEASE WITH SPONDYLOLISTHESIS   Location: [facility] ROOM [facility] / [facility]   Surgeons: Tressie Stalker, MD       DISCUSSION: Pt is 75 years old with hx HTN, heart murmur (at cardiology visit 09/25/19 "no murmur" Documented)  VS: BP (!) 133/54   Pulse 61   Temp 36.9 C (Oral)   Resp 17   Ht 5' (1.524 m)   Wt 42 kg   SpO2 98%   BMI 18.08 kg/m   PROVIDERS: - PCP is Lyndel Safe., MD (notes in care everywhere)  - Saw cardiologist Dyane Dustman, MD 08/14/19 for syncope x1 (notes in care everywhere). Note indicates pt saw neurology and MRI brain and EEG were unremarkable. Event monitor and stress test ordered, results below. At f/u 09/25/19, prn follow up recommended.    LABS: Labs reviewed: Acceptable for surgery. (all labs ordered are listed, but only abnormal results are displayed)  Labs Reviewed  BASIC METABOLIC PANEL - Abnormal; Notable for the following components:      Result Value   Sodium 132 (*)    Potassium 3.3 (*)    Chloride 95 (*)    Glucose, Bld 106 (*)    All other components within normal limits  SURGICAL PCR SCREEN  SARS CORONAVIRUS 2 (TAT 6-24 HRS)  CBC  TYPE AND SCREEN     IMAGES: CXR 10/31/19 (care everywhere): No active cardiopulmonary disease.    EKG 08/14/19: Sinus rhythm.  LA enlargement.  Anterior-lateral infarct, old.  Right axis may be secondary to infarct. - Pt subsequently underwent stress test that showed normal wall motion and was low risk   CV: 30 day cardiac event monitor (care everywhere): 1.  Normal sinus rhythm predominates during the recording  2.  Sinus bradycardia is noted during sleep, sinus tachycardia is noted during daytime hours  3.  Rare PACs, PVCs noted,  4.  Benign recording  Nuclear stress test 09/05/19 (care everywhere):   Normal cardiac perfusion exam. Prognostically this is a low risk scan  Left ventricular ejection fraction of 92%.  Normal left ventricular wall motion.    Past Medical History:  Diagnosis Date   Allergy    Anxiety    Arthritis    Cough    due allergies chronic   Depression    Dilatation of esophagus    Fibromyalgia    GERD (gastroesophageal reflux disease)    Headache(784.0)    migraines   Heart murmur    one time   Hypertension    PONV (postoperative nausea and vomiting)    hx of Vertigo after anesthesia    Past Surgical History:  Procedure Laterality Date   ABDOMINAL HYSTERECTOMY     BACK SURGERY  12/21/2012   CHOLECYSTECTOMY     COLONOSCOPY     DIAGNOSTIC LAPAROSCOPY     lap chole   EYE SURGERY Bilateral    lasik   TUBAL LIGATION      MEDICATIONS:  Artificial Tear Solution (SOOTHE XP OP)   atorvastatin (LIPITOR) 20 MG tablet   Calcium-Vitamin D-Vitamin K (VIACTIV PO)   camphor-menthol (SARNA) lotion   Cholecalciferol (VITAMIN D) 2000 UNITS CAPS   COD LIVER OIL PO   cyanocobalamin 1000 MCG tablet   diclofenac Sodium (VOLTAREN) 1 % GEL   diltiazem (CARDIZEM CD) 120  MG 24 hr capsule   docusate sodium (COLACE) 100 MG capsule   escitalopram (LEXAPRO) 10 MG tablet   folic acid (FOLVITE) 1 MG tablet   hydrochlorothiazide (HYDRODIURIL) 25 MG tablet   LORazepam (ATIVAN) 0.5 MG tablet   Magnesium 250 MG TABS   meloxicam (MOBIC) 7.5 MG tablet   methotrexate (RHEUMATREX) 2.5 MG tablet   oxyCODONE-acetaminophen (PERCOCET/ROXICET) 5-325 MG tablet   Propylene Glycol 0.6 % SOLN   No current facility-administered medications for this encounter.    If no changes, I anticipate pt can proceed with surgery as scheduled.   Rica Mast, PhD, FNP-BC [facility]/Anesthesiology Phone: [phone] 07/08/2020 3:45 PM

## 2020-07-10 ENCOUNTER — Inpatient Hospital Stay (HOSPITAL_COMMUNITY): Payer: Medicare HMO | Admitting: Emergency Medicine

## 2020-07-10 ENCOUNTER — Other Ambulatory Visit: Payer: Self-pay

## 2020-07-10 ENCOUNTER — Encounter (HOSPITAL_COMMUNITY): Payer: Self-pay | Admitting: Neurosurgery

## 2020-07-10 ENCOUNTER — Encounter (HOSPITAL_COMMUNITY)
Admission: RE | Disposition: A | Discharge status: Home / Self Care | Payer: Self-pay | Source: Home / Self Care | Attending: Neurosurgery

## 2020-07-10 ENCOUNTER — Inpatient Hospital Stay (HOSPITAL_COMMUNITY)
Admission: RE | Admit: 2020-07-10 | Discharge: 2020-07-11 | DRG: 454 | Disposition: A | Discharge status: Home / Self Care | Payer: Medicare HMO | Attending: Neurosurgery | Admitting: Neurosurgery

## 2020-07-10 ENCOUNTER — Inpatient Hospital Stay (HOSPITAL_COMMUNITY): Payer: Medicare HMO

## 2020-07-10 DIAGNOSIS — K219 Gastro-esophageal reflux disease without esophagitis: Secondary | ICD-10-CM | POA: Diagnosis present

## 2020-07-10 DIAGNOSIS — Z79899 Other long term (current) drug therapy: Secondary | ICD-10-CM

## 2020-07-10 DIAGNOSIS — E876 Hypokalemia: Secondary | ICD-10-CM | POA: Diagnosis not present

## 2020-07-10 DIAGNOSIS — D62 Acute posthemorrhagic anemia: Secondary | ICD-10-CM | POA: Diagnosis not present

## 2020-07-10 DIAGNOSIS — M1990 Unspecified osteoarthritis, unspecified site: Secondary | ICD-10-CM | POA: Diagnosis present

## 2020-07-10 DIAGNOSIS — E871 Hypo-osmolality and hyponatremia: Secondary | ICD-10-CM | POA: Diagnosis not present

## 2020-07-10 DIAGNOSIS — I10 Essential (primary) hypertension: Secondary | ICD-10-CM | POA: Diagnosis present

## 2020-07-10 DIAGNOSIS — Z419 Encounter for procedure for purposes other than remedying health state, unspecified: Secondary | ICD-10-CM

## 2020-07-10 DIAGNOSIS — Z791 Long term (current) use of non-steroidal anti-inflammatories (NSAID): Secondary | ICD-10-CM

## 2020-07-10 DIAGNOSIS — Z888 Allergy status to other drugs, medicaments and biological substances status: Secondary | ICD-10-CM | POA: Diagnosis not present

## 2020-07-10 DIAGNOSIS — Z9851 Tubal ligation status: Secondary | ICD-10-CM | POA: Diagnosis not present

## 2020-07-10 DIAGNOSIS — M797 Fibromyalgia: Secondary | ICD-10-CM | POA: Diagnosis present

## 2020-07-10 DIAGNOSIS — Z881 Allergy status to other antibiotic agents status: Secondary | ICD-10-CM

## 2020-07-10 DIAGNOSIS — M960 Pseudarthrosis after fusion or arthrodesis: Secondary | ICD-10-CM | POA: Diagnosis present

## 2020-07-10 DIAGNOSIS — L905 Scar conditions and fibrosis of skin: Secondary | ICD-10-CM | POA: Diagnosis present

## 2020-07-10 DIAGNOSIS — Z20822 Contact with and (suspected) exposure to covid-19: Secondary | ICD-10-CM | POA: Diagnosis present

## 2020-07-10 DIAGNOSIS — M48062 Spinal stenosis, lumbar region with neurogenic claudication: Secondary | ICD-10-CM | POA: Diagnosis present

## 2020-07-10 DIAGNOSIS — Z90710 Acquired absence of both cervix and uterus: Secondary | ICD-10-CM

## 2020-07-10 DIAGNOSIS — Z880 Allergy status to penicillin: Secondary | ICD-10-CM | POA: Diagnosis not present

## 2020-07-10 DIAGNOSIS — M5116 Intervertebral disc disorders with radiculopathy, lumbar region: Secondary | ICD-10-CM | POA: Diagnosis present

## 2020-07-10 SURGERY — POSTERIOR LUMBAR FUSION 1 LEVEL
Anesthesia: General

## 2020-07-10 MED ORDER — OXYCODONE HCL 5 MG PO TABS
ORAL_TABLET | ORAL | Status: AC
  Filled 2020-07-10: qty 1

## 2020-07-10 MED ORDER — ROCURONIUM BROMIDE 10 MG/ML (PF) SYRINGE
PREFILLED_SYRINGE | INTRAVENOUS | Status: DC | PRN
  Administered 2020-07-10: 50 mg via INTRAVENOUS
  Administered 2020-07-10: 30 mg via INTRAVENOUS
  Administered 2020-07-10: 10 mg via INTRAVENOUS

## 2020-07-10 MED ORDER — ONDANSETRON HCL 4 MG/2ML IJ SOLN: 4.0000 mg | Freq: Four times a day (QID) | INTRAMUSCULAR | Status: DC | PRN

## 2020-07-10 MED ORDER — ORAL CARE MOUTH RINSE: 15.0000 mL | Freq: Once | OROMUCOSAL | Status: AC

## 2020-07-10 MED ORDER — MIDAZOLAM HCL 5 MG/5ML IJ SOLN
INTRAMUSCULAR | Status: DC | PRN
  Administered 2020-07-10: 1 mg via INTRAVENOUS

## 2020-07-10 MED ORDER — CHLORHEXIDINE GLUCONATE 0.12 % MT SOLN
15.0000 mL | Freq: Once | OROMUCOSAL | Status: AC
  Administered 2020-07-10: 15 mL via OROMUCOSAL
  Filled 2020-07-10: qty 15

## 2020-07-10 MED ORDER — PROPOFOL 10 MG/ML IV BOLUS
INTRAVENOUS | Status: AC
  Filled 2020-07-10: qty 20

## 2020-07-10 MED ORDER — ACETAMINOPHEN 650 MG RE SUPP: 650.0000 mg | RECTAL | Status: DC | PRN

## 2020-07-10 MED ORDER — SODIUM CHLORIDE 0.9 % IV SOLN: 250.0000 mL | INTRAVENOUS | Status: DC

## 2020-07-10 MED ORDER — DEXAMETHASONE SODIUM PHOSPHATE 10 MG/ML IJ SOLN
INTRAMUSCULAR | Status: AC
  Filled 2020-07-10: qty 1

## 2020-07-10 MED ORDER — ESCITALOPRAM OXALATE 20 MG PO TABS
20.0000 mg | ORAL_TABLET | Freq: Every day | ORAL | Status: DC
  Administered 2020-07-11: 20 mg via ORAL
  Filled 2020-07-10: qty 1

## 2020-07-10 MED ORDER — BISACODYL 10 MG RE SUPP: 10.0000 mg | Freq: Every day | RECTAL | Status: DC | PRN

## 2020-07-10 MED ORDER — ACETAMINOPHEN 10 MG/ML IV SOLN
INTRAVENOUS | Status: AC
  Filled 2020-07-10: qty 100

## 2020-07-10 MED ORDER — ACETAMINOPHEN 325 MG PO TABS: 650.0000 mg | ORAL_TABLET | ORAL | Status: DC | PRN

## 2020-07-10 MED ORDER — FENTANYL CITRATE (PF) 250 MCG/5ML IJ SOLN
INTRAMUSCULAR | Status: DC | PRN
  Administered 2020-07-10: 25 ug via INTRAVENOUS
  Administered 2020-07-10: 100 ug via INTRAVENOUS
  Administered 2020-07-10 (×3): 25 ug via INTRAVENOUS

## 2020-07-10 MED ORDER — THROMBIN 5000 UNITS EX SOLR
CUTANEOUS | Status: AC
  Filled 2020-07-10: qty 5000

## 2020-07-10 MED ORDER — LIDOCAINE 2% (20 MG/ML) 5 ML SYRINGE
INTRAMUSCULAR | Status: DC | PRN
  Administered 2020-07-10: 60 mg via INTRAVENOUS

## 2020-07-10 MED ORDER — PROPOFOL 10 MG/ML IV BOLUS
INTRAVENOUS | Status: DC | PRN
  Administered 2020-07-10: 80 mg via INTRAVENOUS

## 2020-07-10 MED ORDER — OXYCODONE-ACETAMINOPHEN 5-325 MG PO TABS: 1.0000 | ORAL_TABLET | ORAL | Status: DC | PRN

## 2020-07-10 MED ORDER — BACITRACIN ZINC 500 UNIT/GM EX OINT
TOPICAL_OINTMENT | CUTANEOUS | Status: AC
  Filled 2020-07-10: qty 28.35

## 2020-07-10 MED ORDER — GLYCOPYRROLATE PF 0.2 MG/ML IJ SOSY
PREFILLED_SYRINGE | INTRAMUSCULAR | Status: AC
  Filled 2020-07-10: qty 1

## 2020-07-10 MED ORDER — DOCUSATE SODIUM 100 MG PO CAPS
100.0000 mg | ORAL_CAPSULE | Freq: Two times a day (BID) | ORAL | Status: DC
  Administered 2020-07-10 – 2020-07-11 (×2): 100 mg via ORAL
  Filled 2020-07-10 (×2): qty 1

## 2020-07-10 MED ORDER — DIPHENHYDRAMINE HCL 50 MG/ML IJ SOLN
INTRAMUSCULAR | Status: AC
  Filled 2020-07-10: qty 1

## 2020-07-10 MED ORDER — OXYCODONE HCL 5 MG/5ML PO SOLN: 5.0000 mg | Freq: Once | ORAL | Status: AC | PRN

## 2020-07-10 MED ORDER — PHENYLEPHRINE 40 MCG/ML (10ML) SYRINGE FOR IV PUSH (FOR BLOOD PRESSURE SUPPORT)
PREFILLED_SYRINGE | INTRAVENOUS | Status: DC | PRN
  Administered 2020-07-10 (×3): 40 ug via INTRAVENOUS
  Administered 2020-07-10: 80 ug via INTRAVENOUS
  Administered 2020-07-10 (×4): 40 ug via INTRAVENOUS

## 2020-07-10 MED ORDER — EPHEDRINE SULFATE-NACL 50-0.9 MG/10ML-% IV SOSY
PREFILLED_SYRINGE | INTRAVENOUS | Status: DC | PRN
  Administered 2020-07-10 (×2): 5 mg via INTRAVENOUS
  Administered 2020-07-10: 10 mg via INTRAVENOUS
  Administered 2020-07-10: 5 mg via INTRAVENOUS
  Administered 2020-07-10: 10 mg via INTRAVENOUS
  Administered 2020-07-10: 5 mg via INTRAVENOUS
  Administered 2020-07-10: 10 mg via INTRAVENOUS

## 2020-07-10 MED ORDER — CHLORHEXIDINE GLUCONATE CLOTH 2 % EX PADS: 6.0000 | MEDICATED_PAD | Freq: Once | CUTANEOUS | Status: DC

## 2020-07-10 MED ORDER — SODIUM CHLORIDE 0.9% FLUSH: 3.0000 mL | INTRAVENOUS | Status: DC | PRN

## 2020-07-10 MED ORDER — FENTANYL CITRATE (PF) 250 MCG/5ML IJ SOLN
INTRAMUSCULAR | Status: AC
  Filled 2020-07-10: qty 5

## 2020-07-10 MED ORDER — ONDANSETRON HCL 4 MG/2ML IJ SOLN
INTRAMUSCULAR | Status: DC | PRN
  Administered 2020-07-10: 4 mg via INTRAVENOUS

## 2020-07-10 MED ORDER — ATORVASTATIN CALCIUM 10 MG PO TABS: 20.0000 mg | ORAL_TABLET | ORAL | Status: DC

## 2020-07-10 MED ORDER — VANCOMYCIN HCL IN DEXTROSE 1-5 GM/200ML-% IV SOLN
1000.0000 mg | INTRAVENOUS | Status: AC
  Administered 2020-07-10: 500 mg via INTRAVENOUS
  Administered 2020-07-10: 1000 mg via INTRAVENOUS
  Filled 2020-07-10: qty 200

## 2020-07-10 MED ORDER — ONDANSETRON HCL 4 MG/2ML IJ SOLN
INTRAMUSCULAR | Status: AC
  Filled 2020-07-10: qty 2

## 2020-07-10 MED ORDER — BUPIVACAINE LIPOSOME 1.3 % IJ SUSP
INTRAMUSCULAR | Status: AC
  Filled 2020-07-10: qty 20

## 2020-07-10 MED ORDER — ACETAMINOPHEN 10 MG/ML IV SOLN
INTRAVENOUS | Status: DC | PRN
  Administered 2020-07-10: 1000 mg via INTRAVENOUS

## 2020-07-10 MED ORDER — BUPIVACAINE-EPINEPHRINE (PF) 0.5% -1:200000 IJ SOLN
INTRAMUSCULAR | Status: DC | PRN
  Administered 2020-07-10: 10 mL via PERINEURAL

## 2020-07-10 MED ORDER — ZOLPIDEM TARTRATE 5 MG PO TABS: 5.0000 mg | ORAL_TABLET | Freq: Every evening | ORAL | Status: DC | PRN

## 2020-07-10 MED ORDER — PHENYLEPHRINE 40 MCG/ML (10ML) SYRINGE FOR IV PUSH (FOR BLOOD PRESSURE SUPPORT)
PREFILLED_SYRINGE | INTRAVENOUS | Status: AC
  Filled 2020-07-10: qty 10

## 2020-07-10 MED ORDER — THROMBIN 5000 UNITS EX SOLR
OROMUCOSAL | Status: DC | PRN
  Administered 2020-07-10 (×3): 5 mL via TOPICAL

## 2020-07-10 MED ORDER — EPHEDRINE 5 MG/ML INJ
INTRAVENOUS | Status: AC
  Filled 2020-07-10: qty 10

## 2020-07-10 MED ORDER — ONDANSETRON HCL 4 MG PO TABS: 4.0000 mg | ORAL_TABLET | Freq: Four times a day (QID) | ORAL | Status: DC | PRN

## 2020-07-10 MED ORDER — CYCLOBENZAPRINE HCL 10 MG PO TABS
10.0000 mg | ORAL_TABLET | Freq: Three times a day (TID) | ORAL | Status: DC | PRN
  Administered 2020-07-10: 10 mg via ORAL
  Filled 2020-07-10: qty 1

## 2020-07-10 MED ORDER — BACITRACIN ZINC 500 UNIT/GM EX OINT
TOPICAL_OINTMENT | CUTANEOUS | Status: DC | PRN
  Administered 2020-07-10: 1 via TOPICAL

## 2020-07-10 MED ORDER — CEFAZOLIN SODIUM-DEXTROSE 2-4 GM/100ML-% IV SOLN
2.0000 g | Freq: Once | INTRAVENOUS | Status: AC
  Administered 2020-07-10 (×2): 2 g via INTRAVENOUS

## 2020-07-10 MED ORDER — SODIUM CHLORIDE 0.9% FLUSH: 3.0000 mL | Freq: Two times a day (BID) | INTRAVENOUS | Status: DC

## 2020-07-10 MED ORDER — WHITE PETROLATUM EX OINT
TOPICAL_OINTMENT | CUTANEOUS | Status: AC
  Filled 2020-07-10: qty 28.35

## 2020-07-10 MED ORDER — FENTANYL CITRATE (PF) 100 MCG/2ML IJ SOLN: 25.0000 ug | INTRAMUSCULAR | Status: DC | PRN

## 2020-07-10 MED ORDER — VITAMIN D 25 MCG (1000 UNIT) PO TABS
2000.0000 [IU] | ORAL_TABLET | Freq: Every day | ORAL | Status: DC
  Administered 2020-07-10 – 2020-07-11 (×2): 2000 [IU] via ORAL
  Filled 2020-07-10 (×2): qty 2

## 2020-07-10 MED ORDER — DEXAMETHASONE SODIUM PHOSPHATE 10 MG/ML IJ SOLN
INTRAMUSCULAR | Status: DC | PRN
  Administered 2020-07-10: 4 mg via INTRAVENOUS

## 2020-07-10 MED ORDER — PHENOL 1.4 % MT LIQD: 1.0000 | OROMUCOSAL | Status: DC | PRN

## 2020-07-10 MED ORDER — MIDAZOLAM HCL 2 MG/2ML IJ SOLN
INTRAMUSCULAR | Status: AC
  Filled 2020-07-10: qty 2

## 2020-07-10 MED ORDER — ACETAMINOPHEN 500 MG PO TABS
1000.0000 mg | ORAL_TABLET | Freq: Four times a day (QID) | ORAL | Status: DC
  Administered 2020-07-10: 1000 mg via ORAL
  Filled 2020-07-10: qty 2

## 2020-07-10 MED ORDER — ROCURONIUM BROMIDE 10 MG/ML (PF) SYRINGE
PREFILLED_SYRINGE | INTRAVENOUS | Status: AC
  Filled 2020-07-10: qty 10

## 2020-07-10 MED ORDER — OXYCODONE HCL 5 MG PO TABS
5.0000 mg | ORAL_TABLET | Freq: Once | ORAL | Status: AC | PRN
Start: 2020-07-10 — End: 2020-07-10
  Administered 2020-07-10: 5 mg via ORAL

## 2020-07-10 MED ORDER — MENTHOL 3 MG MT LOZG: 1.0000 | LOZENGE | OROMUCOSAL | Status: DC | PRN

## 2020-07-10 MED ORDER — DILTIAZEM HCL ER COATED BEADS 120 MG PO CP24
120.0000 mg | ORAL_CAPSULE | Freq: Every day | ORAL | Status: DC
  Administered 2020-07-11: 120 mg via ORAL
  Filled 2020-07-10: qty 1

## 2020-07-10 MED ORDER — OXYCODONE-ACETAMINOPHEN 5-325 MG PO TABS
1.0000 | ORAL_TABLET | ORAL | Status: DC | PRN
Start: 2020-07-10 — End: 2020-07-11
  Administered 2020-07-10 – 2020-07-11 (×5): 2 via ORAL
  Filled 2020-07-10 (×5): qty 2

## 2020-07-10 MED ORDER — MAGNESIUM OXIDE -MG SUPPLEMENT 400 (240 MG) MG PO TABS
200.0000 mg | ORAL_TABLET | Freq: Every day | ORAL | Status: DC
  Administered 2020-07-10 – 2020-07-11 (×2): 200 mg via ORAL
  Filled 2020-07-10 (×2): qty 1

## 2020-07-10 MED ORDER — 0.9 % SODIUM CHLORIDE (POUR BTL) OPTIME
TOPICAL | Status: DC | PRN
  Administered 2020-07-10: 1000 mL

## 2020-07-10 MED ORDER — OXYCODONE HCL 5 MG PO TABS: 5.0000 mg | ORAL_TABLET | ORAL | Status: DC | PRN

## 2020-07-10 MED ORDER — SOOTHE XP OP SOLN: Freq: Every day | OPHTHALMIC | Status: DC | PRN

## 2020-07-10 MED ORDER — POLYVINYL ALCOHOL 1.4 % OP SOLN
1.0000 [drp] | OPHTHALMIC | Status: DC | PRN
  Filled 2020-07-10: qty 15

## 2020-07-10 MED ORDER — CEFAZOLIN SODIUM-DEXTROSE 2-4 GM/100ML-% IV SOLN
2.0000 g | Freq: Three times a day (TID) | INTRAVENOUS | Status: AC
  Administered 2020-07-10 – 2020-07-11 (×2): 2 g via INTRAVENOUS
  Filled 2020-07-10 (×2): qty 100

## 2020-07-10 MED ORDER — MORPHINE SULFATE (PF) 4 MG/ML IV SOLN: 4.0000 mg | INTRAVENOUS | Status: DC | PRN

## 2020-07-10 MED ORDER — LORAZEPAM 0.5 MG PO TABS
0.5000 mg | ORAL_TABLET | Freq: Two times a day (BID) | ORAL | Status: DC
  Administered 2020-07-10 – 2020-07-11 (×2): 0.5 mg via ORAL
  Filled 2020-07-10 (×2): qty 1

## 2020-07-10 MED ORDER — LIDOCAINE 2% (20 MG/ML) 5 ML SYRINGE
INTRAMUSCULAR | Status: AC
  Filled 2020-07-10: qty 5

## 2020-07-10 MED ORDER — BUPIVACAINE-EPINEPHRINE 0.5% -1:200000 IJ SOLN
INTRAMUSCULAR | Status: AC
  Filled 2020-07-10: qty 1

## 2020-07-10 MED ORDER — MAGNESIUM 250 MG PO TABS: 250.0000 mg | ORAL_TABLET | Freq: Every day | ORAL | Status: DC

## 2020-07-10 MED ORDER — BUPIVACAINE LIPOSOME 1.3 % IJ SUSP
INTRAMUSCULAR | Status: DC | PRN
  Administered 2020-07-10: 20 mL

## 2020-07-10 MED ORDER — SUGAMMADEX SODIUM 200 MG/2ML IV SOLN
INTRAVENOUS | Status: DC | PRN
  Administered 2020-07-10: 100 mg via INTRAVENOUS

## 2020-07-10 MED ORDER — LACTATED RINGERS IV SOLN: INTRAVENOUS | Status: DC

## 2020-07-10 MED ORDER — ONDANSETRON HCL 4 MG/2ML IJ SOLN
4.0000 mg | Freq: Four times a day (QID) | INTRAMUSCULAR | Status: AC | PRN
  Administered 2020-07-10: 4 mg via INTRAVENOUS

## 2020-07-10 SURGICAL SUPPLY — 65 items
BAG COUNTER SPONGE SURGICOUNT (BAG) ×4 IMPLANT
BASKET BONE COLLECTION (BASKET) ×2 IMPLANT
BENZOIN TINCTURE PRP APPL 2/3 (GAUZE/BANDAGES/DRESSINGS) ×2 IMPLANT
BLADE CLIPPER SURG (BLADE) IMPLANT
BUR MATCHSTICK NEURO 3.0 LAGG (BURR) ×2 IMPLANT
BUR PRECISION FLUTE 6.0 (BURR) ×4 IMPLANT
CAGE ALTERA 8X12-8 (Cage) ×2 IMPLANT
CANISTER SUCT 3000ML PPV (MISCELLANEOUS) ×2 IMPLANT
CAP REVERE LOCKING (Cap) ×36 IMPLANT
CARTRIDGE OIL MAESTRO DRILL (MISCELLANEOUS) ×1 IMPLANT
CNTNR URN SCR LID CUP LEK RST (MISCELLANEOUS) ×1 IMPLANT
CONT SPEC 4OZ STRL OR WHT (MISCELLANEOUS) ×1
COVER BACK TABLE 60X90IN (DRAPES) ×2 IMPLANT
DECANTER SPIKE VIAL GLASS SM (MISCELLANEOUS) IMPLANT
DIFFUSER DRILL AIR PNEUMATIC (MISCELLANEOUS) ×2 IMPLANT
DRAPE C-ARM 42X72 X-RAY (DRAPES) ×4 IMPLANT
DRAPE HALF SHEET 40X57 (DRAPES) ×2 IMPLANT
DRAPE LAPAROTOMY 100X72X124 (DRAPES) ×2 IMPLANT
DRAPE SURG 17X23 STRL (DRAPES) ×8 IMPLANT
DRSG OPSITE POSTOP 4X12 (GAUZE/BANDAGES/DRESSINGS) ×2 IMPLANT
DRSG OPSITE POSTOP 4X6 (GAUZE/BANDAGES/DRESSINGS) ×2 IMPLANT
ELECT BLADE 4.0 EZ CLEAN MEGAD (MISCELLANEOUS) ×2
ELECT REM PT RETURN 9FT ADLT (ELECTROSURGICAL) ×2
ELECTRODE BLDE 4.0 EZ CLN MEGD (MISCELLANEOUS) ×1 IMPLANT
ELECTRODE REM PT RTRN 9FT ADLT (ELECTROSURGICAL) ×1 IMPLANT
EVACUATOR 1/8 PVC DRAIN (DRAIN) ×2 IMPLANT
GAUZE 4X4 16PLY ~~LOC~~+RFID DBL (SPONGE) ×4 IMPLANT
GLOVE EXAM NITRILE XL STR (GLOVE) IMPLANT
GLOVE SURG ENC MOIS LTX SZ8 (GLOVE) ×4 IMPLANT
GLOVE SURG ENC MOIS LTX SZ8.5 (GLOVE) ×4 IMPLANT
GOWN STRL REUS W/ TWL LRG LVL3 (GOWN DISPOSABLE) IMPLANT
GOWN STRL REUS W/ TWL XL LVL3 (GOWN DISPOSABLE) ×2 IMPLANT
GOWN STRL REUS W/TWL 2XL LVL3 (GOWN DISPOSABLE) IMPLANT
GOWN STRL REUS W/TWL LRG LVL3 (GOWN DISPOSABLE)
GOWN STRL REUS W/TWL XL LVL3 (GOWN DISPOSABLE) ×2
GRAFT TRINITY ELITE LGE HUMAN (Tissue) ×2 IMPLANT
HEMOSTAT POWDER KIT SURGIFOAM (HEMOSTASIS) ×6 IMPLANT
KIT BASIN OR (CUSTOM PROCEDURE TRAY) ×2 IMPLANT
KIT TURNOVER KIT B (KITS) ×2 IMPLANT
MILL MEDIUM DISP (BLADE) IMPLANT
NEEDLE HYPO 21X1.5 SAFETY (NEEDLE) IMPLANT
NEEDLE HYPO 22GX1.5 SAFETY (NEEDLE) ×2 IMPLANT
NS IRRIG 1000ML POUR BTL (IV SOLUTION) ×2 IMPLANT
OIL CARTRIDGE MAESTRO DRILL (MISCELLANEOUS) ×2
PACK LAMINECTOMY NEURO (CUSTOM PROCEDURE TRAY) ×2 IMPLANT
PAD ARMBOARD 7.5X6 YLW CONV (MISCELLANEOUS) ×6 IMPLANT
PATTIES SURGICAL .5 X.5 (GAUZE/BANDAGES/DRESSINGS) ×2 IMPLANT
PATTIES SURGICAL .5 X1 (DISPOSABLE) IMPLANT
PATTIES SURGICAL 1X1 (DISPOSABLE) ×2 IMPLANT
ROD 6.35 HEX END TITAN 300 (Rod) ×2 IMPLANT
ROD 6.35 HEX END TITAN 500 (Rod) ×2 IMPLANT
SCREW REVERE 6.35 6.5MMX45 (Screw) ×2 IMPLANT
SCREW REVERE 6.35 6.5X40MM (Screw) ×16 IMPLANT
SPONGE NEURO XRAY DETECT 1X3 (DISPOSABLE) IMPLANT
SPONGE SURGIFOAM ABS GEL 100 (HEMOSTASIS) IMPLANT
SPONGE T-LAP 4X18 ~~LOC~~+RFID (SPONGE) ×4 IMPLANT
STRIP CLOSURE SKIN 1/2X4 (GAUZE/BANDAGES/DRESSINGS) ×8 IMPLANT
SUT VIC AB 1 CT1 18XBRD ANBCTR (SUTURE) ×4 IMPLANT
SUT VIC AB 1 CT1 8-18 (SUTURE) ×4
SUT VIC AB 2-0 CP2 18 (SUTURE) ×8 IMPLANT
SYR 20ML LL LF (SYRINGE) IMPLANT
TOWEL GREEN STERILE (TOWEL DISPOSABLE) ×2 IMPLANT
TOWEL GREEN STERILE FF (TOWEL DISPOSABLE) ×2 IMPLANT
TRAY FOLEY MTR SLVR 16FR STAT (SET/KITS/TRAYS/PACK) ×2 IMPLANT
WATER STERILE IRR 1000ML POUR (IV SOLUTION) ×2 IMPLANT

## 2020-07-10 NOTE — Anesthesia Postprocedure Evaluation (Signed)
Anesthesia Post Note  Patient: Carrie Austin  Procedure(s) Performed: Posterior Lumbar Interbody Fusion, Interbody Prosthesis, Lumbar One-Lumbar Two; Extend Instrumentation To Thoracic Ten     Patient location during evaluation: PACU Anesthesia Type: General Level of consciousness: awake and alert Pain management: pain level controlled Vital Signs Assessment: post-procedure vital signs reviewed and stable Respiratory status: spontaneous breathing, nonlabored ventilation, respiratory function stable and patient connected to nasal cannula oxygen Cardiovascular status: blood pressure returned to baseline and stable Postop Assessment: no apparent nausea or vomiting Anesthetic complications: no   No notable events documented.  Last Vitals:  Vitals:   07/10/20 1450 07/10/20 1505  BP: (!) 126/55 112/61  Pulse: 89 87  Resp: 13 18  Temp:    SpO2: 100% 100%    Last Pain:  Vitals:   07/10/20 1505  TempSrc:   PainSc: 5                  Gardiner Espana S

## 2020-07-10 NOTE — Op Note (Signed)
Brief history: The patient is a 75 year old white female on whom I previously performed lumbar fusions.  She has done well until recently when she developed increasing back and right greater left pain.  She failed medical management and was worked up with a lumbar myelo CT which demonstrated adjacent segment degenerative changes stenosis at L1-2.  I discussed the various treatment options with her.  She has decided proceed with surgery.  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 Etc. requiring a wide decompression of the nerve roots.);  L1 to transforaminal lumbar interbody fusion with local morselized autograft bone and Orthofix Trinity Elite; insertion of interbody prosthesis at L1 to (globus peek expandable interbody prosthesis); posterior segmental instrumentation from T10 to S1 with globus titanium pedicle screws and rods; posterior lateral arthrodesis at T10-11, T11-12, T12-L1 and L1-2, and redo posterior arthrodesis at L2-3, L3-4, L4-5 and L5-S1 with local morselized autograft bone and Orthofix USAA; exploration of lumbar fusion/removal of lumbar hardware  Surgeon: Dr. Delma Officer  Asst.: Dr. Coletta Memos and Hildred Priest, NP  Anesthesia: Gen. endotracheal  Estimated blood loss: 500 cc  Drains: None  Complications: None  Description of procedure: The patient was brought to the operating room by the anesthesia team. General endotracheal anesthesia was induced. The patient was turned to the prone position on  the Wilson frame. The patient's lumbosacral region was then prepared with Betadine scrub and Betadine solution. Sterile drapes were applied.  I then injected the area to be incised with Marcaine with epinephrine solution. I then used the scalpel to make a linear midline incision from T10 to the sacrum, incising through the old surgical scar. I then used electrocautery to perform a bilateral subperiosteal dissection exposing the spinous process and lamina of T10 down to the sacrum and exposing the old hardware. We then inserted the Verstrac retractor to provide exposure.  We explored the fusion by removing the caps from the old screws from L2 to the sacrum.  We then remove the rods.  I inspected the posterior lateral arthrodesis at L2-3, L3-4, L4-5 and L5-S1.  I could not identify a clear bony fusion.  I began the decompression by using the high speed drill to perform laminotomies at L1-2 bilaterally. We then used the Kerrison punches to widen the laminotomy and removed the ligamentum flavum at L1-2 bilaterally. We used the Kerrison punches to remove the medial facets at L1-2 bilaterally, we removed the right L1-2 facet. We performed wide foraminotomies about the bilateral L1 and L2 nerve roots completing the decompression.  We now turned our attention to the posterior lumbar interbody fusion. I used a scalpel to incise the intervertebral disc at L1-2 bilaterally, lateral to the thecal sac.. I then performed a partial intervertebral discectomy at 1 2 bilaterally using the pituitary forceps. We prepared the vertebral endplates at L1-2 bilaterally for the fusion by removing the soft tissues with the curettes. We then used the trial spacers to pick the appropriate sized interbody prosthesis. We prefilled his prosthesis with a combination of local morselized autograft bone that we obtained during the decompression as well as Orthofix USAA. We inserted  the prefilled prosthesis into the interspace at L1-2  from the right, we then turned and expanded the prosthesis. There was a good snug fit of the prosthesis in the interspace. We then filled and the remainder of the intervertebral disc space with local morselized autograft bone and Orthofix USAA. This completed the posterior lumbar interbody arthrodesis.  During the decompression and insertion of the prosthesis the assistant protected the thecal sac and nerve roots with the D'Errico retractor.  We now turned attention to the instrumentation. Under fluoroscopic guidance we cannulated the bilateral T10, T11, T12, L1 pedicles with the bone probe. We then removed the bone probe. We then tapped the pedicle with a 5.5 millimeter tap. We then removed the tap. We probed inside the tapped pedicle with a ball probe to rule out cortical breaches. We then inserted a 6.5 x 40 millimeter pedicle screw into the T10, T11, T12 and L1 pedicles bilaterally under fluoroscopic guidance. We then palpated along the medial aspect of the pedicles to rule out cortical breaches. There were none. The nerve roots were not injured. We then connected the unilateral pedicle screws with a lordotic rod. We compressed the construct and secured the rod in place with the caps. We then tightened the caps appropriately. This completed the instrumentation from T10-S1 bilaterally.  We now turned our attention to the posterior lateral arthrodesis . We used the high-speed drill to decorticate the remainder of the facets, pars, transverse process at T10-11, T11-12, T12-L1 and L1-2.  We also cleared the soft tissue at L2-3, L3-4, L4-5 and L5-S1.  We used a high-speed drill to decorticate the posterior lateral structures at these levels.  We then applied a combination of local morselized autograft bone and Orthofix Trinity Elite over these decorticated posterior lateral structures at T10-11, T11-12, T12-L1, L1-2, L2-3, L3-4, L4-5 and L5-S1. This completed the posterior lateral arthrodesis.  We then  obtained hemostasis using bipolar electrocautery. We irrigated the wound out with bacitracin solution. We inspected the thecal sac and nerve roots and noted they were well decompressed. We then removed the retractor.  We injected Exparel .  We placed a medium Hemovac drain in the epidural space and tunneled out through a separate stab wound.  We reapproximated patient's thoracolumbar fascia with interrupted #1 Vicryl suture. We reapproximated patient's subcutaneous tissue with interrupted 2-0 Vicryl suture. The reapproximated patient's skin with Steri-Strips and benzoin. The wound was then coated with bacitracin ointment. A sterile dressing was applied. The drapes were removed. The patient was subsequently returned to the supine position where they were extubated by the anesthesia team. He was then transported to the post anesthesia care unit in stable condition. All sponge instrument and needle counts were reportedly correct at the end of this case.

## 2020-07-10 NOTE — Transfer of Care (Signed)
Immediate Anesthesia Transfer of Care Note  Patient: Carrie Austin  Procedure(s) Performed: Posterior Lumbar Interbody Fusion, Interbody Prosthesis, Lumbar One-Lumbar Two; Extend Instrumentation To Thoracic Ten  Patient Location: PACU  Anesthesia Type:General  Level of Consciousness: oriented, drowsy and patient cooperative  Airway & Oxygen Therapy: Patient Spontanous Breathing and Patient connected to face mask oxygen  Post-op Assessment: Report given to RN and Post -op Vital signs reviewed and stable  Post vital signs: Reviewed  Last Vitals:  Vitals Value Taken Time  BP 110/55 07/10/20 1405  Temp 36.7 C 07/10/20 1405  Pulse 95 07/10/20 1411  Resp 16 07/10/20 1411  SpO2 100 % 07/10/20 1411  Vitals shown include unvalidated device data.  Last Pain:  Vitals:   07/10/20 0605  TempSrc:   PainSc: 5       Patients Stated Pain Goal: 2 (07/10/20 0539)  Complications: No notable events documented.

## 2020-07-10 NOTE — H&P (Signed)
Subjective: The patient is a 75 year old white female on whom I previously performed lumbar fusions.  She has done well for years but has developed recurrent back and lower extremity pain.  She has failed medical management.  She was worked up with a lumbar myelo CT which demonstrated severe disc degeneration and moderate stenosis at L1-2.  I discussed the various treatment options.  She has decided proceed with surgery.  Past Medical History:  Diagnosis Date   Allergy    Anxiety    Arthritis    Cough    due allergies chronic   Depression    Dilatation of esophagus    Fibromyalgia    GERD (gastroesophageal reflux disease)    Headache(784.0)    migraines   Heart murmur    one time   Hypertension    PONV (postoperative nausea and vomiting)    hx of Vertigo after anesthesia    Past Surgical History:  Procedure Laterality Date   ABDOMINAL HYSTERECTOMY     BACK SURGERY  12/21/2012   CHOLECYSTECTOMY     COLONOSCOPY     DIAGNOSTIC LAPAROSCOPY     lap chole   EYE SURGERY Bilateral    lasik   TUBAL LIGATION      Allergies  Allergen Reactions   Lisinopril Cough    Deep cough   Amoxicillin Rash    Has patient had a PCN reaction causing immediate rash, facial/tongue/throat swelling, SOB or lightheadedness with hypotension:unsure Has patient had a PCN reaction causing severe rash involving mucus membranes or skin necrosis:unsure Has patient had a PCN reaction that required hospitalization:NO  . Has patient had a PCN reaction occurring within the last 10 years:    Yes If all of the above answers are "NO", then may proceed with Cephalosporin use.    Cephalosporins Other (See Comments)    Flushing of the face    Social History   Tobacco Use   Smoking status: Never   Smokeless tobacco: Never  Substance Use Topics   Alcohol use: No    Comment: social    History reviewed. No pertinent family history. Prior to Admission medications   Medication Sig Start Date End Date Taking?  Authorizing Provider  Artificial Tear Solution (SOOTHE XP OP) Place 1 drop into both eyes daily as needed (dry eyes).   Yes [provider]  atorvastatin (LIPITOR) 20 MG tablet Take 20 mg by mouth every Wednesday.    Yes [provider]  Calcium-Vitamin D-Vitamin K (VIACTIV PO) Take 1 tablet by mouth daily.   Yes [provider]  camphor-menthol Wynelle Fanny) lotion Apply 1 application topically daily.   Yes [provider]  Cholecalciferol (VITAMIN D) 2000 UNITS CAPS Take 2,000 Units by mouth daily.   Yes [provider]  COD LIVER OIL PO Take 5 mLs by mouth daily.   Yes [provider]  cyanocobalamin 1000 MCG tablet Take 1,000 mcg by mouth daily.   Yes [provider]  diclofenac Sodium (VOLTAREN) 1 % GEL Apply 2 g topically in the morning.   Yes [provider]  diltiazem (CARDIZEM CD) 120 MG 24 hr capsule Take 120 mg by mouth daily. 04/22/20  Yes [provider]  docusate sodium (COLACE) 100 MG capsule Take 100 mg by mouth 2 (two) times daily.   Yes [provider]  escitalopram (LEXAPRO) 10 MG tablet Take 2 tablets (20 mg total) by mouth daily. 06/13/20  Yes Plovsky, Earvin Hansen, MD  folic acid (FOLVITE) 1 MG tablet  Take 1 mg by mouth daily.   Yes [provider]  hydrochlorothiazide (HYDRODIURIL) 25 MG tablet Take 25 mg by mouth daily. 06/07/20  Yes [provider]  LORazepam (ATIVAN) 0.5 MG tablet 1 bid Patient taking differently: Take 0.5 mg by mouth 2 (two) times daily. 06/13/20  Yes Plovsky, Earvin Hansen, MD  Magnesium 250 MG TABS Take 250 mg by mouth daily.   Yes [provider]  meloxicam (MOBIC) 7.5 MG tablet Take 7.5 mg by mouth in the morning and at bedtime. 09/26/19  Yes [provider]  methotrexate (RHEUMATREX) 2.5 MG tablet Take 15 mg by mouth every Wednesday.   Yes [provider]  oxyCODONE-acetaminophen (PERCOCET/ROXICET) 5-325 MG tablet Take 1 tablet by mouth every  6 (six) hours as needed for moderate pain.   Yes [provider]  Propylene Glycol 0.6 % SOLN Place 1 drop into both eyes in the morning and at bedtime.   Yes [provider]     Review of Systems  Positive ROS: As above  All other systems have been reviewed and were otherwise negative with the exception of those mentioned in the HPI and as above.  Objective: Vital signs in last 24 hours: Temp:  [98.1 F (36.7 C)] 98.1 F (36.7 C) (06/30 0546) Pulse Rate:  [50] 50 (06/30 0546) Resp:  [18] 18 (06/30 0546) BP: (142)/(48) 142/48 (06/30 0546) SpO2:  [99 %] 99 % (06/30 0546) Weight:  [42 kg] 42 kg (06/30 0546) Estimated body mass index is 18.08 kg/m as calculated from the following:   Height as of this encounter: 5' (1.524 m).   Weight as of this encounter: 42 kg.   General Appearance: Alert Head: Normocephalic, without obvious abnormality, atraumatic Eyes: PERRL, conjunctiva/corneas clear, EOM's intact,    Ears: Normal  Throat: Normal  Neck: Supple, Back: Her lumbar incision is well-healed. Lungs: Clear to auscultation bilaterally, respirations unlabored Heart: Regular rate and rhythm, no murmur, rub or gallop Abdomen: Soft, non-tender Extremities: Extremities normal, atraumatic, no cyanosis or edema Skin: unremarkable  NEUROLOGIC:   Mental status: alert and oriented,Motor Exam - grossly normal Sensory Exam - grossly normal Reflexes:  Coordination - grossly normal Gait - grossly normal Balance - grossly normal Cranial Nerves: I: smell Not tested  II: visual acuity  OS: Normal  OD: Normal   II: visual fields Full to confrontation  II: pupils Equal, round, reactive to light  III,VII: ptosis None  III,IV,VI: extraocular muscles  Full ROM  V: mastication Normal  V: facial light touch sensation  Normal  V,VII: corneal reflex  Present  VII: facial muscle function - upper  Normal  VII: facial muscle function - lower Normal  VIII: hearing Not tested   IX: soft palate elevation  Normal  IX,X: gag reflex Present  XI: trapezius strength  5/5  XI: sternocleidomastoid strength 5/5  XI: neck flexion strength  5/5  XII: tongue strength  Normal    Data Review Lab Results  Component Value Date   WBC 6.4 07/07/2020   HGB 13.2 07/07/2020   HCT 37.4 07/07/2020   MCV 88.4 07/07/2020   PLT 226 07/07/2020   Lab Results  Component Value Date   NA 132 (L) 07/07/2020   K 3.3 (L) 07/07/2020   CL 95 (L) 07/07/2020   CO2 27 07/07/2020   BUN 11 07/07/2020   CREATININE 0.68 07/07/2020   GLUCOSE 106 (H) 07/07/2020   No results found for: INR, PROTIME  Assessment/Plan: Lumbar degenerative disease, spinal stenosis,  lumbago, lumbar radiculopathy: I have discussed the situation with the patient.  I have reviewed her imaging studies with her and pointed out the abnormalities.  We have discussed the various treatment options including surgery.  I have described a decompression and extension of her lumbar fusion.  I have shown her surgical models.  I have given her a surgical pamphlet.  We have discussed the risk, benefits, alternatives, expected postop course, and likelihood of achieving our goals with surgery.  I have answered all her questions.  She has decided proceed with surgery.   [name] 07/10/2020 7:27 AM

## 2020-07-10 NOTE — Anesthesia Procedure Notes (Signed)
Procedure Name: Intubation Date/Time: 07/10/2020 7:58 AM Performed by: Lovie Chol, CRNA Pre-anesthesia Checklist: Patient identified, Emergency Drugs available, Suction available and Patient being monitored Patient Re-evaluated:Patient Re-evaluated prior to induction Oxygen Delivery Method: Circle System Utilized Preoxygenation: Pre-oxygenation with 100% oxygen Induction Type: IV induction Ventilation: Mask ventilation without difficulty Laryngoscope Size: Miller and 2 Grade View: Grade I Tube type: Oral Tube size: 7.0 mm Number of attempts: 1 Airway Equipment and Method: Stylet Placement Confirmation: ETT inserted through vocal cords under direct vision, positive ETCO2 and breath sounds checked- equal and bilateral Secured at: 20 cm Tube secured with: Tape Dental Injury: Teeth and Oropharynx as per pre-operative assessment

## 2020-07-11 LAB — BASIC METABOLIC PANEL
Anion gap: 7 (ref 5–15)
BUN: 8 mg/dL (ref 8–23)
CO2: 30 mmol/L (ref 22–32)
Calcium: 8.2 mg/dL — ABNORMAL LOW (ref 8.9–10.3)
Chloride: 92 mmol/L — ABNORMAL LOW (ref 98–111)
Creatinine, Ser: 0.74 mg/dL (ref 0.44–1.00)
GFR, Estimated: >60 mL/min (ref >60)
Glucose, Bld: 162 mg/dL — ABNORMAL HIGH (ref 70–99)
Potassium: 3.4 mmol/L — ABNORMAL LOW (ref 3.5–5.1)
Sodium: 129 mmol/L — ABNORMAL LOW (ref 135–145)

## 2020-07-11 LAB — CBC
HCT: 24.5 % — ABNORMAL LOW (ref 36.0–46.0)
Hemoglobin: 8.6 g/dL — ABNORMAL LOW (ref 12.0–15.0)
MCH: 31.2 pg (ref 26.0–34.0)
MCHC: 35.1 g/dL (ref 30.0–36.0)
MCV: 88.8 fL (ref 80.0–100.0)
Platelets: 164 10*3/uL (ref 150–400)
RBC: 2.76 MIL/uL — ABNORMAL LOW (ref 3.87–5.11)
RDW: 12.9 % (ref 11.5–15.5)
WBC: 10.1 10*3/uL (ref 4.0–10.5)
nRBC: 0 % (ref 0.0–0.2)

## 2020-07-11 MED ORDER — OXYCODONE-ACETAMINOPHEN 5-325 MG PO TABS: 1.0000 | ORAL_TABLET | ORAL | 0 refills | Status: DC | PRN

## 2020-07-11 MED ORDER — POTASSIUM CHLORIDE CRYS ER 20 MEQ PO TBCR
20.0000 meq | EXTENDED_RELEASE_TABLET | Freq: Two times a day (BID) | ORAL | Status: DC
  Administered 2020-07-11: 20 meq via ORAL
  Filled 2020-07-11: qty 1

## 2020-07-11 MED ORDER — CYCLOBENZAPRINE HCL 10 MG PO TABS: 10.0000 mg | ORAL_TABLET | Freq: Three times a day (TID) | ORAL | 0 refills | Status: DC | PRN

## 2020-07-11 MED ORDER — FERROUS SULFATE 325 (65 FE) MG PO TABS
325.0000 mg | ORAL_TABLET | Freq: Two times a day (BID) | ORAL | Status: DC
  Administered 2020-07-11: 325 mg via ORAL
  Filled 2020-07-11: qty 1

## 2020-07-11 MED FILL — Sodium Chloride IV Soln 0.9%: INTRAVENOUS | Qty: 2000 | Status: AC

## 2020-07-11 MED FILL — Heparin Sodium (Porcine) Inj 1000 Unit/ML: INTRAMUSCULAR | Qty: 1 | Status: AC

## 2020-07-11 NOTE — Progress Notes (Signed)
Orthopedic Tech Progress Note Patient Details:  Carrie Austin [birth year] [id number] Applied TLSO with [name] Devices Type of Ortho Device: Other (comment) Ortho Device/Splint Interventions: Application   Post Interventions Patient Tolerated: Well Instructions Provided: Adjustment of device  [name] 07/11/2020, 9:00 AM

## 2020-07-11 NOTE — Evaluation (Signed)
Physical Therapy Evaluation and Discharge Patient Details Name: Carrie Austin MRN: [id number] DOB: [birth year] Today's Date: 07/11/2020   History of Present Illness  Pt is a 75 y/o female s/p L1-2 fusion on 07/10/2020. PMH including anxiety, arthritis, fibromyalgia, HTN, and prior back surgery (2014).   Clinical Impression  Patient evaluated by Physical Therapy with no further acute PT needs identified. All education has been completed and the patient has no further questions. Pt was able to demonstrate transfers and ambulation with gross modified independence and no AD. Pt was educated on precautions, brace application/wearing schedule, appropriate activity progression, and car transfer. See below for any follow-up Physical Therapy or equipment needs. PT is signing off. Thank you for this referral.     Follow Up Recommendations No PT follow up;Supervision for mobility/OOB    Equipment Recommendations  None recommended by PT    Recommendations for Other Services       Precautions / Restrictions Precautions Precautions: Back;Fall Precaution Booklet Issued: Yes (comment) Precaution Comments: Providing handout and education on compensatory techniques Required Braces or Orthoses: Spinal Brace Spinal Brace: Thoracolumbosacral orthotic;Applied in sitting position Restrictions Weight Bearing Restrictions: No      Mobility  Bed Mobility Overal bed mobility: Modified Independent             General bed mobility comments: HOB flat and rails lowered to simulate home environment.    Transfers Overall transfer level: Modified independent Equipment used: None             General transfer comment: Pt was able to power-up to full stand without difficulty. No unsteadiness or LOB noted.  Ambulation/Gait Ambulation/Gait assistance: Modified independent (Device/Increase time) Gait Distance (Feet): 300 Feet Assistive device: None Gait Pattern/deviations: Step-through  pattern;Decreased stride length;Trunk flexed Gait velocity: Decreased Gait velocity interpretation: <1.31 ft/sec, indicative of household ambulator General Gait Details: VC's for improved posture. Pt slow but generally steady without an AD. Initally noted mild drifting however improved with distance.  Stairs         General stair comments: Deferred stairs as pt will not have to negotiate any stairs at her home or significant others home  Wheelchair Mobility    Modified Rankin (Stroke Patients Only)       Balance Overall balance assessment: Mild deficits observed, not formally tested                                           Pertinent Vitals/Pain Pain Assessment: Faces Faces Pain Scale: Hurts a little bit Pain Location: Incision site Pain Descriptors / Indicators: Operative site guarding Pain Intervention(s): Limited activity within patient's tolerance;Monitored during session;Repositioned    Home Living Family/patient expects to be discharged to:: Private residence Living Arrangements: Alone Available Help at Discharge: Family;Available 24 hours/day Type of Home: House Home Access: Level entry     Home Layout: One level Home Equipment: Cane - quad;Cane - single point;Walker - standard      Prior Function Level of Independence: Independent               Hand Dominance   Dominant Hand: Right    Extremity/Trunk Assessment   Upper Extremity Assessment Upper Extremity Assessment: Defer to OT evaluation    Lower Extremity Assessment Lower Extremity Assessment: Generalized weakness (Consistent with pre-op diagnosis)    Cervical / Trunk Assessment Cervical / Trunk Assessment: Other exceptions Cervical / Trunk Exceptions:  s/p lumbar sx  Communication   Communication: No difficulties  Cognition Arousal/Alertness: Awake/alert Behavior During Therapy: WFL for tasks assessed/performed Overall Cognitive Status: Within Functional Limits for  tasks assessed                                        General Comments      Exercises     Assessment/Plan    PT Assessment Patent does not need any further PT services  PT Problem List         PT Treatment Interventions      PT Goals (Current goals can be found in the Care Plan section)  Acute Rehab PT Goals Patient Stated Goal: Go home PT Goal Formulation: All assessment and education complete, DC therapy    Frequency     Barriers to discharge        Co-evaluation               AM-PAC PT "6 Clicks" Mobility  Outcome Measure Help needed turning from your back to your side while in a flat bed without using bedrails?: None Help needed moving from lying on your back to sitting on the side of a flat bed without using bedrails?: None Help needed moving to and from a bed to a chair (including a wheelchair)?: None Help needed standing up from a chair using your arms (e.g., wheelchair or bedside chair)?: None Help needed to walk in hospital room?: None Help needed climbing 3-5 steps with a railing? : A Little 6 Click Score: 23    End of Session Equipment Utilized During Treatment: Gait belt Activity Tolerance: Patient tolerated treatment well Patient left: in bed;with call bell/phone within reach Nurse Communication: Mobility status PT Visit Diagnosis: Unsteadiness on feet (R26.81);Pain Pain - part of body:  (back)    Time: 6301-6010 PT Time Calculation (min) (ACUTE ONLY): 15 min   Charges:   PT Evaluation $PT Eval Low Complexity: 1 Low          Conni Slipper, PT, DPT Acute Rehabilitation Services Pager: [phone] Office: [phone]   Marylynn Pearson 07/11/2020, 10:58 AM

## 2020-07-11 NOTE — Plan of Care (Signed)
Adequately ready for Discharge 

## 2020-07-11 NOTE — Discharge Summary (Signed)
Physician Discharge Summary     Providing Compassionate, Quality Care - Together   Patient ID: Carrie Austin MRN: [id number] DOB/AGE: 04/02/1945 75 y.o.  Admit date: 07/10/2020 Discharge date: 07/11/2020  Admission Diagnoses: Spinal stenosis of lumbar region with neurogenic claudication  Discharge Diagnoses:  Active Problems:   Spinal stenosis of lumbar region with neurogenic claudication   Discharged Condition: good  Hospital Course: Patient underwent an L1-2 PLIF  by Dr. Lovell Sheehan on  07/10/2020.  She was admitted to 3C04 following recovery from anesthesia in the PACU. Her postoperative course has been uncomplicated. She has worked with both physical and occupational therapies who feel the patient is ready for discharge home. She is ambulating independently and without difficulty. She is tolerating a normal diet. She is not having any bowel or bladder dysfunction. Her pain is well-controlled with oral pain medication. She is ready for discharge home.   Consults: None  Significant Diagnostic Studies: radiology: DG THORACOLUMABAR SPINE  Result Date: 07/10/2020 CLINICAL DATA:  Posterior lumbar interbody fusion with interbody prosthesis at L1-L2; extended instrumentation to T10. EXAM: DG C-ARM 1-60 MIN; THORACOLUMBAR SPINE - 2 VIEW FLUOROSCOPY TIME:  Fluoroscopy Time:  23.4 seconds. Radiation Exposure Index (if provided by the fluoroscopic device): 2.2182 mGy. Number of Acquired Spot Images: 2 COMPARISON:  Radiographs July 07, 2020. FINDINGS: Two C-arm fluoroscopic images were obtained intraoperatively and submitted for post operative interpretation. These images demonstrate partially imaged a previous posterior lumbosacral fusion spanning from L2 inferiorly with new/interval placement of bilateral pedicle screws at T10, T11, T12, and L1 with intervening L1-L2 spacer. No unexpected findings or unexpected foreign bodies. Please see the performing provider's procedural report for further  detail. IMPRESSION: Intraoperative fluoroscopy, as detailed above. Electronically Signed   By: Feliberto Harts MD   On: 07/10/2020 12:31   DG C-Arm 1-60 Min  Result Date: 07/10/2020 CLINICAL DATA:  Posterior lumbar interbody fusion with interbody prosthesis at L1-L2; extended instrumentation to T10. EXAM: DG C-ARM 1-60 MIN; THORACOLUMBAR SPINE - 2 VIEW FLUOROSCOPY TIME:  Fluoroscopy Time:  23.4 seconds. Radiation Exposure Index (if provided by the fluoroscopic device): 2.2182 mGy. Number of Acquired Spot Images: 2 COMPARISON:  Radiographs July 07, 2020. FINDINGS: Two C-arm fluoroscopic images were obtained intraoperatively and submitted for post operative interpretation. These images demonstrate partially imaged a previous posterior lumbosacral fusion spanning from L2 inferiorly with new/interval placement of bilateral pedicle screws at T10, T11, T12, and L1 with intervening L1-L2 spacer. No unexpected findings or unexpected foreign bodies. Please see the performing provider's procedural report for further detail. IMPRESSION: Intraoperative fluoroscopy, as detailed above. Electronically Signed   By: Feliberto Harts MD   On: 07/10/2020 12:31     Treatments: surgery: Bilateral L1-2 laminotomy/foraminotomies/medial facetectomy to decompress the bilateral L1 and L2 nerve roots(the work required to do this was in addition to the work required to do the posterior lumbar interbody fusion because of the patient's spinal stenosis, facet arthropathy. Etc. requiring a wide decompression of the nerve roots.);  L1 to transforaminal lumbar interbody fusion with local morselized autograft bone and Orthofix Trinity Elite; insertion of interbody prosthesis at L1 to (globus peek expandable interbody prosthesis); posterior segmental instrumentation from T10 to S1 with globus titanium pedicle screws and rods; posterior lateral arthrodesis at T10-11, T11-12, T12-L1 and L1-2, and redo posterior arthrodesis at L2-3, L3-4, L4-5  and L5-S1 with local morselized autograft bone and Orthofix USAA; exploration of lumbar fusion/removal of lumbar hardware  Discharge Exam: Blood pressure (!) 134/51, pulse 97,  temperature 98.3 F (36.8 C), temperature source Oral, resp. rate 18, height 5' (1.524 m), weight 42 kg, SpO2 99 %.  Per report: Alert and oriented x 4 PERRLA CN II-XII grossly intact MAE, Strength and sensation intact Incision is covered with Honeycomb dressing and Steri Strips; Dressing is clean, dry, and intact   Disposition: Discharge disposition: 01-Home or Self Care        Allergies as of 07/11/2020       Reactions   Lisinopril Cough   Deep cough   Vancomycin Itching   Amoxicillin Rash   Has patient had a PCN reaction causing immediate rash, facial/tongue/throat swelling, SOB or lightheadedness with hypotension:unsure Has patient had a PCN reaction causing severe rash involving mucus membranes or skin necrosis:unsure Has patient had a PCN reaction that required hospitalization:NO  . Has patient had a PCN reaction occurring within the last 10 years:    Yes If all of the above answers are "NO", then may proceed with Cephalosporin use.   Cephalosporins Other (See Comments)   Flushing of the face        Medication List     TAKE these medications    atorvastatin 20 MG tablet Commonly known as: LIPITOR Take 20 mg by mouth every Wednesday.   camphor-menthol lotion Commonly known as: SARNA Apply 1 application topically daily.   COD LIVER OIL PO Take 5 mLs by mouth daily.   cyanocobalamin 1000 MCG tablet Take 1,000 mcg by mouth daily.   cyclobenzaprine 10 MG tablet Commonly known as: FLEXERIL Take 1 tablet (10 mg total) by mouth 3 (three) times daily as needed for muscle spasms.   diclofenac Sodium 1 % Gel Commonly known as: VOLTAREN Apply 2 g topically in the morning.   diltiazem 120 MG 24 hr capsule Commonly known as: CARDIZEM CD Take 120 mg by mouth daily.   docusate  sodium 100 MG capsule Commonly known as: COLACE Take 100 mg by mouth 2 (two) times daily.   escitalopram 10 MG tablet Commonly known as: LEXAPRO Take 2 tablets (20 mg total) by mouth daily.   folic acid 1 MG tablet Commonly known as: FOLVITE Take 1 mg by mouth daily.   hydrochlorothiazide 25 MG tablet Commonly known as: HYDRODIURIL Take 25 mg by mouth daily.   Magnesium 250 MG Tabs Take 250 mg by mouth daily.   meloxicam 7.5 MG tablet Commonly known as: MOBIC Take 7.5 mg by mouth in the morning and at bedtime.   methotrexate 2.5 MG tablet Commonly known as: RHEUMATREX Take 15 mg by mouth every Wednesday.   oxyCODONE-acetaminophen 5-325 MG tablet Commonly known as: PERCOCET/ROXICET Take 1-2 tablets by mouth every 4 (four) hours as needed for severe pain. What changed:  how much to take when to take this reasons to take this   Propylene Glycol 0.6 % Soln Place 1 drop into both eyes in the morning and at bedtime.   SOOTHE XP OP Place 1 drop into both eyes daily as needed (dry eyes).   VIACTIV PO Take 1 tablet by mouth daily.   Vitamin D 50 MCG (2000 UT) Caps Take 2,000 Units by mouth daily.       ASK your doctor about these medications    LORazepam 0.5 MG tablet Commonly known as: Ativan 1 bid        Follow-up Information     Tressie Stalker, MD Follow up.   Specialty: Neurosurgery Contact information: [address] [place] [address] [phone]  Signed: Val Eagle, DNP, AGNP-C Nurse Practitioner  [facility] [address], [address], [place], [place] [address] P: [phone]    F: [phone]  07/11/2020, 12:13 PM

## 2020-07-11 NOTE — Progress Notes (Addendum)
Subjective: The patient is alert and pleasant.  Her back is appropriately sore.  She has ambulated.  Objective: Vital signs in last 24 hours: Temp:  [97.8 F (36.6 C)-98.2 F (36.8 C)] 98.2 F (36.8 C) (07/01 0346) Pulse Rate:  [74-99] 81 (07/01 0346) Resp:  [13-24] 16 (07/01 0346) BP: (110-133)/(49-61) 122/49 (07/01 0346) SpO2:  [96 %-100 %] 100 % (07/01 0346) Estimated body mass index is 18.08 kg/m as calculated from the following:   Height as of this encounter: 5' (1.524 m).   Weight as of this encounter: 42 kg.   Intake/Output from previous day: 06/30 0701 - 07/01 0700 In: 3025 [I.V.:2600; Blood:125; IV Piggyback:300] Out: 1100 [Urine:600; Blood:500] Intake/Output this shift: No intake/output data recorded.  Physical exam the patient is alert and pleasant.  Her lower extremity strength is normal.  Lab Results: Recent Labs    07/11/20 0510  WBC 10.1  HGB 8.6*  HCT 24.5*  PLT 164   BMET Recent Labs    07/11/20 0510  NA 129*  K 3.4*  CL 92*  CO2 30  GLUCOSE 162*  BUN 8  CREATININE 0.74  CALCIUM 8.2*    Studies/Results: DG THORACOLUMABAR SPINE  Result Date: 07/10/2020 CLINICAL DATA:  Posterior lumbar interbody fusion with interbody prosthesis at L1-L2; extended instrumentation to T10. EXAM: DG C-ARM 1-60 MIN; THORACOLUMBAR SPINE - 2 VIEW FLUOROSCOPY TIME:  Fluoroscopy Time:  23.4 seconds. Radiation Exposure Index (if provided by the fluoroscopic device): 2.2182 mGy. Number of Acquired Spot Images: 2 COMPARISON:  Radiographs July 07, 2020. FINDINGS: Two C-arm fluoroscopic images were obtained intraoperatively and submitted for post operative interpretation. These images demonstrate partially imaged a previous posterior lumbosacral fusion spanning from L2 inferiorly with new/interval placement of bilateral pedicle screws at T10, T11, T12, and L1 with intervening L1-L2 spacer. No unexpected findings or unexpected foreign bodies. Please see the performing provider's  procedural report for further detail. IMPRESSION: Intraoperative fluoroscopy, as detailed above. Electronically Signed   By: Feliberto Harts MD   On: 07/10/2020 12:31   DG C-Arm 1-60 Min  Result Date: 07/10/2020 CLINICAL DATA:  Posterior lumbar interbody fusion with interbody prosthesis at L1-L2; extended instrumentation to T10. EXAM: DG C-ARM 1-60 MIN; THORACOLUMBAR SPINE - 2 VIEW FLUOROSCOPY TIME:  Fluoroscopy Time:  23.4 seconds. Radiation Exposure Index (if provided by the fluoroscopic device): 2.2182 mGy. Number of Acquired Spot Images: 2 COMPARISON:  Radiographs July 07, 2020. FINDINGS: Two C-arm fluoroscopic images were obtained intraoperatively and submitted for post operative interpretation. These images demonstrate partially imaged a previous posterior lumbosacral fusion spanning from L2 inferiorly with new/interval placement of bilateral pedicle screws at T10, T11, T12, and L1 with intervening L1-L2 spacer. No unexpected findings or unexpected foreign bodies. Please see the performing provider's procedural report for further detail. IMPRESSION: Intraoperative fluoroscopy, as detailed above. Electronically Signed   By: Feliberto Harts MD   On: 07/10/2020 12:31    Assessment/Plan: Postop day #1: The patient is doing fairly well.  We will continue to mobilize her with PT and OT.  She may go home later on today or tomorrow.  I have given her discharge instructions and answered all her questions.  Acute blood loss anemia: I will start the patient on iron.  Hyponatremia, hypokalemia: I will add potassium.  He does not appear to be symptomatic.  LOS: 1 day     [name] 07/11/2020, 7:06 AM

## 2020-07-11 NOTE — Discharge Instructions (Signed)
Wound Care Keep incision covered and dry for two days.    Do not put any creams, lotions, or ointments on incision. Leave steri-strips on back.  They will fall off by themselves. Activity Walk each and every day, increasing distance each day. No lifting greater than 5 lbs.  No driving for 2 weeks; may ride as a passenger locally.  Diet Resume your normal diet.  Return to Work Will be discussed at your follow up appointment. Call Your Doctor If Any of These Occur Redness, drainage, or swelling at the wound.  Temperature greater than 101 degrees. Severe pain not relieved by pain medication. Incision starts to come apart. Follow Up Appt Call today for appointment in 1-2 weeks ([phone]) or for problems.  If you have any hardware placed in your spine, you will need an x-ray before your appointment.  

## 2020-07-11 NOTE — Evaluation (Signed)
Occupational Therapy Evaluation Patient Details Name: Carrie Austin MRN: [id number] DOB: Feb 13, 1945 Today's Date: 07/11/2020    History of Present Illness 75 yo female s/p L1-2 fusion. PMH including anxiety, arthritis, fibromyalgia, HTN, and prior back surgery (2014).   Clinical Impression   PTA, pt was living alone and was independent using a cane occasionally due to pain; plan to stay with friend at dc for increased support. Currently, pt performing ADLs and functional mobility at Mod I level. Provided education and handout on back precautions, bed mobility, grooming, LB ADLs, toileting, and shower transfer; pt demonstrated understanding. Answered all pt questions. Recommend dc home once medically stable per physician. All acute OT needs met and will sign off. Thank you.    Follow Up Recommendations  No OT follow up    Equipment Recommendations  None recommended by OT    Recommendations for Other Services PT consult     Precautions / Restrictions Precautions Precautions: Back;Fall Precaution Booklet Issued: Yes (comment) Precaution Comments: Providing handout and education on compensatory techniques Required Braces or Orthoses: Spinal Brace Spinal Brace: Lumbar corset;Applied in sitting position (Not ordered yet; notified rn) Restrictions Weight Bearing Restrictions: No      Mobility Bed Mobility Overal bed mobility: Modified Independent                  Transfers Overall transfer level: Modified independent               General transfer comment: increased time    Balance Overall balance assessment: No apparent balance deficits (not formally assessed)                                         ADL either performed or assessed with clinical judgement   ADL Overall ADL's : Modified independent                                       General ADL Comments: Providing education on compensatory techniques for back  precautions, bed mobility, brace management (verbally as brace not in room), grooming, UB ADLs, LB ADLs, toileting, and shower transfer.     Vision Baseline Vision/History: Wears glasses Patient Visual Report: No change from baseline       Perception     Praxis      Pertinent Vitals/Pain Pain Assessment: No/denies pain     Hand Dominance Right   Extremity/Trunk Assessment Upper Extremity Assessment Upper Extremity Assessment: Overall WFL for tasks assessed   Lower Extremity Assessment Lower Extremity Assessment: Defer to PT evaluation   Cervical / Trunk Assessment Cervical / Trunk Assessment: Other exceptions Cervical / Trunk Exceptions: s/p lumbar sx   Communication Communication Communication: No difficulties   Cognition Arousal/Alertness: Awake/alert Behavior During Therapy: WFL for tasks assessed/performed Overall Cognitive Status: Within Functional Limits for tasks assessed                                     General Comments       Exercises     Shoulder Instructions      Home Living Family/patient expects to be discharged to:: Private residence Living Arrangements: Alone Available Help at Discharge: Family;Available 24 hours/day Type of Home: House Home Access: Level entry  Home Layout: One level     Bathroom Shower/Tub: Occupational psychologist: Standard     Home Equipment: Cane - quad;Cane - single point;Walker - standard          Prior Functioning/Environment Level of Independence: Independent                 OT Problem List: Decreased activity tolerance;Pain;Decreased knowledge of precautions      OT Treatment/Interventions:      OT Goals(Current goals can be found in the care plan section) Acute Rehab OT Goals Patient Stated Goal: Go home OT Goal Formulation: All assessment and education complete, DC therapy  OT Frequency:     Barriers to D/C:            Co-evaluation               AM-PAC OT "6 Clicks" Daily Activity     Outcome Measure Help from another person eating meals?: None Help from another person taking care of personal grooming?: None Help from another person toileting, which includes using toliet, bedpan, or urinal?: None Help from another person bathing (including washing, rinsing, drying)?: None Help from another person to put on and taking off regular upper body clothing?: None Help from another person to put on and taking off regular lower body clothing?: None 6 Click Score: 24   End of Session Nurse Communication: Mobility status;Other (comment) (need brace)  Activity Tolerance: Patient tolerated treatment well Patient left: in chair;with call bell/phone within reach  OT Visit Diagnosis: Unsteadiness on feet (R26.81);Other abnormalities of gait and mobility (R26.89);Muscle weakness (generalized) (M62.81);Pain Pain - part of body:  (back)                Time: 4103-0131 OT Time Calculation (min): 15 min Charges:  OT General Charges $OT Visit: 1 Visit OT Evaluation $OT Eval Low Complexity: [place], OTR/L Acute Rehab Pager: [phone] Office: [place] 07/11/2020, 8:36 AM

## 2020-07-15 ENCOUNTER — Encounter (HOSPITAL_COMMUNITY): Payer: Self-pay | Admitting: *Deleted

## 2020-07-15 ENCOUNTER — Emergency Department (HOSPITAL_COMMUNITY)
Admission: EM | Admit: 2020-07-15 | Discharge: 2020-07-15 | Disposition: A | Discharge status: Home / Self Care | Payer: Medicare HMO | Attending: Emergency Medicine | Admitting: Emergency Medicine

## 2020-07-15 ENCOUNTER — Other Ambulatory Visit: Payer: Self-pay

## 2020-07-15 DIAGNOSIS — K5900 Constipation, unspecified: Secondary | ICD-10-CM | POA: Insufficient documentation

## 2020-07-15 DIAGNOSIS — Z5321 Procedure and treatment not carried out due to patient leaving prior to being seen by health care provider: Secondary | ICD-10-CM | POA: Insufficient documentation

## 2020-07-15 DIAGNOSIS — R109 Unspecified abdominal pain: Secondary | ICD-10-CM | POA: Insufficient documentation

## 2020-07-15 DIAGNOSIS — M545 Low back pain, unspecified: Secondary | ICD-10-CM | POA: Diagnosis not present

## 2020-07-15 NOTE — ED Provider Notes (Signed)
Emergency Medicine Provider Triage Evaluation Note  Carrie Austin , a 75 y.o. female  was evaluated in triage.  Pt complains of postop problem.  A posterior lumbar interbody fusion on July 10, 2020 by Dr. Lovell Sheehan of neurosurgery.  She has not had a bowel movement in 6 days, also endorses urinary incontinence which is new.  Along with swelling to bilateral feet.  Currently on hydrocodone for pain control, Flexeril.  She is passing "very little gas ".  Also on a stool softener.  Review of Systems  Positive: Abdominal pain, back pain, constipation Negative: Fever, nausea, vomiting,   Physical Exam  BP 140/60 (BP Location: Right Arm)   Pulse 70   Temp 98.7 F (37.1 C) (Oral)   Resp 15   SpO2 97%  Gen:   Awake, no distress   Resp:  Normal effort  MSK:   Moves extremities without difficulty  Other:  Lumbar brace in place.  Medical Decision Making  Medically screening exam initiated at 2:24 PM.  Appropriate orders placed.  Carrie Austin was informed that the remainder of the evaluation will be completed by another provider, this initial triage assessment does not replace that evaluation, and the importance of remaining in the ED until their evaluation is complete.     Carrie Manges, PA-C 07/15/20 1428    Carrie Loll, MD 07/16/20 1525

## 2020-07-15 NOTE — ED Provider Notes (Deleted)
Emergency Medicine Provider Triage Evaluation Note  Carrie Austin , a 75 y.o. female  was evaluated in triage.  Pt complains of substernal chest pain this morning while watering her tomatoes with radiation to her back.  In addition, right arm tingling with radiation to her neck.  Prior history of angina diagnosed by Dr. Salena Saner of cardiology.  Did take nitro x2 with resolution in symptoms.  No history of CAD, family history of CAD remarkable for mother with similar etiology.  Non-smoker.  Review of Systems  Positive: Chest pain Negative: Shortness of breath  Physical Exam  BP 140/60 (BP Location: Right Arm)   Pulse 70   Temp 98.7 F (37.1 C) (Oral)   Resp 15   SpO2 97%  Gen:   Awake, no distress   Resp:  Normal effort  MSK:   Moves extremities without difficulty  Other:    Medical Decision Making  Medically screening exam initiated at 2:16 PM.  Appropriate orders placed.  Iman Reinertsen Maines was informed that the remainder of the evaluation will be completed by another provider, this initial triage assessment does not replace that evaluation, and the importance of remaining in the ED until their evaluation is complete.     Claude Manges, PA-C 07/15/20 1419

## 2020-07-15 NOTE — ED Notes (Signed)
Patient's family member stated they were leaving, that he could "make her more comfortable at home".

## 2020-07-15 NOTE — ED Triage Notes (Signed)
Pt reports having back surgery on THursday, went home on Friday. Having constipation since, no relief with otc laxative. Pt reports bilateral lower back pain that radiates around to her abd. Denies n/v or urinary symptoms.

## 2020-10-08 ENCOUNTER — Encounter: Payer: Self-pay | Admitting: Physical Therapy

## 2020-10-08 ENCOUNTER — Other Ambulatory Visit: Payer: Self-pay

## 2020-10-08 ENCOUNTER — Ambulatory Visit: Payer: Medicare HMO | Admitting: Physical Therapy

## 2020-10-08 DIAGNOSIS — R293 Abnormal posture: Secondary | ICD-10-CM | POA: Diagnosis not present

## 2020-10-08 DIAGNOSIS — G8929 Other chronic pain: Secondary | ICD-10-CM | POA: Diagnosis not present

## 2020-10-08 DIAGNOSIS — M545 Low back pain, unspecified: Secondary | ICD-10-CM | POA: Diagnosis not present

## 2020-10-08 DIAGNOSIS — M6281 Muscle weakness (generalized): Secondary | ICD-10-CM | POA: Diagnosis not present

## 2020-10-08 NOTE — Patient Instructions (Signed)
Access Code: [email or web address] URL: [email or web address] Date: 10/08/2020 Prepared by: [facility]  Exercises Supine Transversus Abdominis Bracing - Hands on Stomach - 1 x daily - 7 x weekly - 1 sets - 10 reps - 5 seconds hold Supine Bridge - 1 x daily - 7 x weekly - 2 sets - 10 reps Thomas Stretch on Table - 1 x daily - 7 x weekly - 3 sets - 1 reps - 20-30 sec hold Standing Quadratus Lumborum Stretch with Doorway - 1 x daily - 7 x weekly - 3 sets - 1 reps - 20-30 sec hold

## 2020-10-08 NOTE — Therapy (Signed)
[facility] [place] [address] [place], [place], [address] Phone: [phone]   Fax:  [phone]  Physical Therapy Evaluation  Patient Details  Name: Carrie Austin MRN: [id number] Date of Birth: 24-Aug-1945 Referring Provider (PT): Peggye Ley   Encounter Date: 10/08/2020   PT End of Session - 10/08/20 1226     Visit Number 1    Number of Visits 12    Date for PT Re-Evaluation 11/19/20    Authorization Type [employer] medicare    Authorization - Visit Number 1    Authorization - Number of Visits 12    Progress Note Due on Visit 10    PT Start Time 1145    PT Stop Time 1225    PT Time Calculation (min) 40 min    Activity Tolerance Patient tolerated treatment well    Behavior During Therapy WFL for tasks assessed/performed             Past Medical History:  Diagnosis Date   Allergy    Anxiety    Arthritis    Cough    due allergies chronic   Depression    Dilatation of esophagus    Fibromyalgia    GERD (gastroesophageal reflux disease)    Headache(784.0)    migraines   Heart murmur    one time   Hypertension    PONV (postoperative nausea and vomiting)    hx of Vertigo after anesthesia    Past Surgical History:  Procedure Laterality Date   ABDOMINAL HYSTERECTOMY     BACK SURGERY  12/21/2012   CHOLECYSTECTOMY     COLONOSCOPY     DIAGNOSTIC LAPAROSCOPY     lap chole   EYE SURGERY Bilateral    lasik   TUBAL LIGATION      There were no vitals filed for this visit.    Subjective Assessment - 10/08/20 1147     Subjective Pt had L1-2 fusion 07/10/20. Since surgery she has numbness around incision, pain on bilat sides which radiates to the abdomen, pain in midline of back around surgical site. Pt is wearing back brace when up and active which helps decrease pain. Pain increases with increased standing and physical activity, eases with meds.    Pertinent History laminectomy L5/S1, fusion L2-3     Limitations Standing    How long can you stand comfortably? 30 minutes    Patient Stated Goals decrease pain    Currently in Pain? Yes    Pain Score 4     Pain Location Back    Pain Orientation Mid;Lower;Posterior    Pain Descriptors / Indicators Sharp    Pain Type Chronic pain    Pain Onset More than a month ago    Pain Frequency Intermittent    Aggravating Factors  standing    Pain Relieving Factors meds                [facility] PT Assessment - 10/08/20 0001       Assessment   Medical Diagnosis low back pain    Referring Provider (PT) jeffery jenkins    Onset Date/Surgical Date 07/10/20    Next MD Visit as needed    Prior Therapy for cervical surgery      Precautions   Precautions None      Balance Screen   Has the patient fallen in the past 6 months Yes    How many times? 1    Has the patient had a decrease  in activity level because of a fear of falling?  No    Is the patient reluctant to leave their home because of a fear of falling?  No      Prior Function   Level of Independence Independent      Observation/Other Assessments   Focus on Therapeutic Outcomes (FOTO)  61      Posture/Postural Control   Posture Comments forward flexed trunk      ROM / Strength   AROM / PROM / Strength AROM;Strength      AROM   AROM Assessment Site Lumbar    Lumbar Flexion limited 25%    Lumbar Extension to neutral    Lumbar - Right Side Bend limited 75% - pain    Lumbar - Left Side Bend limited 75% - pain    Lumbar - Right Rotation limited 75% - pain    Lumbar - Left Rotation limited 75% - pain      Strength   Strength Assessment Site Hip    Right/Left Hip Right;Left    Right Hip Flexion 4/5    Right Hip Extension 3-/5    Right Hip ABduction 4+/5    Left Hip Flexion 4/5    Left Hip Extension 3-/5    Left Hip ABduction 4+/5      Palpation   Spinal mobility hypomobile due to multiple fusions    Palpation comment TTP Lt rib 11, 12 and T 10-L2 on Lt.      Special  Tests   Other special tests SLR and FABER negative bilat                        Objective measurements completed on examination: See above findings.       [facility] Adult PT Treatment/Exercise - 10/08/20 0001       Exercises   Exercises Lumbar      Lumbar Exercises: Stretches   Hip Flexor Stretch Right;Left;2 reps;20 seconds    Hip Flexor Stretch Limitations thomas stretch    Other Lumbar Stretch Exercise QL stretch in doorway x 20 each side      Lumbar Exercises: Supine   Ab Set 5 reps;5 seconds    Bridge 10 reps                     PT Education - 10/08/20 1217     Education Details PT POC and goals, HEP    Person(s) Educated Patient    Methods Explanation;Demonstration;Handout    Comprehension Returned demonstration;Verbalized understanding                 PT Long Term Goals - 10/08/20 1230       PT LONG TERM GOAL #1   Title Pt will be independent with HEP    Time 6    Period Weeks    Status New    Target Date 11/19/20      PT LONG TERM GOAL #2   Title Pt will improve FOTO to >= 62 to demo improved functional mobility    Time 6    Period Weeks    Status New    Target Date 11/19/20      PT LONG TERM GOAL #3   Title Pt will improve bilat LE strength to 4+/5 to tolerate standing with decreased pain    Time 6    Period Weeks    Status New    Target Date 11/19/20  PT LONG TERM GOAL #4   Title Pt will tolerate standing x 30 minutes with pain <= 2/10    Time 6    Period Weeks    Status New    Target Date 11/19/20                    Plan - 10/08/20 1227     Clinical Impression Statement Pt is a 75 y/o female referred for low back pain. Pt presents with history of multiple lumbar surgeries with increased pain, decreased mobility and ROM, impaired strength, decreased funcitonal mobility. Pt will benefit from skilled PT to address deficits and improve funcitonal mobility    Personal Factors and Comorbidities  Age;Comorbidity 3+;Past/Current Experience;Time since onset of injury/illness/exacerbation    Examination-Activity Limitations Stand;Carry    Examination-Participation Restrictions Community Activity;Yard Work;Cleaning    Stability/Clinical Decision Making Stable/Uncomplicated    Clinical Decision Making Low    Rehab Potential Good    PT Frequency 2x / week    PT Duration 6 weeks    PT Treatment/Interventions Dry needling;Manual techniques;Taping;Neuromuscular re-education;Balance training;Patient/family education;Therapeutic exercise;Therapeutic activities;Moist Heat;Electrical Stimulation;Aquatic Therapy;Cryotherapy    PT Next Visit Plan assess HEP, progress core strength    PT Home Exercise Plan [email or web address]    Consulted and Agree with Plan of Care Patient             Patient will benefit from skilled therapeutic intervention in order to improve the following deficits and impairments:  Postural dysfunction, Pain, Decreased strength, Decreased activity tolerance, Decreased range of motion, Hypomobility  Visit Diagnosis: Chronic bilateral low back pain without sciatica - Plan: PT plan of care cert/re-cert  Muscle weakness (generalized) - Plan: PT plan of care cert/re-cert  Abnormal posture - Plan: PT plan of care cert/re-cert     Problem List Patient Active Problem List   Diagnosis Date Noted   Spinal stenosis of lumbar region with neurogenic claudication 07/10/2020   Migraine without aura and without status migrainosus, not intractable 07/09/2019   Loss of consciousness (HCC) 07/09/2019   GERD (gastroesophageal reflux disease) 10/11/2016   Rheumatoid arthritis involving both hands (HCC) 09/27/2016   Lumbar stenosis with neurogenic claudication 05/17/2016   Impingement syndrome of right shoulder 12/01/2015   Acute superficial gastritis without hemorrhage 07/01/2015   Varicose veins of both lower extremities with pain 05/22/2015   Age-related osteoporosis without current  pathological fracture 05/12/2015   Chronic pain syndrome 05/12/2015   Essential hypertension 05/12/2015   Urge incontinence 05/12/2015   Recurrent major depressive disorder, in remission (HCC) 05/12/2015   Mixed hyperlipidemia 05/12/2015   Severe episode of recurrent major depressive disorder, without psychotic features (HCC) 05/12/2015   Obstipation 04/14/2015   Irritable bowel syndrome with constipation 02/26/2015   Other allergic rhinitis 12/26/2013   Spondylolisthesis 12/21/2012   Left lumbar radiculopathy 07/05/2012   Left leg pain 07/05/2012   Left cervical radiculopathy 07/05/2012   Dysphagia 09/02/2011   GERD with stricture 06/22/2011   Stricture of esophagus 06/22/2011    Zunaira Lamy, PT 10/08/2020, 12:36 PM  [facility] [place] [address] [place], [place], [address] Phone: [phone]   Fax:  [phone]  Name: Carrie Austin MRN: [id number] Date of Birth: 1945-10-15

## 2020-10-15 ENCOUNTER — Encounter: Payer: Self-pay | Admitting: Physical Therapy

## 2020-10-15 ENCOUNTER — Ambulatory Visit: Payer: Medicare HMO | Admitting: Physical Therapy

## 2020-10-15 ENCOUNTER — Other Ambulatory Visit: Payer: Self-pay

## 2020-10-15 DIAGNOSIS — R293 Abnormal posture: Secondary | ICD-10-CM

## 2020-10-15 DIAGNOSIS — M545 Low back pain, unspecified: Secondary | ICD-10-CM | POA: Diagnosis not present

## 2020-10-15 DIAGNOSIS — M6281 Muscle weakness (generalized): Secondary | ICD-10-CM | POA: Diagnosis not present

## 2020-10-15 DIAGNOSIS — G8929 Other chronic pain: Secondary | ICD-10-CM | POA: Diagnosis not present

## 2020-10-15 NOTE — Therapy (Signed)
[facility] [place] [address] [place], [place], [address] Phone: [phone]   Fax:  [phone]  Physical Therapy Treatment  Patient Details  Name: Carrie Austin MRN: [id number] Date of Birth: Oct 08, 1945 Referring Provider (PT): Peggye Ley   Encounter Date: 10/15/2020   PT End of Session - 10/15/20 1353     Visit Number 2    Number of Visits 12    Date for PT Re-Evaluation 11/19/20    Authorization Type [employer] medicare    Authorization - Visit Number 2    Authorization - Number of Visits 12    Progress Note Due on Visit 10    PT Start Time 1348    PT Stop Time 1437    PT Time Calculation (min) 49 min    Activity Tolerance Patient tolerated treatment well    Behavior During Therapy WFL for tasks assessed/performed             Past Medical History:  Diagnosis Date   Allergy    Anxiety    Arthritis    Cough    due allergies chronic   Depression    Dilatation of esophagus    Fibromyalgia    GERD (gastroesophageal reflux disease)    Headache(784.0)    migraines   Heart murmur    one time   Hypertension    PONV (postoperative nausea and vomiting)    hx of Vertigo after anesthesia    Past Surgical History:  Procedure Laterality Date   ABDOMINAL HYSTERECTOMY     BACK SURGERY  12/21/2012   CHOLECYSTECTOMY     COLONOSCOPY     DIAGNOSTIC LAPAROSCOPY     lap chole   EYE SURGERY Bilateral    lasik   TUBAL LIGATION      There were no vitals filed for this visit.   Subjective Assessment - 10/15/20 1354     Subjective Pt reports that the side stretch in door way has made her back hurt worse.  The hip flexor stretch is also painful.  She has been walking about 20 min at a time, 3x / wk.  She continues to wear back brace 3 hrs per day when up and about or driving.    Pertinent History laminectomy L5/S1, fusion L2-3    How long can you stand comfortably? 30 minutes    Patient Stated Goals decrease  pain    Currently in Pain? Yes    Pain Score 5     Pain Location Back    Aggravating Factors  standing still    Pain Relieving Factors medication                [facility] PT Assessment - 10/15/20 0001       Assessment   Medical Diagnosis low back pain    Referring Provider (PT) Peggye Ley    Onset Date/Surgical Date 07/10/20    Next MD Visit as needed    Prior Therapy for cervical surgery               [facility] Adult PT Treatment/Exercise - 10/15/20 0001       Lumbar Exercises: Stretches   Lower Trunk Rotation 30 seconds    Lower Trunk Rotation Limitations gentle rocking in limited motion with knees together, then with feet wide for more hip stretch    Hip Flexor Stretch Right;Left;20 seconds;3 reps   seated with leg back/ last rep with single arm reach upward.   Piriformis Stretch  Right;Left;2 reps;20 seconds      Lumbar Exercises: Aerobic   Tread Mill 0.7-0.9 mph; unable to get footing - switched to 5 laps around gym for warm up.      Lumbar Exercises: Seated   Sit to Stand 10 reps    Other Seated Lumbar Exercises ab set x 5 sec x 10 reps, cues to not hold breath and to relax shoulders      Lumbar Exercises: Supine   Bridge 10 reps;5 seconds    Straight Leg Raise 10 reps      Lumbar Exercises: Sidelying   Clam Right;Left;10 reps      Modalities   Modalities Moist Heat;Electrical Stimulation      Moist Heat Therapy   Number Minutes Moist Heat 10 Minutes    Moist Heat Location Lumbar Spine      Electrical Stimulation   Electrical Stimulation Location lower thoracic/upper lumbar    Electrical Stimulation Action TENS    Electrical Stimulation Parameters 10 min, intensity to tolerance    Electrical Stimulation Goals Pain                     PT Education - 10/15/20 1636     Education Details HEP updated, TENS info    Person(s) Educated Patient    Methods Explanation;Demonstration;Verbal cues;Handout    Comprehension Verbalized  understanding;Returned demonstration                 PT Long Term Goals - 10/08/20 1230       PT LONG TERM GOAL #1   Title Pt will be independent with HEP    Time 6    Period Weeks    Status New    Target Date 11/19/20      PT LONG TERM GOAL #2   Title Pt will improve FOTO to >= 62 to demo improved functional mobility    Time 6    Period Weeks    Status New    Target Date 11/19/20      PT LONG TERM GOAL #3   Title Pt will improve bilat LE strength to 4+/5 to tolerate standing with decreased pain    Time 6    Period Weeks    Status New    Target Date 11/19/20      PT LONG TERM GOAL #4   Title Pt will tolerate standing x 30 minutes with pain <= 2/10    Time 6    Period Weeks    Status New    Target Date 11/19/20                   Plan - 10/15/20 1403     Clinical Impression Statement Pt tolerated walking around gym better than walking on treadmill.  Modified HEP for improved tolerance.  Pt reported slight decrease in pain after completion of exercises and further reduction with use of TENS / MHP at end of session.  Goals are ongoing.    Personal Factors and Comorbidities Age;Comorbidity 3+;Past/Current Experience;Time since onset of injury/illness/exacerbation    Examination-Activity Limitations Stand;Carry    Examination-Participation Restrictions Community Activity;Yard Work;Cleaning    Stability/Clinical Decision Making Stable/Uncomplicated    Rehab Potential Good    PT Frequency 2x / week    PT Duration 6 weeks    PT Treatment/Interventions Dry needling;Manual techniques;Taping;Neuromuscular re-education;Balance training;Patient/family education;Therapeutic exercise;Therapeutic activities;Moist Heat;Electrical Stimulation;Aquatic Therapy;Cryotherapy    PT Next Visit Plan assess response to updated HEP, progress core strength; modalities  as indicated.    PT Home Exercise Plan [email or web address]    Consulted and Agree with Plan of Care Patient              Patient will benefit from skilled therapeutic intervention in order to improve the following deficits and impairments:  Postural dysfunction, Pain, Decreased strength, Decreased activity tolerance, Decreased range of motion, Hypomobility  Visit Diagnosis: Chronic bilateral low back pain without sciatica  Muscle weakness (generalized)  Abnormal posture     Problem List Patient Active Problem List   Diagnosis Date Noted   Spinal stenosis of lumbar region with neurogenic claudication 07/10/2020   Migraine without aura and without status migrainosus, not intractable 07/09/2019   Loss of consciousness (HCC) 07/09/2019   GERD (gastroesophageal reflux disease) 10/11/2016   Rheumatoid arthritis involving both hands (HCC) 09/27/2016   Lumbar stenosis with neurogenic claudication 05/17/2016   Impingement syndrome of right shoulder 12/01/2015   Acute superficial gastritis without hemorrhage 07/01/2015   Varicose veins of both lower extremities with pain 05/22/2015   Age-related osteoporosis without current pathological fracture 05/12/2015   Chronic pain syndrome 05/12/2015   Essential hypertension 05/12/2015   Urge incontinence 05/12/2015   Recurrent major depressive disorder, in remission (HCC) 05/12/2015   Mixed hyperlipidemia 05/12/2015   Severe episode of recurrent major depressive disorder, without psychotic features (HCC) 05/12/2015   Obstipation 04/14/2015   Irritable bowel syndrome with constipation 02/26/2015   Other allergic rhinitis 12/26/2013   Spondylolisthesis 12/21/2012   Left lumbar radiculopathy 07/05/2012   Left leg pain 07/05/2012   Left cervical radiculopathy 07/05/2012   Dysphagia 09/02/2011   GERD with stricture 06/22/2011   Stricture of esophagus 06/22/2011   Mayer Camel, PTA 10/15/20 4:36 PM   [facility] [place] [address] [place], [place], [address] Phone: [phone]   Fax:   [phone]  Name: Carrie Austin MRN: [id number] Date of Birth: [birth year]

## 2020-10-15 NOTE — Patient Instructions (Addendum)
TENS UNIT: This is helpful for muscle pain and spasm.   Search and Purchase a TENS 7000 2nd edition at [email or web address]. It should be less than $30.     TENS unit instructions: Do not shower or bathe with the unit on Turn the unit off before removing electrodes or batteries If the electrodes lose stickiness add a drop of water to the electrodes after they are disconnected from the unit and place on plastic sheet. If you continued to have difficulty, call the TENS unit company to purchase more electrodes. Do not apply lotion on the skin area prior to use. Make sure the skin is clean and dry as this will help prolong the life of the electrodes. After use, always check skin for unusual red areas, rash or other skin difficulties. If there are any skin problems, does not apply electrodes to the same area. Never remove the electrodes from the unit by pulling the wires. Do not use the TENS unit or electrodes other than as directed. Do not change electrode placement without consultating your therapist or physician. Keep 2 fingers with between each electrode. Wear time ratio is 2:1, on to off times.    For example on for 30 minutes off for 15 minutes and then on for 30 minutes off for 15 minutes Access Code: [email or web address] URL: [email or web address] Date: 10/15/2020 Prepared by: [facility] [place]  Exercises Supine Active Straight Leg Raise - 1 x daily - 7 x weekly - 1 sets - 10 reps Supine Bridge - 1 x daily - 7 x weekly - 1-2 sets - 10 reps - 3-5 seconds hold Clamshell - 1 x daily - 7 x weekly - 1 sets - 10 reps Supine Piriformis Stretch with Foot on Ground - 1 x daily - 7 x weekly - 1 sets - 2 reps - 15 seconds hold Sit to Stand - 1 x daily - 7 x weekly - 1 sets - 5-10 reps Seated Transversus Abdominis Bracing - 1-2 x daily - 7 x weekly - 1 sets - 5 reps - 5-10 sec hold Seated Hip Flexor Stretch - 2 x daily - 7 x weekly - 1 sets - 2-3 reps - 15-30 seconds hold

## 2020-10-17 ENCOUNTER — Other Ambulatory Visit: Payer: Self-pay

## 2020-10-17 ENCOUNTER — Ambulatory Visit: Payer: Medicare HMO | Admitting: Physical Therapy

## 2020-10-17 DIAGNOSIS — R293 Abnormal posture: Secondary | ICD-10-CM

## 2020-10-17 DIAGNOSIS — G8929 Other chronic pain: Secondary | ICD-10-CM

## 2020-10-17 DIAGNOSIS — M6281 Muscle weakness (generalized): Secondary | ICD-10-CM

## 2020-10-17 DIAGNOSIS — M545 Low back pain, unspecified: Secondary | ICD-10-CM | POA: Diagnosis not present

## 2020-10-17 NOTE — Therapy (Signed)
[facility] [place] [address] [place], [place], [address] Phone: [phone]   Fax:  [phone]  Physical Therapy Treatment  Patient Details  Name: Carrie Austin MRN: [id number] Date of Birth: 05/02/1945 Referring Provider (PT): Peggye Ley   Encounter Date: 10/17/2020   PT End of Session - 10/17/20 1353     Visit Number 3    Number of Visits 12    Date for PT Re-Evaluation 11/19/20    Authorization Type [employer] medicare    Authorization - Visit Number 3    Authorization - Number of Visits 12    Progress Note Due on Visit 10    PT Start Time 1322    PT Stop Time 1400    PT Time Calculation (min) 38 min    Activity Tolerance Patient tolerated treatment well    Behavior During Therapy WFL for tasks assessed/performed             Past Medical History:  Diagnosis Date   Allergy    Anxiety    Arthritis    Cough    due allergies chronic   Depression    Dilatation of esophagus    Fibromyalgia    GERD (gastroesophageal reflux disease)    Headache(784.0)    migraines   Heart murmur    one time   Hypertension    PONV (postoperative nausea and vomiting)    hx of Vertigo after anesthesia    Past Surgical History:  Procedure Laterality Date   ABDOMINAL HYSTERECTOMY     BACK SURGERY  12/21/2012   CHOLECYSTECTOMY     COLONOSCOPY     DIAGNOSTIC LAPAROSCOPY     lap chole   EYE SURGERY Bilateral    lasik   TUBAL LIGATION      There were no vitals filed for this visit.   Subjective Assessment - 10/17/20 1328     Subjective Pt states "I am tired of hurting". Pt states that nothing helps except medicine.    Currently in Pain? Yes    Pain Score 6     Pain Location Back    Pain Orientation Lower;Mid                [facility] PT Assessment - 10/17/20 0001       Assessment   Medical Diagnosis low back pain    Referring Provider (PT) Peggye Ley    Onset Date/Surgical Date 07/10/20    Next MD  Visit as needed    Prior Therapy for cervical surgery      Strength   Right Hip Extension 3/5    Left Hip Extension 3/5                           [facility] Adult PT Treatment/Exercise - 10/17/20 0001       Lumbar Exercises: Stretches   Hip Flexor Stretch Right;Left;20 seconds;3 reps   seated with leg back   Piriformis Stretch Right;Left;2 reps;20 seconds      Lumbar Exercises: Aerobic   Other Aerobic Exercise laps around gym x 3 minutes for warm up      Lumbar Exercises: Standing   Row 20 reps    Theraband Level (Row) Level 2 (Red)    Row Limitations cues for posture    Shoulder Extension 20 reps    Theraband Level (Shoulder Extension) Level 2 (Red)    Shoulder Extension Limitations cues for posture  Lumbar Exercises: Supine   Bridge 10 reps;5 seconds    Straight Leg Raise 10 reps    Other Supine Lumbar Exercises LTR x 30 seconds      Lumbar Exercises: Sidelying   Clam Right;Left;20 reps      Moist Heat Therapy   Number Minutes Moist Heat 10 Minutes    Moist Heat Location Lumbar Spine      Electrical Stimulation   Electrical Stimulation Location thoracic/lumbar    Electrical Stimulation Action TENS    Electrical Stimulation Parameters to tolerance    Electrical Stimulation Goals Pain      Manual Therapy   Manual Therapy Soft tissue mobilization    Soft tissue mobilization STM lumbar and thoracic parapsinals, lats, intercostals to tolerance to reduce spasticity                          PT Long Term Goals - 10/08/20 1230       PT LONG TERM GOAL #1   Title Pt will be independent with HEP    Time 6    Period Weeks    Status New    Target Date 11/19/20      PT LONG TERM GOAL #2   Title Pt will improve FOTO to >= 62 to demo improved functional mobility    Time 6    Period Weeks    Status New    Target Date 11/19/20      PT LONG TERM GOAL #3   Title Pt will improve bilat LE strength to 4+/5 to tolerate standing with  decreased pain    Time 6    Period Weeks    Status New    Target Date 11/19/20      PT LONG TERM GOAL #4   Title Pt will tolerate standing x 30 minutes with pain <= 2/10    Time 6    Period Weeks    Status New    Target Date 11/19/20                   Plan - 10/17/20 1354     Clinical Impression Statement Pt continues wiht increased pain. No change in pain after exercises, good reduction of pain with use of TENS/heat. goals ongoing    PT Next Visit Plan assess and update HEP, core strength, manual/modalities as indicated    PT Home Exercise Plan [id number]    Consulted and Agree with Plan of Care Patient             Patient will benefit from skilled therapeutic intervention in order to improve the following deficits and impairments:     Visit Diagnosis: Chronic bilateral low back pain without sciatica  Muscle weakness (generalized)  Abnormal posture     Problem List Patient Active Problem List   Diagnosis Date Noted   Spinal stenosis of lumbar region with neurogenic claudication 07/10/2020   Migraine without aura and without status migrainosus, not intractable 07/09/2019   Loss of consciousness (HCC) 07/09/2019   GERD (gastroesophageal reflux disease) 10/11/2016   Rheumatoid arthritis involving both hands (HCC) 09/27/2016   Lumbar stenosis with neurogenic claudication 05/17/2016   Impingement syndrome of right shoulder 12/01/2015   Acute superficial gastritis without hemorrhage 07/01/2015   Varicose veins of both lower extremities with pain 05/22/2015   Age-related osteoporosis without current pathological fracture 05/12/2015   Chronic pain syndrome 05/12/2015   Essential hypertension 05/12/2015   Urge incontinence 05/12/2015  Recurrent major depressive disorder, in remission (HCC) 05/12/2015   Mixed hyperlipidemia 05/12/2015   Severe episode of recurrent major depressive disorder, without psychotic features (HCC) 05/12/2015   Obstipation 04/14/2015    Irritable bowel syndrome with constipation 02/26/2015   Other allergic rhinitis 12/26/2013   Spondylolisthesis 12/21/2012   Left lumbar radiculopathy 07/05/2012   Left leg pain 07/05/2012   Left cervical radiculopathy 07/05/2012   Dysphagia 09/02/2011   GERD with stricture 06/22/2011   Stricture of esophagus 06/22/2011    Dontavion Noxon, PT 10/17/2020, 1:56 PM  [facility] [address] [place], [place], [address] Phone: [phone]   Fax:  [phone]  Name: ALEXIANA LAVERDURE MRN: [id number] Date of Birth: 1945/08/02

## 2020-10-22 ENCOUNTER — Telehealth (HOSPITAL_BASED_OUTPATIENT_CLINIC_OR_DEPARTMENT_OTHER): Payer: Medicare HMO | Admitting: Psychiatry

## 2020-10-22 ENCOUNTER — Other Ambulatory Visit: Payer: Self-pay

## 2020-10-22 ENCOUNTER — Encounter: Payer: Medicare HMO | Admitting: Physical Therapy

## 2020-10-22 DIAGNOSIS — F325 Major depressive disorder, single episode, in full remission: Secondary | ICD-10-CM

## 2020-10-22 DIAGNOSIS — F332 Major depressive disorder, recurrent severe without psychotic features: Secondary | ICD-10-CM | POA: Diagnosis not present

## 2020-10-22 MED ORDER — LORAZEPAM 0.5 MG PO TABS: ORAL_TABLET | ORAL | 4 refills | Status: DC

## 2020-10-22 MED ORDER — ESCITALOPRAM OXALATE 10 MG PO TABS
20.0000 mg | ORAL_TABLET | Freq: Every day | ORAL | 5 refills | Status: DC
Start: 2020-10-22 — End: 2021-02-25

## 2020-10-22 NOTE — Progress Notes (Signed)
BH MD/PA/NP OP Progress Note  06/13/2020 9:15 AM Carrie Austin  MRN:  [id number]  Chief Complaint: Major depression in remission  HPI:   Today the patient is actually doing pretty well.  This is despite the fact that she continues to have some chronic back pain.  She did end up having surgery on her back which is at some residual bilateral rib pain.  She is still seeing her surgeon about this.  She is very comfortable with it.  She also had some skin surgery and that seems to be doing okay.  Otherwise she denies chest pain shortness of breath or any neurological symptoms.  Her anxiety is actually well controlled taking a low-dose of Ativan 0.5 mg twice daily.  She had no falls.  She takes Lexapro and we reduced it down to 10 mg which is working well.  Patient drinks no alcohol and uses no drugs.  She is functioning very well.  She is very independent.  She got back with her boyfriend.  The patient still drives she still stays very active.   ICD-10-CM   1. Severe episode of recurrent major depressive disorder, without psychotic features (HCC)  F33.2 escitalopram (LEXAPRO) 10 MG tablet    Past Psychiatric History: See intake H&P for full details. Reviewed, with no updates at this time.   Past Medical History:  Past Medical History:  Diagnosis Date   Allergy    Anxiety    Arthritis    Cough    due allergies chronic   Depression    Dilatation of esophagus    Fibromyalgia    GERD (gastroesophageal reflux disease)    Headache(784.0)    Heart murmur    one time   Hypertension    PONV (postoperative nausea and vomiting)    hx of Vertigo after anesthesia    Past Surgical History:  Procedure Laterality Date   ABDOMINAL HYSTERECTOMY     BACK SURGERY  12/21/12   CHOLECYSTECTOMY     COLONOSCOPY     EYE SURGERY Bilateral    lasik    Family Psychiatric History: See intake H&P for full details. Reviewed, with no updates at this time.   Family History: No family history on  file.  Social History:  Social History   Socioeconomic History   Marital status: Widowed    Spouse name: Not on file   Number of children: Not on file   Years of education: Not on file   Highest education level: Not on file  Occupational History   Not on file  Tobacco Use   Smoking status: Never Smoker   Smokeless tobacco: Never Used  Vaping Use   Vaping Use: Never used  Substance and Sexual Activity   Alcohol use: No    Comment: social   Drug use: No   Sexual activity: Never  Other Topics Concern   Not on file  Social History Narrative   Not on file   Social Determinants of Health   Financial Resource Strain: Not on file  Food Insecurity: Not on file  Transportation Needs: Not on file  Physical Activity: Not on file  Stress: Not on file  Social Connections: Not on file    Allergies:  Allergies  Allergen Reactions   Lisinopril Cough    Deep cough   Amoxicillin Rash    Has patient had a PCN reaction causing immediate rash, facial/tongue/throat swelling, SOB or lightheadedness with hypotension:unsure Has patient had a PCN reaction causing severe rash involving  mucus membranes or skin necrosis:unsure Has patient had a PCN reaction that required hospitalization:NO  . Has patient had a PCN reaction occurring within the last 10 years:    Yes If all of the above answers are "NO", then may proceed with Cephalosporin use.    Cephalosporins Other (See Comments)    Flushing of the face    Metabolic Disorder Labs: No results found for: HGBA1C, MPG No results found for: PROLACTIN No results found for: CHOL, TRIG, HDL, CHOLHDL, VLDL, LDLCALC No results found for: TSH  Therapeutic Level Labs: No results found for: LITHIUM No results found for: VALPROATE No components found for:  CBMZ  Current Medications: Current Outpatient Medications  Medication Sig Dispense Refill   Artificial Tear Ointment (SOOTHE NIGHT TIME) OINT Place 1 application into both eyes at bedtime.      atorvastatin (LIPITOR) 20 MG tablet Take 20 mg by mouth every Wednesday.      cetirizine (ZYRTEC) 10 MG tablet Take by mouth.     Cholecalciferol (VITAMIN D) 2000 UNITS CAPS Take 2,000 Units by mouth daily.     clobetasol ointment (TEMOVATE) 0.05 % Apply 1 application topically 2 (two) times daily as needed (for rash).     COD LIVER OIL PO Take 5 mLs by mouth daily.     docusate sodium (RA COL-RITE) 100 MG capsule Take 100 mg by mouth 2 (two) times daily.     escitalopram (LEXAPRO) 10 MG tablet Take 2 tablets (20 mg total) by mouth daily. 60 tablet 5   folic acid (FOLVITE) 1 MG tablet Take 1 mg by mouth daily.     gabapentin (NEURONTIN) 100 MG capsule Take by mouth.     hydrocortisone 2.5 % cream Apply 1 application topically 2 (two) times daily as needed (for skin irritation/rash).      LORazepam (ATIVAN) 0.5 MG tablet 1 bid 60 tablet 4   losartan (COZAAR) 100 MG tablet Take 100 mg by mouth daily.     Magnesium 250 MG TABS Take 250 mg by mouth daily.     meloxicam (MOBIC) 7.5 MG tablet TAKE 1 TABLET(7.5 MG) BY MOUTH TWICE DAILY FOR UP TO 60 DOSES AS NEEDED     methotrexate (RHEUMATREX) 2.5 MG tablet Take by mouth.     oxyCODONE-acetaminophen (PERCOCET/ROXICET) 5-325 MG tablet Take by mouth every 4 (four) hours as needed for severe pain.     Propylene Glycol (SYSTANE BALANCE) 0.6 % SOLN Place 1 drop into both eyes 4 (four) times daily as needed (for dry/irritated eyes (scheduled EVERY MORNING)).     silver sulfADIAZINE (SILVADENE) 1 % cream Apply to affected area twice daily during dressing change.     No current facility-administered medications for this visit.     Musculoskeletal: Strength & Muscle Tone: within normal limits Gait & Station: normal Patient leans: N/A  Psychiatric Specialty Exam: ROS  There were no vitals taken for this visit.There is no height or weight on file to calculate BMI.  General Appearance: Casual and Fairly Groomed  Eye Contact:  Fair  Speech:  Clear  and Coherent and Normal Rate  Volume:  Normal  Mood:  Anxious and Dysphoric  Affect:  Appropriate and Congruent  Thought Process:  Goal Directed and Descriptions of Associations: Intact  Orientation:  Full (Time, Place, and Person)  Thought Content: Logical   Suicidal Thoughts:  No  Homicidal Thoughts:  No  Memory:  Immediate;   Fair  Judgement:  Intact  Insight:  Shallow  Psychomotor Activity:  Normal  Concentration:  Attention Span: Fair  Recall:  [employer] of Knowledge: Fair  Language: Good  Akathisia:  Negative  Handed:  Right  AIMS (if indicated): not done  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  ADL's:  Intact  Cognition: WNL  Sleep:  Good   Screenings:   Assessment and Plan:    This patient's diagnosis is that of major depression in remission.  She takes Lexapro 10 mg without a problem.  There is second problem is an adjustment disorder with an anxious mood state which is actually well controlled with Ativan in low dose 0.5 mg twice daily.  She successfully came off Xanax and actually feels much better.  The patient is doing very well.  She will return to see me in person in 4 months.  The patient has no complaints.  No emotional complaints. 1. Severe episode of recurrent major depressive disorder, without psychotic features (HCC)     Status of current problems: unchanged  Labs Ordered: No orders of the defined types were placed in this encounter.   Labs Reviewed: NA  Collateral Obtained/Records Reviewed: NA  Plan:  Continue Xanax, Lexapro, Elavil as prescribed RTC 3 MONTHS   Gypsy Balsam, MD 06/13/2020, 9:15 AM

## 2020-10-28 ENCOUNTER — Other Ambulatory Visit: Payer: Self-pay

## 2020-10-28 ENCOUNTER — Encounter: Payer: Self-pay | Admitting: Physical Therapy

## 2020-10-28 ENCOUNTER — Ambulatory Visit: Payer: Medicare HMO | Admitting: Physical Therapy

## 2020-10-28 DIAGNOSIS — M545 Low back pain, unspecified: Secondary | ICD-10-CM

## 2020-10-28 DIAGNOSIS — M6281 Muscle weakness (generalized): Secondary | ICD-10-CM | POA: Diagnosis not present

## 2020-10-28 DIAGNOSIS — G8929 Other chronic pain: Secondary | ICD-10-CM

## 2020-10-28 DIAGNOSIS — R293 Abnormal posture: Secondary | ICD-10-CM

## 2020-10-28 NOTE — Therapy (Signed)
[place] [facility], [place], [address] Phone: [phone]   Fax:  [phone]  Physical Therapy Treatment  Patient Details  Name: Carrie Austin MRN: [id number] Date of Birth: [birth year] Referring Provider (PT): Frederich Cha   Encounter Date: 10/28/2020   PT End of Session - 10/28/20 1108     Visit Number 4    Number of Visits 12    Date for PT Re-Evaluation 11/19/20    Authorization Type [employer] medicare    Authorization - Visit Number 4    Authorization - Number of Visits 12    Progress Note Due on Visit 10    PT Start Time 1103    PT Stop Time 1152    PT Time Calculation (min) 49 min    Activity Tolerance Patient tolerated treatment well    Behavior During Therapy WFL for tasks assessed/performed             Past Medical History:  Diagnosis Date   Allergy    Anxiety    Arthritis    Cough    due allergies chronic   Depression    Dilatation of esophagus    Fibromyalgia    GERD (gastroesophageal reflux disease)    Headache(784.0)    migraines   Heart murmur    one time   Hypertension    PONV (postoperative nausea and vomiting)    hx of Vertigo after anesthesia    Past Surgical History:  Procedure Laterality Date   ABDOMINAL HYSTERECTOMY     BACK SURGERY  12/21/2012   CHOLECYSTECTOMY     COLONOSCOPY     DIAGNOSTIC LAPAROSCOPY     lap chole   EYE SURGERY Bilateral    lasik   TUBAL LIGATION      There were no vitals filed for this visit.   Subjective Assessment - 10/28/20 1109     Subjective "I'm just in pain."  Pt reports she called doctor and will have an xray of ribs on 10/28.  She hasn't bought TENS yet. She has her back brace donned upon arrival.    Pertinent History laminectomy L5/S1, fusion L2-3    Patient Stated Goals decrease pain    Currently in Pain? Yes    Pain Score 7     Pain Location Rib cage    Pain Orientation Left    Pain Descriptors / Indicators  Tender;Shooting;Aching                [facility] PT Assessment - 10/28/20 0001       Assessment   Medical Diagnosis low back pain    Referring Provider (PT) Frederich Cha    Onset Date/Surgical Date 07/10/20    Next MD Visit as needed    Prior Therapy for cervical surgery              [facility] Adult PT Treatment/Exercise - 10/28/20 0001       Lumbar Exercises: Stretches   Passive Hamstring Stretch Right;Left;2 reps   15 sec   Lower Trunk Rotation 5 reps;10 seconds   feet wide for hip stretch   Hip Flexor Stretch Right;Left;20 seconds;2 reps   seated with leg back, arm reach to ceiling.   Piriformis Stretch Right;Left;2 reps;10 seconds    Other Lumbar Stretch Exercise sliding arms up cabinets into flexion x 3 reps, then with slight side bend for lat stretch      Lumbar Exercises: Aerobic   Other Aerobic Exercise laps  around gym x 4 minutes for warm up      Lumbar Exercises: Standing   Row Strengthening;10 reps;Theraband   2 sets   Theraband Level (Row) Level 2 (Red)    Shoulder Extension Strengthening;Both;15reps    Theraband Level (Shoulder Extension) Level 1 (Yellow)    Shoulder Extension Limitations cues for form.      Lumbar Exercises: Supine   Bridge 10 reps;5 seconds      Lumbar Exercises: Sidelying   Clam Left;10 reps      Moist Heat Therapy   Number Minutes Moist Heat 10 Minutes    Moist Heat Location Lumbar Spine      Electrical Stimulation   Electrical Stimulation Location Lt QL and bilat low back    Electrical Stimulation Action TENS    Electrical Stimulation Parameters 10 min, normal mode, intensity to tolerance    Electrical Stimulation Goals Pain      Manual Therapy   Manual therapy comments Pt tender to palpation of Lt iliocostalis at thoracic region.                          PT Long Term Goals - 10/28/20 1135       PT LONG TERM GOAL #1   Title Pt will be independent with HEP    Time 6    Period Weeks    Status On-going       PT LONG TERM GOAL #2   Title Pt will improve FOTO to >= 62 to demo improved functional mobility    Time 6    Period Weeks    Status On-going      PT LONG TERM GOAL #3   Title Pt will improve bilat LE strength to 4+/5 to tolerate standing with decreased pain    Time 6    Period Weeks    Status On-going      PT LONG TERM GOAL #4   Title Pt will tolerate standing x 30 minutes with pain <= 2/10    Baseline can stand 30 min with pain up to 6/10    Time 6    Period Weeks    Status Partially Met                   Plan - 10/28/20 1144     Clinical Impression Statement Pt reported increase in Lt midback/rib pain with Lt lateral trunk flexion. Reduction of pain in back with supine exercises and use of MHP/estim.  Progressing gradually towards goals.    PT Next Visit Plan  Core/leg strength, manual/modalities as indicated    PT Home Exercise Plan [id number]    Consulted and Agree with Plan of Care Patient             Patient will benefit from skilled therapeutic intervention in order to improve the following deficits and impairments:     Visit Diagnosis: Chronic bilateral low back pain without sciatica  Muscle weakness (generalized)  Abnormal posture     Problem List Patient Active Problem List   Diagnosis Date Noted   Spinal stenosis of lumbar region with neurogenic claudication 07/10/2020   Migraine without aura and without status migrainosus, not intractable 07/09/2019   Loss of consciousness ([place]) 07/09/2019   GERD (gastroesophageal reflux disease) 10/11/2016   Rheumatoid arthritis involving both hands ([place]) 09/27/2016   Lumbar stenosis with neurogenic claudication 05/17/2016   Impingement syndrome of right shoulder 12/01/2015   Acute superficial gastritis without  hemorrhage 07/01/2015   Varicose veins of both lower extremities with pain 05/22/2015   Age-related osteoporosis without current pathological fracture 05/12/2015   Chronic pain syndrome  05/12/2015   Essential hypertension 05/12/2015   Urge incontinence 05/12/2015   Recurrent major depressive disorder, in remission ([place]) 05/12/2015   Mixed hyperlipidemia 05/12/2015   Severe episode of recurrent major depressive disorder, without psychotic features ([place]) 05/12/2015   Obstipation 04/14/2015   Irritable bowel syndrome with constipation 02/26/2015   Other allergic rhinitis 12/26/2013   Spondylolisthesis 12/21/2012   Left lumbar radiculopathy 07/05/2012   Left leg pain 07/05/2012   Left cervical radiculopathy 07/05/2012   Dysphagia 09/02/2011   GERD with stricture 06/22/2011   Stricture of esophagus 06/22/2011   Kerin Perna, PTA 10/28/20 11:51 AM  [place] [facility] [place] [address] [place], [place], [address] Phone: [phone]   Fax:  [phone]  Name: Carrie Austin MRN: [id number] Date of Birth: [birth year]

## 2020-10-31 ENCOUNTER — Other Ambulatory Visit: Payer: Self-pay

## 2020-10-31 ENCOUNTER — Ambulatory Visit: Payer: Medicare HMO | Admitting: Physical Therapy

## 2020-10-31 DIAGNOSIS — R293 Abnormal posture: Secondary | ICD-10-CM

## 2020-10-31 DIAGNOSIS — M6281 Muscle weakness (generalized): Secondary | ICD-10-CM

## 2020-10-31 DIAGNOSIS — G8929 Other chronic pain: Secondary | ICD-10-CM

## 2020-10-31 DIAGNOSIS — M545 Low back pain, unspecified: Secondary | ICD-10-CM

## 2020-10-31 NOTE — Therapy (Signed)
[place], [place], [address] Phone: [phone]   Fax:  [phone]  Physical Therapy Treatment  Patient Details  Name: Carrie Austin MRN: [id number] Date of Birth: [birth year] Referring Provider (PT): Frederich Cha   Encounter Date: 10/31/2020   PT End of Session - 10/31/20 1132     Visit Number 5    Number of Visits 12    Date for PT Re-Evaluation 11/19/20    Authorization Type [employer] medicare    Authorization - Visit Number 5    Authorization - Number of Visits 12    Progress Note Due on Visit 10    PT Start Time 1058    PT Stop Time 5456    PT Time Calculation (min) 44 min    Activity Tolerance Patient tolerated treatment well    Behavior During Therapy WFL for tasks assessed/performed             Past Medical History:  Diagnosis Date   Allergy    Anxiety    Arthritis    Cough    due allergies chronic   Depression    Dilatation of esophagus    Fibromyalgia    GERD (gastroesophageal reflux disease)    Headache(784.0)    migraines   Heart murmur    one time   Hypertension    PONV (postoperative nausea and vomiting)    hx of Vertigo after anesthesia    Past Surgical History:  Procedure Laterality Date   ABDOMINAL HYSTERECTOMY     BACK SURGERY  12/21/2012   CHOLECYSTECTOMY     COLONOSCOPY     DIAGNOSTIC LAPAROSCOPY     lap chole   EYE SURGERY Bilateral    lasik   TUBAL LIGATION      There were no vitals filed for this visit.   Subjective Assessment - 10/31/20 1101     Subjective Pt states her pain continues to be bad any time she is up. Her only relief is ice and laying down. She rec'd her home TENS machine    Patient Stated Goals decrease pain    Currently in Pain? Yes    Pain Score 5     Pain Location Rib cage    Pain Orientation Left    Pain Descriptors / Indicators Shooting    Pain Type Chronic pain                [facility] PT Assessment -  10/31/20 0001       Assessment   Medical Diagnosis low back pain    Referring Provider (PT) Frederich Cha    Onset Date/Surgical Date 07/10/20    Next MD Visit 11/07/20    Prior Therapy for cervical surgery      AROM   Lumbar Flexion limited 50% - pain    Lumbar Extension to neutral - pain    Lumbar - Right Side Bend 75% limited - pain    Lumbar - Left Side Bend 75% limited - pain    Lumbar - Right Rotation 50% limited - pain    Lumbar - Left Rotation 75% limited - pain                           [facility] Adult PT Treatment/Exercise - 10/31/20 0001       Lumbar Exercises: Stretches   Passive Hamstring Stretch Right;Left;2 reps;30 seconds    Lower Trunk Rotation 5 reps;10  seconds   feet wide for hip stretch   Hip Flexor Stretch Right;Left;2 reps;30 seconds    Hip Flexor Stretch Limitations cues for posture and technique      Lumbar Exercises: Aerobic   Other Aerobic Exercise laps around gym x 4 minutes for warm up      Lumbar Exercises: Standing   Row Strengthening;10 reps;Theraband   2 sets   Theraband Level (Row) Level 2 (Red)    Shoulder Extension Strengthening;20 reps    Theraband Level (Shoulder Extension) Level 2 (Red)    Other Standing Lumbar Exercises pallof press x 5 bilat red TB, cues for technique      Lumbar Exercises: Supine   Bridge 10 reps;5 seconds      Lumbar Exercises: Sidelying   Clam Right;Left;20 reps      Moist Heat Therapy   Number Minutes Moist Heat 10 Minutes    Moist Heat Location Lumbar Spine      Electrical Stimulation   Electrical Stimulation Location bilat QL    Electrical Stimulation Action TENS   Pt's home unit   Electrical Stimulation Parameters to tolerance    Electrical Stimulation Goals Pain      Manual Therapy   Soft tissue mobilization gentle STM Lt iliocostalis as tolerated                     PT Education - 10/31/20 1132     Education Details home TENS unit    Person(s) Educated Patient     Methods Explanation;Demonstration    Comprehension Verbalized understanding;Returned demonstration                 PT Long Term Goals - 10/28/20 1135       PT LONG TERM GOAL #1   Title Pt will be independent with HEP    Time 6    Period Weeks    Status On-going      PT LONG TERM GOAL #2   Title Pt will improve FOTO to >= 62 to demo improved functional mobility    Time 6    Period Weeks    Status On-going      PT LONG TERM GOAL #3   Title Pt will improve bilat LE strength to 4+/5 to tolerate standing with decreased pain    Time 6    Period Weeks    Status On-going      PT LONG TERM GOAL #4   Title Pt will tolerate standing x 30 minutes with pain <= 2/10    Baseline can stand 30 min with pain up to 6/10    Time 6    Period Weeks    Status Partially Met                   Plan - 10/31/20 1132     Clinical Impression Statement Pt demonstrates improved lumbar flexion and Right rotation ROM. Continues with signficant pain limiting activity tolerance and IADLs. Pt with good tolerance to therex and good relief with TENS machine. PT educated pt on home TENS unit    PT Next Visit Plan update HEP, core strength    PT Home Exercise Plan [id number]    Consulted and Agree with Plan of Care Patient             Patient will benefit from skilled therapeutic intervention in order to improve the following deficits and impairments:     Visit Diagnosis: Chronic bilateral low back pain without  sciatica  Muscle weakness (generalized)  Abnormal posture     Problem List Patient Active Problem List   Diagnosis Date Noted   Spinal stenosis of lumbar region with neurogenic claudication 07/10/2020   Migraine without aura and without status migrainosus, not intractable 07/09/2019   Loss of consciousness ([place]) 07/09/2019   GERD (gastroesophageal reflux disease) 10/11/2016   Rheumatoid arthritis involving both hands ([place]) 09/27/2016   Lumbar stenosis with neurogenic  claudication 05/17/2016   Impingement syndrome of right shoulder 12/01/2015   Acute superficial gastritis without hemorrhage 07/01/2015   Varicose veins of both lower extremities with pain 05/22/2015   Age-related osteoporosis without current pathological fracture 05/12/2015   Chronic pain syndrome 05/12/2015   Essential hypertension 05/12/2015   Urge incontinence 05/12/2015   Recurrent major depressive disorder, in remission ([place]) 05/12/2015   Mixed hyperlipidemia 05/12/2015   Severe episode of recurrent major depressive disorder, without psychotic features ([place]) 05/12/2015   Obstipation 04/14/2015   Irritable bowel syndrome with constipation 02/26/2015   Other allergic rhinitis 12/26/2013   Spondylolisthesis 12/21/2012   Left lumbar radiculopathy 07/05/2012   Left leg pain 07/05/2012   Left cervical radiculopathy 07/05/2012   Dysphagia 09/02/2011   GERD with stricture 06/22/2011   Stricture of esophagus 06/22/2011    Sukanya Goldblatt, PT 10/31/2020, 11:34 AM  [facility] [place], [place], [address] Phone: [phone]   Fax:  [phone]  Name: DELISSA SILBA MRN: [id number] Date of Birth: Nov 13, 1945

## 2020-11-03 ENCOUNTER — Other Ambulatory Visit: Payer: Self-pay

## 2020-11-03 ENCOUNTER — Ambulatory Visit: Payer: Medicare HMO | Admitting: Physical Therapy

## 2020-11-03 DIAGNOSIS — M6281 Muscle weakness (generalized): Secondary | ICD-10-CM

## 2020-11-03 DIAGNOSIS — G8929 Other chronic pain: Secondary | ICD-10-CM

## 2020-11-03 DIAGNOSIS — M545 Low back pain, unspecified: Secondary | ICD-10-CM

## 2020-11-03 DIAGNOSIS — R293 Abnormal posture: Secondary | ICD-10-CM | POA: Diagnosis not present

## 2020-11-03 NOTE — Therapy (Signed)
[place], [place], [address] Phone: [phone]   Fax:  [phone]  Physical Therapy Treatment  Patient Details  Name: Carrie Austin MRN: [id number] Date of Birth: 15-Jun-1945 Referring Provider (PT): Frederich Cha   Encounter Date: 11/03/2020   PT End of Session - 11/03/20 1013     Visit Number 6    Number of Visits 12    Date for PT Re-Evaluation 11/19/20    Authorization Type [employer] medic[employer]    Authorization - Visit Number 6    Authorization - Number of Visits 12    Progress Note Due on Visit 10    PT Start Time 0930    PT Stop Time 1017    PT Time Calculation (min) 47 min    Activity Tolerance Patient tolerated treatment well;Patient limited by pain    Behavior During Therapy [facility] for tasks assessed/performed             Past Medical History:  Diagnosis Date   Allergy    Anxiety    Arthritis    Cough    due allergies chronic   Depression    Dilatation of esophagus    Fibromyalgia    GERD (gastroesophageal reflux disease)    Headache(784.0)    migraines   Heart murmur    one time   Hypertension    PONV (postoperative nausea and vomiting)    hx of Vertigo after anesthesia    Past Surgical History:  Procedure Laterality Date   ABDOMINAL HYSTERECTOMY     BACK SURGERY  12/21/2012   CHOLECYSTECTOMY     COLONOSCOPY     DIAGNOSTIC LAPAROSCOPY     lap chole   EYE SURGERY Bilateral    lasik   TUBAL LIGATION      There were no vitals filed for this visit.   Subjective Assessment - 11/03/20 0937     Subjective Pt states she bought KT tape and applied it across her mid/low back.  It provided some relief yesterday.   She states she thinks she may have some broken ribs.    Pertinent History laminectomy L5/S1, fusion L2-3    Patient Stated Goals decrease pain    Currently in Pain? Yes    Pain Score 7     Pain Location Rib cage    Pain Orientation Left    Pain  Descriptors / Indicators Constant;Sharp    Aggravating Factors  standing up straight    Pain Relieving Factors medication                [facility] PT Assessment - 11/03/20 0001       Assessment   Medical Diagnosis low back pain    Referring Provider (PT) Frederich Cha    Onset Date/Surgical Date 07/10/20    Next MD Visit 11/07/20    Prior Therapy for cervical surgery               [facility] Adult PT Treatment/Exercise - 11/03/20 0001       Lumbar Exercises: Stretches   Hip Flexor Stretch Right;Left;2 reps;20 seconds    Standing Extension 3 reps   minimal motion, with hands on counter.   Other Lumbar Stretch Exercise sliding arms up cabinets into flexion x 4 reps    Other Lumbar Stretch Exercise L stretch x 3 reps      Lumbar Exercises: Aerobic   Nustep L4: arms/legs (slow pace) x 5.25 min for warm up.  Other Aerobic Exercise laps around gym x 2 minutes for warm up      Lumbar Exercises: Standing   Row Strengthening;Both;20 reps;Theraband    Theraband Level (Row) Level 2 (Red)    Shoulder Extension Strengthening;Both;15 reps    Theraband Level (Shoulder Extension) Level 1 (Yellow)    Other Standing Lumbar Exercises pallof press x 5 bilat yellow TB, cues for technique      Moist Heat Therapy   Number Minutes Moist Heat 10 Minutes    Moist Heat Location Lumbar Spine      Electrical Stimulation   Electrical Stimulation Location bilat QL    Electrical Stimulation Action TENS    Electrical Stimulation Parameters 10 min, intensity to tolerance    Electrical Stimulation Goals Pain      Manual Therapy   Manual Therapy Soft tissue mobilization;Taping    Soft tissue mobilization gentle STM to Lt QL, erector spinae to decrease fascial restrictions and decrease pain.    Kinesiotex Facilitate Muscle      Kinesiotix   Facilitate Muscle  I strip of reg Rock tape applied to Lt lateral post ribs upward towards midline (diagonal line) with 25% pull, to decompress tissue and  increase proprioception to area.                          PT Long Term Goals - 10/28/20 1135       PT LONG TERM GOAL #1   Title Pt will be independent with HEP    Time 6    Period Weeks    Status On-going      PT LONG TERM GOAL #2   Title Pt will improve FOTO to >= 62 to demo improved functional mobility    Time 6    Period Weeks    Status On-going      PT LONG TERM GOAL #3   Title Pt will improve bilat LE strength to 4+/5 to tolerate standing with decreased pain    Time 6    Period Weeks    Status On-going      PT LONG TERM GOAL #4   Title Pt will tolerate standing x 30 minutes with pain <= 2/10    Baseline can stand 30 min with pain up to 6/10    Time 6    Period Weeks    Status Partially Met                   Plan - 11/03/20 1011     Clinical Impression Statement Continued limited tolerance for neutral to lumbar extension in standing.  Pain level remained unchanged with exercises.  Pain decreases in hooklying position with estim/MHP.  Trial of Rock tape applied to rib area of concern.  Goals are ongoing.    PT Frequency 2x / week    PT Duration 6 weeks    PT Treatment/Interventions Dry needling;Manual techniques;Taping;Neuromuscular re-education;Balance training;Patient/family education;Therapeutic exercise;Therapeutic activities;Moist Heat;Electrical Stimulation;Aquatic Therapy;Cryotherapy    PT Next Visit Plan update HEP, core strength.  assess response to tape.    PT Home Exercise Plan [id number]    Consulted and Agree with Plan of Care Patient             Patient will benefit from skilled therapeutic intervention in order to improve the following deficits and impairments:  Postural dysfunction, Pain, Decreased strength, Decreased activity tolerance, Decreased range of motion, Hypomobility  Visit Diagnosis: Chronic bilateral low back pain without sciatica    Muscle weakness (generalized)  Abnormal posture     Problem  List Patient Active Problem List   Diagnosis Date Noted   Spinal stenosis of lumbar region with neurogenic claudication 07/10/2020   Migraine without aura and without status migrainosus, not intractable 07/09/2019   Loss of consciousness ([facility]) 07/09/2019   GERD (gastroesophageal reflux disease) 10/11/2016   Rheumatoid arthritis involving both hands ([facility]) 09/27/2016   Lumbar stenosis with neurogenic claudication 05/17/2016   Impingement syndrome of right shoulder 12/01/2015   Acute superficial gastritis without hemorrhage 07/01/2015   Varicose veins of both lower extremities with pain 05/22/2015   Age-related osteoporosis without current pathological fracture 05/12/2015   Chronic pain syndrome 05/12/2015   Essential hypertension 05/12/2015   Urge incontinence 05/12/2015   Recurrent major depressive disorder, in remission ([facility]) 05/12/2015   Mixed hyperlipidemia 05/12/2015   Severe episode of recurrent major depressive disorder, without psychotic features ([facility]) 05/12/2015   Obstipation 04/14/2015   Irritable bowel syndrome with constipation 02/26/2015   Other allergic rhinitis 12/26/2013   Spondylolisthesis 12/21/2012   Left lumbar radiculopathy 07/05/2012   Left leg pain 07/05/2012   Left cervical radiculopathy 07/05/2012   Dysphagia 09/02/2011   GERD with stricture 06/22/2011   Stricture of esophagus 06/22/2011   Jennifer Carlson-Long, PTA 11/03/20 10:24 AM  [facility] [address] [place], [place], [address] Phone: [phone]   Fax:  [phone]  Name: Carrie Austin MRN: [id number] Date of Birth: 05/28/1945    

## 2020-11-05 ENCOUNTER — Other Ambulatory Visit: Payer: Self-pay

## 2020-11-05 ENCOUNTER — Ambulatory Visit (INDEPENDENT_AMBULATORY_CARE_PROVIDER_SITE_OTHER): Payer: Medicare HMO | Admitting: Physical Therapy

## 2020-11-05 DIAGNOSIS — R293 Abnormal posture: Secondary | ICD-10-CM

## 2020-11-05 DIAGNOSIS — M545 Low back pain, unspecified: Secondary | ICD-10-CM

## 2020-11-05 DIAGNOSIS — M6281 Muscle weakness (generalized): Secondary | ICD-10-CM | POA: Diagnosis not present

## 2020-11-05 DIAGNOSIS — G8929 Other chronic pain: Secondary | ICD-10-CM | POA: Diagnosis not present

## 2020-11-05 NOTE — Therapy (Addendum)
[place], [place], [address] Phone: [phone]   Fax:  [phone]  Physical Therapy Treatment and Discharge  Patient Details  Name: Carrie Austin MRN: [id number] Date of Birth: 1945/05/07 Referring Provider (PT): Frederich Cha   Encounter Date: 11/05/2020   PT End of Session - 11/05/20 1107     Visit Number 7    Number of Visits 12    Date for PT Re-Evaluation 11/19/20    Authorization Type [employer] medicare    Authorization - Visit Number 7    Authorization - Number of Visits 12    Progress Note Due on Visit 10    PT Start Time 1021    PT Stop Time 1107    PT Time Calculation (min) 46 min    Activity Tolerance Patient tolerated treatment well;Patient limited by pain    Behavior During Therapy [facility] for tasks assessed/performed             Past Medical History:  Diagnosis Date   Allergy    Anxiety    Arthritis    Cough    due allergies chronic   Depression    Dilatation of esophagus    Fibromyalgia    GERD (gastroesophageal reflux disease)    Headache(784.0)    migraines   Heart murmur    one time   Hypertension    PONV (postoperative nausea and vomiting)    hx of Vertigo after anesthesia    Past Surgical History:  Procedure Laterality Date   ABDOMINAL HYSTERECTOMY     BACK SURGERY  12/21/2012   CHOLECYSTECTOMY     COLONOSCOPY     DIAGNOSTIC LAPAROSCOPY     lap chole   EYE SURGERY Bilateral    lasik   TUBAL LIGATION      There were no vitals filed for this visit.   Subjective Assessment - 11/05/20 1024     Subjective Pt reports the tape helped some; would like to be taped again.  She has one spot that she wishes she could put a needle in to make it stop hurting.  She reports some increase in soreness from cleaning house yesterday.    Pertinent History laminectomy L5/S1, fusion L2-3    Currently in Pain? Yes    Pain Score 7     Pain Location Rib cage     Pain Orientation Left;Mid    Pain Descriptors / Indicators Sharp;Constant    Aggravating Factors  standing up straight    Pain Relieving Factors ice, medication, TENS                [facility] PT Assessment - 11/05/20 0001       Assessment   Medical Diagnosis low back pain    Referring Provider (PT) Frederich Cha    Onset Date/Surgical Date 07/10/20    Next MD Visit 11/07/20    Prior Therapy for cervical surgery      Observation/Other Assessments   Focus on Therapeutic Outcomes (FOTO)  59 functional status      Strength   Right Hip Extension 4/5    Left Hip Extension 4/5               [facility] Adult PT Treatment/Exercise - 11/05/20 0001       Lumbar Exercises: Stretches   Other Lumbar Stretch Exercise sliding arms up cabinets into flexion x 4 reps, shoulder rolls x 5    Other Lumbar Stretch Exercise L stretch  x 3 reps      Lumbar Exercises: Aerobic   Nustep L4: arms/legs (slow pace) x 4 min for warm up.    Other Aerobic Exercise laps around gym x 2 minutes for warm up      Lumbar Exercises: Standing   Row Strengthening;Both;10 reps    Theraband Level (Row) Level 3 (Green)    Row Limitations and bow and arrow with step back x 7 reps each side, green band    Other Standing Lumbar Exercises holding yoga bloc with high knee marching x 10, 2 sets.  then marching with arms holding block at 90 deg.      Lumbar Exercises: Prone   Straight Leg Raise 5 reps;2 seconds      Lumbar Exercises: Quadruped   [place]/Old Horse 5 reps   limited range.     Moist Heat Therapy   Number Minutes Moist Heat 10 Minutes    Moist Heat Location Lumbar Spine      Electrical Stimulation   Electrical Stimulation Location bilat mid back erectors    Electrical Stimulation Action IFC    Electrical Stimulation Parameters 10 min, intensity to tolerance    Electrical Stimulation Goals Pain      Manual Therapy   Soft tissue mobilization gentle STM to Lt/Rt QL, erector spinae to decrease  fascial restrictions and decrease pain.      Kinesiotix   Facilitate Muscle  I strip of reg Rock tape applied to Lt/Rt lateral post ribs upward towards midline (diagonal line) with 25% pull, to decompress tissue and increase proprioception to area.  Educated pt on application and safe removal.                          PT Long Term Goals - 11/05/20 1131       PT LONG TERM GOAL #1   Title Pt will be independent with HEP    Time 6    Period Weeks    Status On-going      PT LONG TERM GOAL #2   Title Pt will improve FOTO to >= 62 to demo improved functional mobility    Time 6    Period Weeks    Status On-going      PT LONG TERM GOAL #3   Title Pt will improve bilat LE strength to 4+/5 to tolerate standing with decreased pain    Time 6    Period Weeks    Status Partially Met      PT LONG TERM GOAL #4   Title Pt will tolerate standing x 30 minutes with pain <= 2/10    Baseline can stand 30 min with pain up to 6/10    Time 6    Period Weeks    Status Partially Met                   Plan - 11/05/20 1108     Clinical Impression Statement Pt tolerates exercises during session with minor reduction of pain.  Further reduction of pain with estim/MHP.  Pt has partially met her goals.  She requests to hold therapy until after upcoming MD appt.    PT Frequency 2x / week    PT Duration 6 weeks    PT Treatment/Interventions Dry needling;Manual techniques;Taping;Neuromuscular re-education;Balance training;Patient/family education;Therapeutic exercise;Therapeutic activities;Moist Heat;Electrical Stimulation;Aquatic Therapy;Cryotherapy    PT Next Visit Plan if pt returns, will continue core and back strengthening.    PT Home Exercise  Plan [id number]    Consulted and Agree with Plan of Care Patient             Patient will benefit from skilled therapeutic intervention in order to improve the following deficits and impairments:  Postural dysfunction, Pain,  Decreased strength, Decreased activity tolerance, Decreased range of motion, Hypomobility  Visit Diagnosis: Chronic bilateral low back pain without sciatica  Muscle weakness (generalized)  Abnormal posture     Problem List Patient Active Problem List   Diagnosis Date Noted   Spinal stenosis of lumbar region with neurogenic claudication 07/10/2020   Migraine without aura and without status migrainosus, not intractable 07/09/2019   Loss of consciousness ([place]) 07/09/2019   GERD (gastroesophageal reflux disease) 10/11/2016   Rheumatoid arthritis involving both hands ([place]) 09/27/2016   Lumbar stenosis with neurogenic claudication 05/17/2016   Impingement syndrome of right shoulder 12/01/2015   Acute superficial gastritis without hemorrhage 07/01/2015   Varicose veins of both lower extremities with pain 05/22/2015   Age-related osteoporosis without current pathological fracture 05/12/2015   Chronic pain syndrome 05/12/2015   Essential hypertension 05/12/2015   Urge incontinence 05/12/2015   Recurrent major depressive disorder, in remission ([place]) 05/12/2015   Mixed hyperlipidemia 05/12/2015   Severe episode of recurrent major depressive disorder, without psychotic features ([place]) 05/12/2015   Obstipation 04/14/2015   Irritable bowel syndrome with constipation 02/26/2015   Other allergic rhinitis 12/26/2013   Spondylolisthesis 12/21/2012   Left lumbar radiculopathy 07/05/2012   Left leg pain 07/05/2012   Left cervical radiculopathy 07/05/2012   Dysphagia 09/02/2011   GERD with stricture 06/22/2011   Stricture of esophagus 06/22/2011   PHYSICAL THERAPY DISCHARGE SUMMARY  Visits from Start of Care: 7  Current functional level related to goals / functional outcomes: Improved strength    Remaining deficits: See above   Education / Equipment: HEP   Patient agrees to discharge. Patient goals were partially met. Patient is being discharged due to not returning since the last  visit. Isabelle Course, PT,DPT11/11/229:51 AM   Kerin Perna, PTA 11/05/20 11:35 AM   [facility] [place], [place], [address] Phone: [phone]   Fax:  [phone]  Name: Carrie Austin MRN: [id number] Date of Birth: [birth year]

## 2020-11-12 ENCOUNTER — Other Ambulatory Visit: Payer: Self-pay | Admitting: Neurosurgery

## 2020-11-12 DIAGNOSIS — M5414 Radiculopathy, thoracic region: Secondary | ICD-10-CM

## 2020-11-14 ENCOUNTER — Other Ambulatory Visit: Payer: Self-pay

## 2020-11-14 ENCOUNTER — Ambulatory Visit
Admission: RE | Admit: 2020-11-14 | Discharge: 2020-11-14 | Disposition: A | Discharge status: Home / Self Care | Payer: Medicare HMO | Source: Ambulatory Visit | Attending: Neurosurgery | Admitting: Neurosurgery

## 2020-11-14 DIAGNOSIS — M5414 Radiculopathy, thoracic region: Secondary | ICD-10-CM

## 2020-11-24 ENCOUNTER — Other Ambulatory Visit: Payer: Self-pay | Admitting: Neurosurgery

## 2020-12-08 ENCOUNTER — Other Ambulatory Visit: Payer: Self-pay | Admitting: Neurosurgery

## 2020-12-10 NOTE — Progress Notes (Signed)
Surgical Instructions    Your procedure is scheduled on 12/15/20.  Report to [facility] Main Entrance "A" at 5:30 A.M., then check in with the Admitting office.  Call this number if you have problems the morning of surgery:  [phone]   If you have any questions prior to your surgery date call [phone]: Open Monday-Friday 8am-4pm    Remember:  Do not eat after midnight the night before your surgery  You may drink clear liquids until 4:30 the morning of your surgery.   Clear liquids allowed are: Water, Non-Citrus Juices (without pulp), Carbonated Beverages, Clear Tea, Black Coffee ONLY (NO MILK, CREAM OR POWDERED CREAMER of any kind), and Gatorade    Take these medicines the morning of surgery with A SIP OF WATER: diltiazem (CARDIZEM CD)  escitalopram (LEXAPRO)  gabapentin (NEURONTIN)  LORazepam (ATIVAN)  AS NEEDED: oxyCODONE-acetaminophen (PERCOCET/ROXICET)  promethazine (PHENERGAN) Artificial Tear Solution (SOOTHE XP OP)   As of today, STOP taking any Aspirin (unless otherwise instructed by your surgeon) meloxicam (MOBIC), Aleve, Naproxen, Ibuprofen, Motrin, Advil, Goody's, BC's, all herbal medications, fish oil, and all vitamins.     After your COVID test   You are not required to quarantine however you are required to wear a well-fitting mask when you are out and around people not in your household.  If your mask becomes wet or soiled, replace with a new one.  Wash your hands often with soap and water for 20 seconds or clean your hands with an alcohol-based hand sanitizer that contains at least 60% alcohol.  Do not share personal items.  Notify your provider: if you are in close contact with someone who has COVID  or if you develop a fever of 100.4 or greater, sneezing, cough, sore throat, shortness of breath or body aches.             Do not wear jewelry or makeup Do not wear lotions, powders, perfumes or deodorant. Do not shave 48 hours prior to surgery.   Do not bring valuables to the hospital. DO Not wear nail polish, gel polish, artificial nails, or any other type of covering on natural nails including finger and toenails. If patients have artificial nails, gel coating, etc. that need to be removed by a nail salon, please have this removed prior to surgery or surgery may need to be canceled/delayed if the surgeon/ anesthesia feels like the patient is unable to be adequately monitored.             [facility] is not responsible for any belongings or valuables.  Do NOT Smoke (Tobacco/Vaping)  24 hours prior to your procedure  If you use a CPAP at night, you may bring your mask for your overnight stay.   Contacts, glasses, hearing aids, dentures or partials may not be worn into surgery, please bring cases for these belongings   For patients admitted to the hospital, discharge time will be determined by your treatment team.   Patients discharged the day of surgery will not be allowed to drive home, and someone needs to stay with them for 24 hours.  NO VISITORS WILL BE ALLOWED IN PRE-OP WHERE PATIENTS ARE PREPPED FOR SURGERY.  ONLY 1 SUPPORT PERSON MAY BE PRESENT IN THE WAITING ROOM WHILE YOU ARE IN SURGERY.  IF YOU ARE TO BE ADMITTED, ONCE YOU ARE IN YOUR ROOM YOU WILL BE ALLOWED TWO (2) VISITORS. 1 (ONE) VISITOR MAY STAY OVERNIGHT BUT MUST ARRIVE TO THE ROOM BY 8pm.  Minor children  may have two parents present. Special consideration for safety and communication needs will be reviewed on a case by case basis.  Special instructions:    Oral Hygiene is also important to reduce your risk of infection.  Remember - BRUSH YOUR TEETH THE MORNING OF SURGERY WITH YOUR REGULAR TOOTHPASTE   [place]- Preparing For Surgery  Before surgery, you can play an important role. Because skin is not sterile, your skin needs to be as free of germs as possible. You can reduce the number of germs on your skin by washing with CHG (chlorahexidine gluconate) Soap  before surgery.  CHG is an antiseptic cleaner which kills germs and bonds with the skin to continue killing germs even after washing.     Please do not use if you have an allergy to CHG or antibacterial soaps. If your skin becomes reddened/irritated stop using the CHG.  Do not shave (including legs and underarms) for at least 48 hours prior to first CHG shower. It is OK to shave your face.  Please follow these instructions carefully.     Shower the NIGHT BEFORE SURGERY and the MORNING OF SURGERY with CHG Soap.   If you chose to wash your hair, wash your hair first as usual with your normal shampoo. After you shampoo, rinse your hair and body thoroughly to remove the shampoo.  Then [employer] and genitals (private parts) with your normal soap and rinse thoroughly to remove soap.  After that Use CHG Soap as you would any other liquid soap. You can apply CHG directly to the skin and wash gently with a scrungie or a clean washcloth.   Apply the CHG Soap to your body ONLY FROM THE NECK DOWN.  Do not use on open wounds or open sores. Avoid contact with your eyes, ears, mouth and genitals (private parts). Wash Face and genitals (private parts)  with your normal soap.   Wash thoroughly, paying special attention to the area where your surgery will be performed.  Thoroughly rinse your body with warm water from the neck down.  DO NOT shower/wash with your normal soap after using and rinsing off the CHG Soap.  Pat yourself dry with a CLEAN TOWEL.  Wear CLEAN PAJAMAS to bed the night before surgery  Place CLEAN SHEETS on your bed the night before your surgery  DO NOT SLEEP WITH PETS.   Day of Surgery:  Take a shower with CHG soap. Wear Clean/Comfortable clothing the morning of surgery Do not apply any deodorants/lotions.   Remember to brush your teeth WITH YOUR REGULAR TOOTHPASTE.   Please read over the following fact sheets that you were given.

## 2020-12-11 ENCOUNTER — Encounter (HOSPITAL_COMMUNITY)
Admission: RE | Admit: 2020-12-11 | Discharge: 2020-12-11 | Disposition: A | Discharge status: Home / Self Care | Payer: Medicare HMO | Source: Ambulatory Visit | Attending: Neurosurgery | Admitting: Neurosurgery

## 2020-12-11 ENCOUNTER — Other Ambulatory Visit: Payer: Self-pay

## 2020-12-11 ENCOUNTER — Encounter (HOSPITAL_COMMUNITY): Payer: Self-pay

## 2020-12-11 VITALS — BP 150/50 | HR 64 | Temp 98.3°F | Resp 17 | Ht 60.0 in | Wt 93.6 lb

## 2020-12-11 DIAGNOSIS — I10 Essential (primary) hypertension: Secondary | ICD-10-CM | POA: Insufficient documentation

## 2020-12-11 DIAGNOSIS — R001 Bradycardia, unspecified: Secondary | ICD-10-CM | POA: Insufficient documentation

## 2020-12-11 DIAGNOSIS — E876 Hypokalemia: Secondary | ICD-10-CM | POA: Diagnosis not present

## 2020-12-11 DIAGNOSIS — Z20822 Contact with and (suspected) exposure to covid-19: Secondary | ICD-10-CM | POA: Insufficient documentation

## 2020-12-11 DIAGNOSIS — Z01818 Encounter for other preprocedural examination: Secondary | ICD-10-CM | POA: Diagnosis not present

## 2020-12-11 LAB — CBC
HCT: 36.7 % (ref 36.0–46.0)
Hemoglobin: 12.3 g/dL (ref 12.0–15.0)
MCH: 29.1 pg (ref 26.0–34.0)
MCHC: 33.5 g/dL (ref 30.0–36.0)
MCV: 87 fL (ref 80.0–100.0)
Platelets: 208 10*3/uL (ref 150–400)
RBC: 4.22 MIL/uL (ref 3.87–5.11)
RDW: 14 % (ref 11.5–15.5)
WBC: 6.5 10*3/uL (ref 4.0–10.5)
nRBC: 0 % (ref 0.0–0.2)

## 2020-12-11 LAB — BASIC METABOLIC PANEL
Anion gap: 8 (ref 5–15)
BUN: 13 mg/dL (ref 8–23)
CO2: 34 mmol/L — ABNORMAL HIGH (ref 22–32)
Calcium: 9 mg/dL (ref 8.9–10.3)
Chloride: 93 mmol/L — ABNORMAL LOW (ref 98–111)
Creatinine, Ser: 0.82 mg/dL (ref 0.44–1.00)
GFR, Estimated: >60 mL/min (ref >60)
Glucose, Bld: 122 mg/dL — ABNORMAL HIGH (ref 70–99)
Potassium: 3 mmol/L — ABNORMAL LOW (ref 3.5–5.1)
Sodium: 135 mmol/L (ref 135–145)

## 2020-12-11 LAB — TYPE AND SCREEN
ABO/RH(D): AB POS
Antibody Screen: NEGATIVE

## 2020-12-11 LAB — SURGICAL PCR SCREEN
MRSA, PCR: NEGATIVE
Staphylococcus aureus: NEGATIVE

## 2020-12-11 NOTE — Progress Notes (Signed)
PCP - Dr. Deforest Hoyles Cardiologist - Denies  PPM/ICD - Denies  Chest x-ray - N/A EKG - 12/11/20 Stress Test - 09/05/19 ECHO - 08/06/11 Cardiac Cath - Denies  Sleep Study - Denies  Diabetic: Denies  Blood Thinner Instructions: Denies Aspirin Instructions: Denies  ERAS Protcol - No  COVID TEST- 12/11/20 in PAT   Anesthesia review: Yes, abnormal EKG  Patient denies shortness of breath, fever, cough and chest pain at PAT appointment   All instructions explained to the patient, with a verbal understanding of the material. Patient agrees to go over the instructions while at home for a better understanding. Patient also instructed to self quarantine after being tested for COVID-19. The opportunity to ask questions was provided.

## 2020-12-12 LAB — SARS CORONAVIRUS 2 (TAT 6-24 HRS): SARS Coronavirus 2: NEGATIVE

## 2020-12-12 NOTE — Progress Notes (Signed)
Anesthesia Chart Review:  Pt saw cardiologist Dyane Dustman, MD 08/14/19 for syncope x1 (notes in care everywhere). Note indicates pt saw neurology and MRI brain and EEG were unremarkable. Event monitor and stress test ordered and were also unremarkable, results below. At f/u 09/25/19, prn follow up recommended.    Recently underwent multilevel lumbar fusion 07/10/2020 without complication.  Preop labs reviewed, mild hypokalemia with potassium 3.0, otherwise unremarkable.  EKG 12/11/2020: Sinus bradycardia.  Rate 59.  Septal infarct age undetermined.    Chest 2 view 10/31/2019 (Care Everywhere): FINDINGS:  The heart size and mediastinal contours are within normal limits. No  pneumothorax or pleural effusion is noted. No acute pulmonary  disease is noted. The visualized skeletal structures are  unremarkable.   IMPRESSION:  No active cardiopulmonary disease.  30 day cardiac event monitor (care everywhere): 1.  Normal sinus rhythm predominates during the recording  2.  Sinus bradycardia is noted during sleep, sinus tachycardia is noted during daytime hours  3.  Rare PACs, PVCs noted,  4.  Benign recording   Nuclear stress test 09/05/19 (care everywhere):  Normal cardiac perfusion exam. Prognostically this is a low risk scan  Left ventricular ejection fraction of 92%.  Normal left ventricular wall motion.     Zannie Cove [facility] Short Stay Center/Anesthesiology Phone [phone] 12/12/2020 9:42 AM

## 2020-12-12 NOTE — Anesthesia Preprocedure Evaluation (Addendum)
Anesthesia Evaluation  Patient identified by MRN, date of birth, ID band Patient awake    Reviewed: Allergy & Precautions, NPO status , Patient's Chart, lab work & pertinent test results  History of Anesthesia Complications (+) PONV and history of anesthetic complications  Airway Mallampati: I  TM Distance: >3 FB Neck ROM: Full    Dental  (+) Dental Advisory Given, Teeth Intact   Pulmonary neg pulmonary ROS,    Pulmonary exam normal        Cardiovascular hypertension, Pt. on medications Normal cardiovascular exam     Neuro/Psych  Headaches, PSYCHIATRIC DISORDERS Anxiety Depression    GI/Hepatic Neg liver ROS, GERD  Controlled,  Endo/Other   K 3.0   Renal/GU negative Renal ROS     Musculoskeletal  (+) Arthritis , Fibromyalgia -, narcotic dependent  Abdominal   Peds  Hematology negative hematology ROS (+)   Anesthesia Other Findings Chronic pain  Reproductive/Obstetrics                          Anesthesia Physical Anesthesia Plan  ASA: 2  Anesthesia Plan: General   Post-op Pain Management: Tylenol PO (pre-op) and Ketamine IV   Induction: Intravenous  PONV Risk Score and Plan: 4 or greater and Treatment may vary due to age or medical condition, Ondansetron, Aprepitant and Propofol infusion  Airway Management Planned: Oral ETT  Additional Equipment: None  Intra-op Plan:   Post-operative Plan: Extubation in OR  Informed Consent: I have reviewed the patients History and Physical, chart, labs and discussed the procedure including the risks, benefits and alternatives for the proposed anesthesia with the patient or authorized representative who has indicated his/her understanding and acceptance.     Dental advisory given  Plan Discussed with: CRNA and Anesthesiologist  Anesthesia Plan Comments:       Anesthesia Quick Evaluation

## 2020-12-12 NOTE — Progress Notes (Signed)
Surgical Instructions    Your procedure is scheduled on 12/15/20.  Report to [facility] Main Entrance "A" at 5:30 A.M., then check in with the Admitting office.  Call this number if you have problems the morning of surgery:  [phone]   If you have any questions prior to your surgery date call [phone]: Open Monday-Friday 8am-4pm    Remember:  Do not eat or drink after midnight the night before your surgery    Take these medicines the morning of surgery with A SIP OF WATER: diltiazem (CARDIZEM CD)  escitalopram (LEXAPRO)  gabapentin (NEURONTIN)  LORazepam (ATIVAN)  AS NEEDED: oxyCODONE-acetaminophen (PERCOCET/ROXICET)  promethazine (PHENERGAN) Artificial Tear Solution (SOOTHE XP OP)   As of today, STOP taking any Aspirin (unless otherwise instructed by your surgeon) meloxicam (MOBIC), Aleve, Naproxen, Ibuprofen, Motrin, Advil, Goody's, BC's, all herbal medications, fish oil, and all vitamins.     After your COVID test   You are not required to quarantine however you are required to wear a well-fitting mask when you are out and around people not in your household.  If your mask becomes wet or soiled, replace with a new one.  Wash your hands often with soap and water for 20 seconds or clean your hands with an alcohol-based hand sanitizer that contains at least 60% alcohol.  Do not share personal items.  Notify your provider: if you are in close contact with someone who has COVID  or if you develop a fever of 100.4 or greater, sneezing, cough, sore throat, shortness of breath or body aches.             Do not wear jewelry or makeup Do not wear lotions, powders, perfumes or deodorant. Do not shave 48 hours prior to surgery.  Do not bring valuables to the hospital. DO Not wear nail polish, gel polish, artificial nails, or any other type of covering on natural nails including finger and toenails. If patients have artificial nails, gel coating, etc. that need to be  removed by a nail salon, please have this removed prior to surgery or surgery may need to be canceled/delayed if the surgeon/ anesthesia feels like the patient is unable to be adequately monitored.             [facility] is not responsible for any belongings or valuables.  Do NOT Smoke (Tobacco/Vaping)  24 hours prior to your procedure  If you use a CPAP at night, you may bring your mask for your overnight stay.   Contacts, glasses, hearing aids, dentures or partials may not be worn into surgery, please bring cases for these belongings   For patients admitted to the hospital, discharge time will be determined by your treatment team.   Patients discharged the day of surgery will not be allowed to drive home, and someone needs to stay with them for 24 hours.  NO VISITORS WILL BE ALLOWED IN PRE-OP WHERE PATIENTS ARE PREPPED FOR SURGERY.  ONLY 1 SUPPORT PERSON MAY BE PRESENT IN THE WAITING ROOM WHILE YOU ARE IN SURGERY.  IF YOU ARE TO BE ADMITTED, ONCE YOU ARE IN YOUR ROOM YOU WILL BE ALLOWED TWO (2) VISITORS. 1 (ONE) VISITOR MAY STAY OVERNIGHT BUT MUST ARRIVE TO THE ROOM BY 8pm.  Minor children may have two parents present. Special consideration for safety and communication needs will be reviewed on a case by case basis.  Special instructions:    Oral Hygiene is also important to reduce your risk of infection.  Remember -  BRUSH YOUR TEETH THE MORNING OF SURGERY WITH YOUR REGULAR TOOTHPASTE   [facility]- Preparing For Surgery  Before surgery, you can play an important role. Because skin is not sterile, your skin needs to be as free of germs as possible. You can reduce the number of germs on your skin by washing with CHG (chlorahexidine gluconate) Soap before surgery.  CHG is an antiseptic cleaner which kills germs and bonds with the skin to continue killing germs even after washing.     Please do not use if you have an allergy to CHG or antibacterial soaps. If your skin becomes  reddened/irritated stop using the CHG.  Do not shave (including legs and underarms) for at least 48 hours prior to first CHG shower. It is OK to shave your face.  Please follow these instructions carefully.     Shower the NIGHT BEFORE SURGERY and the MORNING OF SURGERY with CHG Soap.   If you chose to wash your hair, wash your hair first as usual with your normal shampoo. After you shampoo, rinse your hair and body thoroughly to remove the shampoo.  Then [employer] and genitals (private parts) with your normal soap and rinse thoroughly to remove soap.  After that Use CHG Soap as you would any other liquid soap. You can apply CHG directly to the skin and wash gently with a scrungie or a clean washcloth.   Apply the CHG Soap to your body ONLY FROM THE NECK DOWN.  Do not use on open wounds or open sores. Avoid contact with your eyes, ears, mouth and genitals (private parts). Wash Face and genitals (private parts)  with your normal soap.   Wash thoroughly, paying special attention to the area where your surgery will be performed.  Thoroughly rinse your body with warm water from the neck down.  DO NOT shower/wash with your normal soap after using and rinsing off the CHG Soap.  Pat yourself dry with a CLEAN TOWEL.  Wear CLEAN PAJAMAS to bed the night before surgery  Place CLEAN SHEETS on your bed the night before your surgery  DO NOT SLEEP WITH PETS.   Day of Surgery:  Take a shower with CHG soap. Wear Clean/Comfortable clothing the morning of surgery Do not apply any deodorants/lotions.   Remember to brush your teeth WITH YOUR REGULAR TOOTHPASTE.   Please read over the following fact sheets that you were given.

## 2020-12-15 ENCOUNTER — Inpatient Hospital Stay (HOSPITAL_COMMUNITY): Payer: Medicare HMO | Admitting: Certified Registered Nurse Anesthetist

## 2020-12-15 ENCOUNTER — Inpatient Hospital Stay (HOSPITAL_COMMUNITY)
Admission: RE | Admit: 2020-12-15 | Discharge: 2020-12-17 | DRG: 457 | Disposition: A | Discharge status: Home / Self Care | Payer: Medicare HMO | Attending: Neurosurgery | Admitting: Neurosurgery

## 2020-12-15 ENCOUNTER — Inpatient Hospital Stay (HOSPITAL_COMMUNITY): Payer: Medicare HMO

## 2020-12-15 ENCOUNTER — Encounter (HOSPITAL_COMMUNITY): Payer: Self-pay | Admitting: Neurosurgery

## 2020-12-15 ENCOUNTER — Inpatient Hospital Stay (HOSPITAL_COMMUNITY): Payer: Medicare HMO | Admitting: Physician Assistant

## 2020-12-15 ENCOUNTER — Other Ambulatory Visit: Payer: Self-pay

## 2020-12-15 ENCOUNTER — Inpatient Hospital Stay (HOSPITAL_COMMUNITY)
Admission: RE | Disposition: A | Discharge status: Home / Self Care | Payer: Self-pay | Source: Home / Self Care | Attending: Neurosurgery

## 2020-12-15 DIAGNOSIS — G8929 Other chronic pain: Secondary | ICD-10-CM | POA: Diagnosis present

## 2020-12-15 DIAGNOSIS — M797 Fibromyalgia: Secondary | ICD-10-CM | POA: Diagnosis present

## 2020-12-15 DIAGNOSIS — H04123 Dry eye syndrome of bilateral lacrimal glands: Secondary | ICD-10-CM | POA: Diagnosis present

## 2020-12-15 DIAGNOSIS — Z981 Arthrodesis status: Secondary | ICD-10-CM

## 2020-12-15 DIAGNOSIS — I10 Essential (primary) hypertension: Secondary | ICD-10-CM | POA: Diagnosis present

## 2020-12-15 DIAGNOSIS — K219 Gastro-esophageal reflux disease without esophagitis: Secondary | ICD-10-CM | POA: Diagnosis present

## 2020-12-15 DIAGNOSIS — Z888 Allergy status to other drugs, medicaments and biological substances status: Secondary | ICD-10-CM | POA: Diagnosis not present

## 2020-12-15 DIAGNOSIS — M1990 Unspecified osteoarthritis, unspecified site: Secondary | ICD-10-CM | POA: Diagnosis present

## 2020-12-15 DIAGNOSIS — Z880 Allergy status to penicillin: Secondary | ICD-10-CM | POA: Diagnosis not present

## 2020-12-15 DIAGNOSIS — Y838 Other surgical procedures as the cause of abnormal reaction of the patient, or of later complication, without mention of misadventure at the time of the procedure: Secondary | ICD-10-CM | POA: Diagnosis present

## 2020-12-15 DIAGNOSIS — F32A Depression, unspecified: Secondary | ICD-10-CM | POA: Diagnosis present

## 2020-12-15 DIAGNOSIS — G43909 Migraine, unspecified, not intractable, without status migrainosus: Secondary | ICD-10-CM | POA: Diagnosis present

## 2020-12-15 DIAGNOSIS — M40204 Unspecified kyphosis, thoracic region: Secondary | ICD-10-CM | POA: Diagnosis present

## 2020-12-15 DIAGNOSIS — Z20822 Contact with and (suspected) exposure to covid-19: Secondary | ICD-10-CM | POA: Diagnosis present

## 2020-12-15 DIAGNOSIS — M5414 Radiculopathy, thoracic region: Secondary | ICD-10-CM | POA: Diagnosis present

## 2020-12-15 DIAGNOSIS — Z79899 Other long term (current) drug therapy: Secondary | ICD-10-CM | POA: Diagnosis not present

## 2020-12-15 DIAGNOSIS — Z881 Allergy status to other antibiotic agents status: Secondary | ICD-10-CM | POA: Diagnosis not present

## 2020-12-15 DIAGNOSIS — Z79631 Long term (current) use of antimetabolite agent: Secondary | ICD-10-CM | POA: Diagnosis not present

## 2020-12-15 DIAGNOSIS — M960 Pseudarthrosis after fusion or arthrodesis: Secondary | ICD-10-CM | POA: Diagnosis present

## 2020-12-15 DIAGNOSIS — Z791 Long term (current) use of non-steroidal anti-inflammatories (NSAID): Secondary | ICD-10-CM

## 2020-12-15 DIAGNOSIS — F419 Anxiety disorder, unspecified: Secondary | ICD-10-CM | POA: Diagnosis present

## 2020-12-15 DIAGNOSIS — I959 Hypotension, unspecified: Secondary | ICD-10-CM | POA: Diagnosis not present

## 2020-12-15 DIAGNOSIS — M40294 Other kyphosis, thoracic region: Secondary | ICD-10-CM | POA: Diagnosis present

## 2020-12-15 DIAGNOSIS — Z419 Encounter for procedure for purposes other than remedying health state, unspecified: Secondary | ICD-10-CM

## 2020-12-15 SURGERY — POSTERIOR LUMBAR FUSION 1 LEVEL
Anesthesia: General | Site: Back

## 2020-12-15 MED ORDER — FENTANYL CITRATE (PF) 250 MCG/5ML IJ SOLN
INTRAMUSCULAR | Status: AC
  Filled 2020-12-15: qty 5

## 2020-12-15 MED ORDER — HYDROCHLOROTHIAZIDE 25 MG PO TABS
25.0000 mg | ORAL_TABLET | Freq: Every day | ORAL | Status: DC
  Administered 2020-12-15 – 2020-12-17 (×3): 25 mg via ORAL
  Filled 2020-12-15 (×3): qty 1

## 2020-12-15 MED ORDER — SODIUM CHLORIDE 0.9% FLUSH
3.0000 mL | Freq: Two times a day (BID) | INTRAVENOUS | Status: DC
  Administered 2020-12-15 – 2020-12-16 (×3): 3 mL via INTRAVENOUS

## 2020-12-15 MED ORDER — BUPIVACAINE-EPINEPHRINE (PF) 0.5% -1:200000 IJ SOLN
INTRAMUSCULAR | Status: DC | PRN
  Administered 2020-12-15: 10 mL

## 2020-12-15 MED ORDER — PROMETHAZINE HCL 25 MG PO TABS: 25.0000 mg | ORAL_TABLET | Freq: Three times a day (TID) | ORAL | Status: DC | PRN

## 2020-12-15 MED ORDER — ACETAMINOPHEN 650 MG RE SUPP: 650.0000 mg | RECTAL | Status: DC | PRN

## 2020-12-15 MED ORDER — BISACODYL 10 MG RE SUPP: 10.0000 mg | Freq: Every day | RECTAL | Status: DC | PRN

## 2020-12-15 MED ORDER — EPHEDRINE SULFATE-NACL 50-0.9 MG/10ML-% IV SOSY
PREFILLED_SYRINGE | INTRAVENOUS | Status: DC | PRN
  Administered 2020-12-15 (×5): 5 mg via INTRAVENOUS

## 2020-12-15 MED ORDER — MIDAZOLAM HCL 2 MG/2ML IJ SOLN
INTRAMUSCULAR | Status: DC | PRN
  Administered 2020-12-15: 1 mg via INTRAVENOUS

## 2020-12-15 MED ORDER — THROMBIN 5000 UNITS EX SOLR
CUTANEOUS | Status: AC
  Filled 2020-12-15: qty 5000

## 2020-12-15 MED ORDER — THROMBIN 5000 UNITS EX SOLR
OROMUCOSAL | Status: DC | PRN
  Administered 2020-12-15 (×2): 5 mL via TOPICAL

## 2020-12-15 MED ORDER — CHLORHEXIDINE GLUCONATE 0.12 % MT SOLN
15.0000 mL | Freq: Once | OROMUCOSAL | Status: AC
  Administered 2020-12-15: 15 mL via OROMUCOSAL
  Filled 2020-12-15: qty 15

## 2020-12-15 MED ORDER — PHENYLEPHRINE HCL-NACL 20-0.9 MG/250ML-% IV SOLN
INTRAVENOUS | Status: DC | PRN
  Administered 2020-12-15: 40 ug/min via INTRAVENOUS

## 2020-12-15 MED ORDER — POTASSIUM CHLORIDE CRYS ER 20 MEQ PO TBCR
10.0000 meq | EXTENDED_RELEASE_TABLET | Freq: Every day | ORAL | Status: DC
  Administered 2020-12-15 – 2020-12-17 (×3): 10 meq via ORAL
  Filled 2020-12-15 (×3): qty 1

## 2020-12-15 MED ORDER — VANCOMYCIN HCL IN DEXTROSE 1-5 GM/200ML-% IV SOLN
INTRAVENOUS | Status: AC
  Filled 2020-12-15: qty 200

## 2020-12-15 MED ORDER — PROPOFOL 10 MG/ML IV BOLUS
INTRAVENOUS | Status: AC
  Filled 2020-12-15: qty 20

## 2020-12-15 MED ORDER — ACETAMINOPHEN 500 MG PO TABS
1000.0000 mg | ORAL_TABLET | Freq: Once | ORAL | Status: AC
  Administered 2020-12-15: 1000 mg via ORAL
  Filled 2020-12-15: qty 2

## 2020-12-15 MED ORDER — ESCITALOPRAM OXALATE 20 MG PO TABS
20.0000 mg | ORAL_TABLET | Freq: Every day | ORAL | Status: DC
  Administered 2020-12-16 – 2020-12-17 (×2): 20 mg via ORAL
  Filled 2020-12-15 (×2): qty 1

## 2020-12-15 MED ORDER — BACITRACIN ZINC 500 UNIT/GM EX OINT
TOPICAL_OINTMENT | CUTANEOUS | Status: AC
  Filled 2020-12-15: qty 28.35

## 2020-12-15 MED ORDER — DILTIAZEM HCL ER COATED BEADS 120 MG PO CP24
120.0000 mg | ORAL_CAPSULE | Freq: Every day | ORAL | Status: DC
  Filled 2020-12-15: qty 1

## 2020-12-15 MED ORDER — CEFAZOLIN SODIUM-DEXTROSE 2-4 GM/100ML-% IV SOLN
INTRAVENOUS | Status: AC
  Filled 2020-12-15: qty 100

## 2020-12-15 MED ORDER — PROPOFOL 10 MG/ML IV BOLUS
INTRAVENOUS | Status: DC | PRN
  Administered 2020-12-15: 80 mg via INTRAVENOUS

## 2020-12-15 MED ORDER — LACTATED RINGERS IV SOLN: INTRAVENOUS | Status: DC

## 2020-12-15 MED ORDER — LIDOCAINE 2% (20 MG/ML) 5 ML SYRINGE
INTRAMUSCULAR | Status: AC
  Filled 2020-12-15: qty 5

## 2020-12-15 MED ORDER — OXYBUTYNIN CHLORIDE ER 5 MG PO TB24
5.0000 mg | ORAL_TABLET | Freq: Every day | ORAL | Status: DC
  Administered 2020-12-15 – 2020-12-16 (×2): 5 mg via ORAL
  Filled 2020-12-15 (×3): qty 1

## 2020-12-15 MED ORDER — APREPITANT 40 MG PO CAPS
40.0000 mg | ORAL_CAPSULE | Freq: Once | ORAL | Status: AC
  Administered 2020-12-15: 40 mg via ORAL
  Filled 2020-12-15: qty 1

## 2020-12-15 MED ORDER — CEFAZOLIN SODIUM-DEXTROSE 2-4 GM/100ML-% IV SOLN
2.0000 g | Freq: Three times a day (TID) | INTRAVENOUS | Status: AC
  Administered 2020-12-15 (×2): 2 g via INTRAVENOUS
  Filled 2020-12-15 (×2): qty 100

## 2020-12-15 MED ORDER — SODIUM CHLORIDE 0.9% FLUSH: 3.0000 mL | INTRAVENOUS | Status: DC | PRN

## 2020-12-15 MED ORDER — DIPHENHYDRAMINE HCL 50 MG/ML IJ SOLN
INTRAMUSCULAR | Status: AC
  Filled 2020-12-15: qty 1

## 2020-12-15 MED ORDER — ONDANSETRON HCL 4 MG/2ML IJ SOLN
INTRAMUSCULAR | Status: DC | PRN
  Administered 2020-12-15: 4 mg via INTRAVENOUS

## 2020-12-15 MED ORDER — ACETAMINOPHEN 325 MG PO TABS
650.0000 mg | ORAL_TABLET | ORAL | Status: DC | PRN
  Administered 2020-12-16 – 2020-12-17 (×3): 650 mg via ORAL
  Filled 2020-12-15 (×3): qty 2

## 2020-12-15 MED ORDER — 0.9 % SODIUM CHLORIDE (POUR BTL) OPTIME
TOPICAL | Status: DC | PRN
  Administered 2020-12-15: 1000 mL

## 2020-12-15 MED ORDER — DIPHENHYDRAMINE HCL 50 MG/ML IJ SOLN
INTRAMUSCULAR | Status: DC | PRN
  Administered 2020-12-15: 12.5 mg via INTRAVENOUS

## 2020-12-15 MED ORDER — BACITRACIN ZINC 500 UNIT/GM EX OINT
TOPICAL_OINTMENT | CUTANEOUS | Status: DC | PRN
  Administered 2020-12-15: 1 via TOPICAL

## 2020-12-15 MED ORDER — VANCOMYCIN HCL IN DEXTROSE 1-5 GM/200ML-% IV SOLN: 1000.0000 mg | INTRAVENOUS | Status: DC

## 2020-12-15 MED ORDER — PROPOFOL 1000 MG/100ML IV EMUL
INTRAVENOUS | Status: AC
  Filled 2020-12-15: qty 100

## 2020-12-15 MED ORDER — ACETAMINOPHEN 500 MG PO TABS
1000.0000 mg | ORAL_TABLET | Freq: Four times a day (QID) | ORAL | Status: AC
  Administered 2020-12-16: 1000 mg via ORAL
  Filled 2020-12-15: qty 2

## 2020-12-15 MED ORDER — FENTANYL CITRATE (PF) 100 MCG/2ML IJ SOLN
INTRAMUSCULAR | Status: AC
  Filled 2020-12-15: qty 2

## 2020-12-15 MED ORDER — FENTANYL CITRATE (PF) 100 MCG/2ML IJ SOLN
25.0000 ug | INTRAMUSCULAR | Status: DC | PRN
  Administered 2020-12-15: 25 ug via INTRAVENOUS

## 2020-12-15 MED ORDER — ZOLPIDEM TARTRATE 5 MG PO TABS: 5.0000 mg | ORAL_TABLET | Freq: Every evening | ORAL | Status: DC | PRN

## 2020-12-15 MED ORDER — CALCIUM CARBONATE 1250 (500 CA) MG PO TABS
1.0000 | ORAL_TABLET | Freq: Every day | ORAL | Status: DC
  Administered 2020-12-16 – 2020-12-17 (×2): 500 mg via ORAL
  Filled 2020-12-15 (×3): qty 1

## 2020-12-15 MED ORDER — TIZANIDINE HCL 4 MG PO TABS
2.0000 mg | ORAL_TABLET | Freq: Four times a day (QID) | ORAL | Status: DC | PRN
  Administered 2020-12-15 – 2020-12-16 (×2): 2 mg via ORAL
  Filled 2020-12-15 (×2): qty 1

## 2020-12-15 MED ORDER — ATORVASTATIN CALCIUM 10 MG PO TABS
20.0000 mg | ORAL_TABLET | ORAL | Status: DC
  Administered 2020-12-17: 20 mg via ORAL
  Filled 2020-12-15: qty 2

## 2020-12-15 MED ORDER — OXYCODONE HCL 5 MG PO TABS
5.0000 mg | ORAL_TABLET | ORAL | Status: DC | PRN
  Administered 2020-12-17: 5 mg via ORAL
  Filled 2020-12-15 (×2): qty 1

## 2020-12-15 MED ORDER — EPHEDRINE 5 MG/ML INJ
INTRAVENOUS | Status: AC
  Filled 2020-12-15: qty 5

## 2020-12-15 MED ORDER — ONDANSETRON HCL 4 MG/2ML IJ SOLN
INTRAMUSCULAR | Status: AC
  Filled 2020-12-15: qty 2

## 2020-12-15 MED ORDER — CHLORHEXIDINE GLUCONATE CLOTH 2 % EX PADS: 6.0000 | MEDICATED_PAD | Freq: Once | CUTANEOUS | Status: DC

## 2020-12-15 MED ORDER — MIDAZOLAM HCL 2 MG/2ML IJ SOLN
INTRAMUSCULAR | Status: AC
  Filled 2020-12-15: qty 2

## 2020-12-15 MED ORDER — BUPIVACAINE LIPOSOME 1.3 % IJ SUSP
INTRAMUSCULAR | Status: AC
  Filled 2020-12-15: qty 20

## 2020-12-15 MED ORDER — LIDOCAINE 2% (20 MG/ML) 5 ML SYRINGE
INTRAMUSCULAR | Status: DC | PRN
  Administered 2020-12-15: 20 mg via INTRAVENOUS

## 2020-12-15 MED ORDER — BUPIVACAINE-EPINEPHRINE 0.5% -1:200000 IJ SOLN
INTRAMUSCULAR | Status: AC
  Filled 2020-12-15: qty 1

## 2020-12-15 MED ORDER — VITAMIN D 25 MCG (1000 UNIT) PO TABS
2000.0000 [IU] | ORAL_TABLET | Freq: Every day | ORAL | Status: DC
  Administered 2020-12-16 – 2020-12-17 (×2): 2000 [IU] via ORAL
  Filled 2020-12-15 (×2): qty 2

## 2020-12-15 MED ORDER — ROCURONIUM BROMIDE 10 MG/ML (PF) SYRINGE
PREFILLED_SYRINGE | INTRAVENOUS | Status: AC
  Filled 2020-12-15: qty 10

## 2020-12-15 MED ORDER — OXYCODONE HCL 5 MG PO TABS: 10.0000 mg | ORAL_TABLET | ORAL | Status: DC | PRN

## 2020-12-15 MED ORDER — ONDANSETRON HCL 4 MG/2ML IJ SOLN: 4.0000 mg | Freq: Four times a day (QID) | INTRAMUSCULAR | Status: DC | PRN

## 2020-12-15 MED ORDER — PHENYLEPHRINE 40 MCG/ML (10ML) SYRINGE FOR IV PUSH (FOR BLOOD PRESSURE SUPPORT)
PREFILLED_SYRINGE | INTRAVENOUS | Status: AC
  Filled 2020-12-15: qty 10

## 2020-12-15 MED ORDER — BUPIVACAINE LIPOSOME 1.3 % IJ SUSP
INTRAMUSCULAR | Status: DC | PRN
  Administered 2020-12-15: 20 mL

## 2020-12-15 MED ORDER — SODIUM CHLORIDE 0.9 % IV SOLN
250.0000 mL | INTRAVENOUS | Status: DC
  Administered 2020-12-15: 250 mL via INTRAVENOUS

## 2020-12-15 MED ORDER — NEOSTIGMINE METHYLSULFATE 10 MG/10ML IV SOLN
INTRAVENOUS | Status: DC | PRN
  Administered 2020-12-15: 2 mg via INTRAVENOUS

## 2020-12-15 MED ORDER — DOCUSATE SODIUM 100 MG PO CAPS
100.0000 mg | ORAL_CAPSULE | Freq: Two times a day (BID) | ORAL | Status: DC
  Administered 2020-12-15 – 2020-12-17 (×5): 100 mg via ORAL
  Filled 2020-12-15 (×5): qty 1

## 2020-12-15 MED ORDER — DOCUSATE SODIUM 100 MG PO CAPS: 100.0000 mg | ORAL_CAPSULE | Freq: Two times a day (BID) | ORAL | Status: DC

## 2020-12-15 MED ORDER — ONDANSETRON HCL 4 MG/2ML IJ SOLN: 4.0000 mg | Freq: Once | INTRAMUSCULAR | Status: DC | PRN

## 2020-12-15 MED ORDER — CEFAZOLIN SODIUM-DEXTROSE 2-3 GM-%(50ML) IV SOLR
INTRAVENOUS | Status: DC | PRN
  Administered 2020-12-15: 2 g via INTRAVENOUS

## 2020-12-15 MED ORDER — MENTHOL 3 MG MT LOZG: 1.0000 | LOZENGE | OROMUCOSAL | Status: DC | PRN

## 2020-12-15 MED ORDER — OXYCODONE HCL 5 MG/5ML PO SOLN: 5.0000 mg | Freq: Once | ORAL | Status: AC | PRN

## 2020-12-15 MED ORDER — OXYCODONE HCL 5 MG PO TABS
ORAL_TABLET | ORAL | Status: AC
  Filled 2020-12-15: qty 1

## 2020-12-15 MED ORDER — OXYCODONE-ACETAMINOPHEN 5-325 MG PO TABS
1.0000 | ORAL_TABLET | ORAL | Status: DC | PRN
Start: 2020-12-15 — End: 2020-12-17
  Administered 2020-12-15 – 2020-12-16 (×4): 2 via ORAL
  Administered 2020-12-17: 1 via ORAL
  Filled 2020-12-15: qty 2
  Filled 2020-12-15: qty 1
  Filled 2020-12-15 (×3): qty 2

## 2020-12-15 MED ORDER — GLYCOPYRROLATE 0.2 MG/ML IJ SOLN
INTRAMUSCULAR | Status: DC | PRN
  Administered 2020-12-15: .3 mg via INTRAVENOUS

## 2020-12-15 MED ORDER — LORAZEPAM 0.5 MG PO TABS
0.5000 mg | ORAL_TABLET | Freq: Two times a day (BID) | ORAL | Status: DC
  Administered 2020-12-16 – 2020-12-17 (×3): 0.5 mg via ORAL
  Filled 2020-12-15 (×3): qty 1

## 2020-12-15 MED ORDER — ORAL CARE MOUTH RINSE: 15.0000 mL | Freq: Once | OROMUCOSAL | Status: AC

## 2020-12-15 MED ORDER — ONDANSETRON HCL 4 MG PO TABS: 4.0000 mg | ORAL_TABLET | Freq: Four times a day (QID) | ORAL | Status: DC | PRN

## 2020-12-15 MED ORDER — PHENYLEPHRINE 40 MCG/ML (10ML) SYRINGE FOR IV PUSH (FOR BLOOD PRESSURE SUPPORT)
PREFILLED_SYRINGE | INTRAVENOUS | Status: DC | PRN
  Administered 2020-12-15 (×3): 80 ug via INTRAVENOUS

## 2020-12-15 MED ORDER — PROPOFOL 500 MG/50ML IV EMUL
INTRAVENOUS | Status: DC | PRN
  Administered 2020-12-15: 50 ug/kg/min via INTRAVENOUS

## 2020-12-15 MED ORDER — POLYVINYL ALCOHOL 1.4 % OP SOLN: 1.0000 [drp] | Freq: Three times a day (TID) | OPHTHALMIC | Status: DC | PRN

## 2020-12-15 MED ORDER — PHENOL 1.4 % MT LIQD: 1.0000 | OROMUCOSAL | Status: DC | PRN

## 2020-12-15 MED ORDER — KETAMINE HCL 10 MG/ML IJ SOLN
INTRAMUSCULAR | Status: DC | PRN
  Administered 2020-12-15 (×3): 10 mg via INTRAVENOUS

## 2020-12-15 MED ORDER — MORPHINE SULFATE (PF) 4 MG/ML IV SOLN: 4.0000 mg | INTRAVENOUS | Status: DC | PRN

## 2020-12-15 MED ORDER — GABAPENTIN 100 MG PO CAPS
100.0000 mg | ORAL_CAPSULE | Freq: Three times a day (TID) | ORAL | Status: DC
  Administered 2020-12-15 – 2020-12-17 (×6): 100 mg via ORAL
  Filled 2020-12-15 (×6): qty 1

## 2020-12-15 MED ORDER — OXYCODONE HCL 5 MG PO TABS
5.0000 mg | ORAL_TABLET | Freq: Once | ORAL | Status: AC | PRN
  Administered 2020-12-15: 5 mg via ORAL

## 2020-12-15 MED ORDER — ROCURONIUM BROMIDE 10 MG/ML (PF) SYRINGE
PREFILLED_SYRINGE | INTRAVENOUS | Status: DC | PRN
  Administered 2020-12-15: 40 mg via INTRAVENOUS

## 2020-12-15 MED ORDER — FENTANYL CITRATE (PF) 250 MCG/5ML IJ SOLN
INTRAMUSCULAR | Status: DC | PRN
  Administered 2020-12-15: 25 ug via INTRAVENOUS
  Administered 2020-12-15: 50 ug via INTRAVENOUS
  Administered 2020-12-15: 25 ug via INTRAVENOUS

## 2020-12-15 MED ORDER — MAGNESIUM GLUCONATE 500 MG PO TABS
250.0000 mg | ORAL_TABLET | Freq: Every day | ORAL | Status: DC
  Administered 2020-12-16 – 2020-12-17 (×2): 250 mg via ORAL
  Filled 2020-12-15 (×2): qty 1

## 2020-12-15 SURGICAL SUPPLY — 73 items
BAG COUNTER SPONGE SURGICOUNT (BAG) ×2 IMPLANT
BASKET BONE COLLECTION (BASKET) ×2 IMPLANT
BENZOIN TINCTURE PRP APPL 2/3 (GAUZE/BANDAGES/DRESSINGS) ×2 IMPLANT
BLADE CLIPPER SURG (BLADE) IMPLANT
BUR MATCHSTICK NEURO 3.0 LAGG (BURR) ×2 IMPLANT
BUR PRECISION FLUTE 6.0 (BURR) ×2 IMPLANT
CANISTER SUCT 3000ML PPV (MISCELLANEOUS) ×2 IMPLANT
CAP LOCK DLX THRD (Cap) ×12 IMPLANT
CARTRIDGE OIL MAESTRO DRILL (MISCELLANEOUS) ×1 IMPLANT
CLAMP SINGLE ROD TO ROD (Clamp) ×4 IMPLANT
CNTNR URN SCR LID CUP LEK RST (MISCELLANEOUS) ×2 IMPLANT
CONT SPEC 4OZ STRL OR WHT (MISCELLANEOUS) ×2
COVER BACK TABLE 60X90IN (DRAPES) ×2 IMPLANT
DECANTER SPIKE VIAL GLASS SM (MISCELLANEOUS) ×2 IMPLANT
DIFFUSER DRILL AIR PNEUMATIC (MISCELLANEOUS) ×2 IMPLANT
DRAPE C-ARM 42X72 X-RAY (DRAPES) ×4 IMPLANT
DRAPE HALF SHEET 40X57 (DRAPES) ×2 IMPLANT
DRAPE LAPAROTOMY 100X72X124 (DRAPES) ×2 IMPLANT
DRAPE SURG 17X23 STRL (DRAPES) ×8 IMPLANT
DRSG OPSITE POSTOP 4X10 (GAUZE/BANDAGES/DRESSINGS) ×2 IMPLANT
DRSG OPSITE POSTOP 4X6 (GAUZE/BANDAGES/DRESSINGS) ×2 IMPLANT
ELECT BLADE 4.0 EZ CLEAN MEGAD (MISCELLANEOUS) ×2
ELECT REM PT RETURN 9FT ADLT (ELECTROSURGICAL) ×2
ELECTRODE BLDE 4.0 EZ CLN MEGD (MISCELLANEOUS) ×1 IMPLANT
ELECTRODE REM PT RTRN 9FT ADLT (ELECTROSURGICAL) ×1 IMPLANT
EVACUATOR 1/8 PVC DRAIN (DRAIN) IMPLANT
GAUZE 4X4 16PLY ~~LOC~~+RFID DBL (SPONGE) ×2 IMPLANT
GLOVE EXAM NITRILE XL STR (GLOVE) IMPLANT
GLOVE SURG ENC MOIS LTX SZ6 (GLOVE) ×2 IMPLANT
GLOVE SURG ENC MOIS LTX SZ6.5 (GLOVE) ×8 IMPLANT
GLOVE SURG ENC MOIS LTX SZ8 (GLOVE) ×4 IMPLANT
GLOVE SURG ENC MOIS LTX SZ8.5 (GLOVE) ×4 IMPLANT
GLOVE SURG LTX SZ6.5 (GLOVE) ×2 IMPLANT
GLOVE SURG LTX SZ7 (GLOVE) ×4 IMPLANT
GOWN STRL REUS W/ TWL LRG LVL3 (GOWN DISPOSABLE) IMPLANT
GOWN STRL REUS W/ TWL XL LVL3 (GOWN DISPOSABLE) ×3 IMPLANT
GOWN STRL REUS W/TWL 2XL LVL3 (GOWN DISPOSABLE) IMPLANT
GOWN STRL REUS W/TWL LRG LVL3 (GOWN DISPOSABLE)
GOWN STRL REUS W/TWL XL LVL3 (GOWN DISPOSABLE) ×3
GRAFT TRIN ELITE MED MUSC TRAN (Graft) ×2 IMPLANT
HEMOSTAT POWDER KIT SURGIFOAM (HEMOSTASIS) ×2 IMPLANT
KIT BASIN OR (CUSTOM PROCEDURE TRAY) ×2 IMPLANT
KIT GRAFTMAG DEL NEURO DISP (NEUROSURGERY SUPPLIES) IMPLANT
KIT POSITION SURG JACKSON T1 (MISCELLANEOUS) ×2 IMPLANT
KIT TURNOVER KIT B (KITS) ×2 IMPLANT
MARKER SKIN DUAL TIP RULER LAB (MISCELLANEOUS) ×4 IMPLANT
MILL MEDIUM DISP (BLADE) ×2 IMPLANT
NEEDLE HYPO 21X1.5 SAFETY (NEEDLE) ×2 IMPLANT
NEEDLE HYPO 22GX1.5 SAFETY (NEEDLE) ×2 IMPLANT
NS IRRIG 1000ML POUR BTL (IV SOLUTION) ×2 IMPLANT
OIL CARTRIDGE MAESTRO DRILL (MISCELLANEOUS) ×2
PACK LAMINECTOMY NEURO (CUSTOM PROCEDURE TRAY) ×2 IMPLANT
PAD ARMBOARD 7.5X6 YLW CONV (MISCELLANEOUS) ×6 IMPLANT
PATTIES SURGICAL .5 X1 (DISPOSABLE) IMPLANT
PATTIES SURGICAL 1X1 (DISPOSABLE) ×2 IMPLANT
PUTTY DBM 10CC CALC GRAN (Putty) ×2 IMPLANT
RASP 3.0MM (RASP) ×2 IMPLANT
ROD Z OFFSET TI 6.35X12X100 (Rod) ×4 IMPLANT
SCREW PA DLX CREO 5.0X40 (Screw) ×4 IMPLANT
SCREW PA DLX CREO 5.5X40 (Screw) ×8 IMPLANT
SPONGE NEURO XRAY DETECT 1X3 (DISPOSABLE) IMPLANT
SPONGE SURGIFOAM ABS GEL 100 (HEMOSTASIS) IMPLANT
SPONGE T-LAP 4X18 ~~LOC~~+RFID (SPONGE) ×2 IMPLANT
STRIP CLOSURE SKIN 1/2X4 (GAUZE/BANDAGES/DRESSINGS) ×2 IMPLANT
SUT VIC AB 1 CT1 18XBRD ANBCTR (SUTURE) ×4 IMPLANT
SUT VIC AB 1 CT1 8-18 (SUTURE) ×4
SUT VIC AB 2-0 CP2 18 (SUTURE) ×4 IMPLANT
SUT VIC AB 3-0 SH 8-18 (SUTURE) ×8 IMPLANT
SYR 20ML LL LF (SYRINGE) ×2 IMPLANT
TOWEL GREEN STERILE (TOWEL DISPOSABLE) ×2 IMPLANT
TOWEL GREEN STERILE FF (TOWEL DISPOSABLE) ×2 IMPLANT
TRAY FOLEY MTR SLVR 16FR STAT (SET/KITS/TRAYS/PACK) ×2 IMPLANT
WATER STERILE IRR 1000ML POUR (IV SOLUTION) ×2 IMPLANT

## 2020-12-15 NOTE — H&P (Signed)
Subjective: The patient is a 75 year old white female on whom I perform multiple previous lumbar surgeries/fusions.  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  I discussed the various treatment options.  She has decided proceed with surgery.  Past Medical History:  Diagnosis Date   Allergy    Anxiety    Arthritis    Cough    due allergies chronic   Depression    Dilatation of esophagus    Fibromyalgia    GERD (gastroesophageal reflux disease)    Headache(784.0)    migraines   Heart murmur    one time   Hypertension    PONV (postoperative nausea and vomiting)    hx of Vertigo after anesthesia    Past Surgical History:  Procedure Laterality Date   ABDOMINAL HYSTERECTOMY     BACK SURGERY  12/21/2012   CHOLECYSTECTOMY     COLONOSCOPY     DIAGNOSTIC LAPAROSCOPY     lap chole   EYE SURGERY Bilateral    lasik   TUBAL LIGATION      Allergies  Allergen Reactions   Lisinopril Cough    Deep cough   Cyclobenzaprine     Effects bladder    Vancomycin Itching   Amoxicillin Rash    Has patient had a PCN reaction causing immediate rash, facial/tongue/throat swelling, SOB or lightheadedness with hypotension:unsure Has patient had a PCN reaction causing severe rash involving mucus membranes or skin necrosis:unsure Has patient had a PCN reaction that required hospitalization:NO  . Has patient had a PCN reaction occurring within the last 10 years:    Yes If all of the above answers are "NO", then may proceed with Cephalosporin use.    Cephalosporins Other (See Comments)    Flushing of the face    Social History   Tobacco Use   Smoking status: Never   Smokeless tobacco: Never  Substance Use Topics   Alcohol use: No    Comment: social    History reviewed. No pertinent family history. Prior to Admission medications   Medication Sig Start Date End Date Taking? Authorizing Provider  Artificial Tear Solution (SOOTHE XP OP) Place 1 drop into  both eyes 3 (three) times daily as needed (dry eyes).   Yes [provider]  atorvastatin (LIPITOR) 20 MG tablet Take 20 mg by mouth every Wednesday.    Yes [provider]  CALCIUM PO Take 1 tablet by mouth daily.   Yes [provider]  Cholecalciferol (VITAMIN D) 2000 UNITS CAPS Take 2,000 Units by mouth daily.   Yes [provider]  COD LIVER OIL PO Take 5 mLs by mouth daily.   Yes [provider]  cyanocobalamin 1000 MCG tablet Take 1,000 mcg by mouth daily.   Yes [provider]  diclofenac Sodium (VOLTAREN) 1 % GEL Apply 2 g topically in the morning.   Yes [provider]  diltiazem (CARDIZEM CD) 120 MG 24 hr capsule Take 120 mg by mouth daily. 04/22/20  Yes [provider]  docusate sodium (COLACE) 100 MG capsule Take 100 mg by mouth 2 (two) times daily.   Yes [provider]  escitalopram (LEXAPRO) 10 MG tablet Take 2 tablets (20 mg total) by mouth daily. 10/22/20  Yes Plovsky, Earvin Hansen, MD  folic acid (FOLVITE) 1 MG tablet Take 1 mg by mouth daily.   Yes [provider]  gabapentin (NEURONTIN) 100 MG capsule Take 100 mg by mouth 3 (three) times daily.   Yes [provider]  hydrochlorothiazide (HYDRODIURIL) 25 MG tablet Take 25 mg by mouth daily. 06/07/20  Yes [provider]  LORazepam (ATIVAN) 0.5 MG tablet 1 bid Patient taking differently: Take 0.5 mg by mouth 2 (two) times daily. 10/22/20  Yes Plovsky, Earvin Hansen, MD  Magnesium 250 MG TABS Take 250 mg by mouth daily.   Yes [provider]  meloxicam (MOBIC) 7.5 MG tablet Take 15 mg by mouth daily. 09/26/19  Yes [provider]  methotrexate (RHEUMATREX) 2.5 MG tablet Take 15 mg by mouth every Wednesday.   Yes [provider]  oxybutynin (DITROPAN-XL) 5 MG 24 hr tablet Take 5 mg by mouth at bedtime. 12/03/20  Yes [provider]  oxyCODONE-acetaminophen (PERCOCET/ROXICET) 5-325 MG tablet Take 1-2  tablets by mouth every 4 (four) hours as needed for severe pain. Patient taking differently: Take 1 tablet by mouth 2 (two) times daily as needed for severe pain. 07/11/20  Yes Val Eagle D, NP  potassium chloride (KLOR-CON) 10 MEQ tablet Take 10 mEq by mouth daily.   Yes [provider]  promethazine (PHENERGAN) 25 MG tablet Take 25 mg by mouth every 8 (eight) hours as needed for nausea or vomiting.   Yes [provider]  camphor-menthol Wynelle Fanny) lotion Apply 1 application topically as needed (rash).    [provider]  cyclobenzaprine (FLEXERIL) 10 MG tablet Take 1 tablet (10 mg total) by mouth 3 (three) times daily as needed for muscle spasms. Patient not taking: Reported on 12/03/2020 07/11/20   Val Eagle D, NP     Review of Systems  Positive ROS: As above  All other systems have been reviewed and were otherwise negative with the exception of those mentioned in the HPI and as above.  Objective: Vital signs in last 24 hours: Temp:  [98.6 F (37 C)] 98.6 F (37 C) (12/05 0557) Pulse Rate:  [50] 50 (12/05 0557) Resp:  [18] 18 (12/05 0557) BP: (142)/(49) 142/49 (12/05 0557) SpO2:  [100 %] 100 % (12/05 0557) Weight:  [42.6 kg] 42.6 kg (12/05 0557) Estimated body mass index is 18.36 kg/m as calculated from the following:   Height as of this encounter: 5' (1.524 m).   Weight as of this encounter: 42.6 kg.   General Appearance: Alert Head: Normocephalic, without obvious abnormality, atraumatic Eyes: PERRL, conjunctiva/corneas clear, EOM's intact,    Ears: Normal  Throat: Normal  Neck: Supple, Back: The patient's thoracic lumbar incision is well-healed.   Lungs: Clear to auscultation bilaterally, respirations unlabored Heart: Regular rate and rhythm, no murmur, rub or gallop Abdomen: Soft, non-tender Extremities: Extremities normal, atraumatic, no cyanosis or edema Skin: unremarkable  NEUROLOGIC:   Mental status: alert and oriented,Motor Exam  - grossly normal Sensory Exam - grossly normal Reflexes:  Coordination - grossly normal Gait - grossly normal Balance - grossly normal Cranial Nerves: I: smell Not tested  II: visual acuity  OS: Normal  OD: Normal   II: visual fields Full to confrontation  II: pupils Equal, round, reactive to light  III,VII: ptosis None  III,IV,VI: extraocular muscles  Full ROM  V: mastication Normal  V: facial light touch sensation  Normal  V,VII: corneal reflex  Present  VII: facial muscle function - upper  Normal  VII: facial muscle function - lower Normal  VIII: hearing Not tested  IX: soft palate elevation  Normal  IX,X: gag reflex Present  XI: trapezius strength  5/5  XI: sternocleidomastoid strength 5/5  XI: neck flexion strength  5/5  XII: tongue strength  Normal    Data Review Lab Results  Component Value Date   WBC 6.5 12/11/2020   HGB 12.3 12/11/2020   HCT 36.7 12/11/2020   MCV 87.0 12/11/2020   PLT 208 12/11/2020   Lab Results  Component Value Date   NA 135 12/11/2020   K 3.0 (L) 12/11/2020   CL 93 (L) 12/11/2020   CO2 34 (H) 12/11/2020   BUN 13 12/11/2020   CREATININE 0.82 12/11/2020   GLUCOSE 122 (H) 12/11/2020   No results found for: INR, PROTIME  Assessment/Plan: Thoracic pseudoarthrosis, proximal junctional process, thoracic spine pain: I have discussed the situation with the patient.  I reviewed her imaging studies with her.  We have discussed the various treatment options including exploration of her fusion with extension of fusion to approximately T7.  I have described that surgery to her.  I have shown her surgical models.  I have given her a surgical pamphlet.  We have discussed the risk, benefits, alternatives, expected postop course, and likelihood of achieving our goals with surgery.  I have answered all her questions.  She has decided proceed with surgery.   [name] 12/15/2020 7:21 AM

## 2020-12-15 NOTE — Progress Notes (Signed)
Subjective: The patient is alert and pleasant.  Her boyfriend is at the bedside.  She is in no apparent distress.  Objective: Vital signs in last 24 hours: Temp:  [97.7 F (36.5 C)-98.6 F (37 C)] 97.9 F (36.6 C) (12/05 1448) Pulse Rate:  [50-73] 61 (12/05 1448) Resp:  [8-20] 20 (12/05 1448) BP: (117-142)/(37-49) 125/39 (12/05 1448) SpO2:  [93 %-100 %] 100 % (12/05 1448) Weight:  [42.6 kg] 42.6 kg (12/05 0557) Estimated body mass index is 18.36 kg/m as calculated from the following:   Height as of this encounter: 5' (1.524 m).   Weight as of this encounter: 42.6 kg.   Intake/Output from previous day: No intake/output data recorded. Intake/Output this shift: Total I/O In: 2220 [P.O.:120; I.V.:2000; Other:50; IV Piggyback:50] Out: 300 [Urine:100; Blood:200]  Physical exam the patient is alert and pleasant.  She is moving her lower extremities well.  Lab Results: No results for input(s): WBC, HGB, HCT, PLT in the last 72 hours. BMET No results for input(s): NA, K, CL, CO2, GLUCOSE, BUN, CREATININE, CALCIUM in the last 72 hours.  Studies/Results: DG Thoracic Spine 2 View  Result Date: 12/15/2020 CLINICAL DATA:  Thoracolumbar fusion EXAM: THORACIC SPINE 2 VIEWS COMPARISON:  11/15/2018 FINDINGS: C-arm images show extension of the thoracolumbar fusion with pedicle screws down place at the 3 levels above the prior fusion. IMPRESSION: Extension of the thoracolumbar fusion with placement of pedicle screws at the 3 levels above the prior upper margin of the fusion. Electronically Signed   By: Paulina Fusi M.D.   On: 12/15/2020 10:40   DG C-Arm 1-60 Min-No Report  Result Date: 12/15/2020 Fluoroscopy was utilized by the requesting physician.  No radiographic interpretation.    Assessment/Plan: The patient is doing well.  Her TLSO is in her boyfriend's car.  I have asked him to bring it to the unit.  LOS: 0 days     [name] 12/15/2020, 3:03 PM     Patient ID: Jeral Pinch, female   DOB: 1945-07-20, 75 y.o.   MRN: [id number]

## 2020-12-15 NOTE — Transfer of Care (Signed)
Immediate Anesthesia Transfer of Care Note  Patient: Carrie Austin  Procedure(s) Performed: EXPLORE THORACOLUMBAR FUSION, EXTEND FUSION INSTRUMENTATION THRACIC SEVEN To [place] (Back)  Patient Location: PACU  Anesthesia Type:General  Level of Consciousness: drowsy  Airway & Oxygen Therapy: Patient Spontanous Breathing and Patient connected to nasal cannula oxygen  Post-op Assessment: Report given to RN and Post -op Vital signs reviewed and stable  Post vital signs: Reviewed and stable  Last Vitals:  Vitals Value Taken Time  BP    Temp    Pulse    Resp    SpO2      Last Pain:  Vitals:   12/15/20 0557  TempSrc: Oral      Patients Stated Pain Goal: 0 (12/15/20 0102)  Complications: No notable events documented.

## 2020-12-15 NOTE — Anesthesia Postprocedure Evaluation (Signed)
Anesthesia Post Note  Patient: Carrie Austin  Procedure(s) Performed: EXPLORE THORACOLUMBAR FUSION, EXTEND FUSION INSTRUMENTATION THRACIC SEVEN To [place] (Back)     Patient location during evaluation: PACU Anesthesia Type: General Level of consciousness: awake and alert Pain management: pain level controlled Vital Signs Assessment: post-procedure vital signs reviewed and stable Respiratory status: spontaneous breathing, nonlabored ventilation and respiratory function stable Cardiovascular status: stable and blood pressure returned to baseline Anesthetic complications: no   No notable events documented.  Last Vitals:  Vitals:   12/15/20 1325 12/15/20 1340  BP: (!) 127/49 (!) 117/47  Pulse: 71 63  Resp: 11 11  Temp:    SpO2: 100% 100%    Last Pain:  Vitals:   12/15/20 1340  TempSrc:   PainSc: Asleep                 Beryle Lathe

## 2020-12-15 NOTE — Progress Notes (Signed)
Orthopedic Tech Progress Note Patient Details:  [name] 09-04-1945 [id number]  RN stated " patient has back brace"   Patient ID: Carrie Austin, female   DOB: Jan 14, 1945, 75 y.o.   MRN: [id number]  Donald Pore 12/15/2020, 3:41 PM

## 2020-12-15 NOTE — Op Note (Signed)
Brief history: The patient is a 75 year old white female on whom I previously performed several lumbar instrumentation and fusion.  The most recent of which extend her fusion to T10.  The patient initially did well but developed recurrent thoracic pain and radiculopathy.  She failed medical management and was worked up with a thoracic myelo CT which demonstrated findings consistent with a pseudoarthrosis and a proximal junctional kyphosis.  I discussed the various treatment options with her.  She has decided to proceed with surgery.  Preop diagnosis: Thoracic pseudoarthrosis, thoracic spine pain, thoracic proximal junctional kyphosis, thoracic radiculopathy  Postop diagnosis: The same  Procedure: Exploration of lumbar fusion/removal of lumbar hardware; bilateral T9-10 laminotomy/foraminotomy to decompress the bilateral T9 nerve roots; posterior lateral arthrodesis and T7-8, T8-9, T9-10 and T10-11 with local morselized autograft bone ,intra grow bone graft extender and osteocell; posterior segmental instrumentation T7 to the ilium with globus titanium screws and rods;  Surgeon: Dr. Delma Officer  Assistant Dr. Coletta Memos and Hildred Priest, NP  Anesthesia: General tracheal  Estimated blood loss: 200 cc  Specimens: None  Drains: None  Complications: None  Description of procedure: The patient was brought to the operating room by the anesthesia team.  General endotracheal anesthesia was induced.  She was turned to the prone position on the [place] table.  The patient's thoracolumbar region was then prepared with Betadine scrub and Betadine solution.  Sterile drapes were applied.  I then injected the area to be incised with Marcaine with epinephrine solution.  I then used a scalpel to incise through the patient's previous upper incision/scar and extended into the mid thoracic region.  I used electrocautery to perform a bilateral subperiosteal dissection exposing the spinous processes and lamina  from T9 to approximately T7 and to expose the old hardware.  We inserted the cerebellar retractors for exposure.  We began the exploration of her fusion by removing the caps the old screws at T10.  The screws appear quite loose.  I then cut the rod just caudal to the T10 screw bilaterally and then remove the T10 screw which was extremely loose indicative of a pseudoarthrosis at T10-11.  I then used a high-speed drill to perform bilateral T9-10 laminotomies.  I remove the ligamentum flavum at T9-10 bilaterally and perform a foraminotomy about the bilateral T9 nerve root with a Kerrison punches.  This completed the decompression.  We now turned our attention to the instrumentation.  Under both AP and lateral fluoroscopy I cannulated the bilateral T7, T8 and T9 pedicles with the bone probe.  We removed the probe and felt inside the tract with the ball probe and ruled out cortical breaches.  I tapped the pedicles bilaterally at T7, T8-T9.  I inserted a 5.0 x 40 mm pedicle bilateral T7 and 5.5 x 40 bilaterally at T8-T9.  We got good bony purchase.  We bent a Z rod to the appropriate configuration to connect the screws.  I then used the connectors to attach a Z rod from T7-8  on 1 side and T8-9 on the other side.  We secured all the rod to the screws by placing the caps.  We tightened the caps and the connector knots appropriately.  This completed his instrumentation.  We now turned our attention to the posterior arthrodesis.  I used a high-speed drill to decorticate the facets at T7-8, T8-9 T9-10 and T10-11.  We laid a combination of osteocell, local morselized autograft bone we obtained your decompression and intra grow bone graft extender  over these decorticated posterior lateral structures completing the posterior lateral thesis at T7-8, T8-9, T9-10 and T10-11.  We then obtained hemostasis with bipolar cautery.  We irrigated the wound out.  We remove the retractors.  We then reapproximated the patient's  thoracic fascia with interrupted #1 Vicryl suture.  We reapproximated the subcutaneous tissue with interrupted 2-0 Vicryl suture.  We reapproximated the skin with Steri-Strips and benzoin.  The wound was then coated with bacitracin ointment.  A sterile dressing was applied.  The drapes were removed.  By report all sponge, instrument, and needle counts were correct at the end of this case.

## 2020-12-15 NOTE — Anesthesia Procedure Notes (Signed)
Procedure Name: Intubation Date/Time: 12/15/2020 7:46 AM Performed by: Betha Loa, CRNA Pre-anesthesia Checklist: Patient identified, Emergency Drugs available, Suction available and Patient being monitored Patient Re-evaluated:Patient Re-evaluated prior to induction Oxygen Delivery Method: Circle System Utilized Preoxygenation: Pre-oxygenation with 100% oxygen Induction Type: IV induction Ventilation: Mask ventilation without difficulty Laryngoscope Size: Mac and 3 Grade View: Grade I Tube type: Oral Number of attempts: 1 Airway Equipment and Method: Stylet and Oral airway Placement Confirmation: ETT inserted through vocal cords under direct vision, positive ETCO2 and breath sounds checked- equal and bilateral Secured at: 21 cm Tube secured with: Tape Dental Injury: Teeth and Oropharynx as per pre-operative assessment

## 2020-12-16 LAB — CBC
HCT: 29.4 % — ABNORMAL LOW (ref 36.0–46.0)
Hemoglobin: 9.8 g/dL — ABNORMAL LOW (ref 12.0–15.0)
MCH: 29.5 pg (ref 26.0–34.0)
MCHC: 33.3 g/dL (ref 30.0–36.0)
MCV: 88.6 fL (ref 80.0–100.0)
Platelets: 157 K/uL (ref 150–400)
RBC: 3.32 MIL/uL — ABNORMAL LOW (ref 3.87–5.11)
RDW: 14.2 % (ref 11.5–15.5)
WBC: 6.4 K/uL (ref 4.0–10.5)
nRBC: 0 % (ref 0.0–0.2)

## 2020-12-16 LAB — BASIC METABOLIC PANEL
Anion gap: 6 (ref 5–15)
BUN: 7 mg/dL — ABNORMAL LOW (ref 8–23)
CO2: 30 mmol/L (ref 22–32)
Calcium: 8 mg/dL — ABNORMAL LOW (ref 8.9–10.3)
Chloride: 97 mmol/L — ABNORMAL LOW (ref 98–111)
Creatinine, Ser: 0.77 mg/dL (ref 0.44–1.00)
GFR, Estimated: >60 mL/min (ref >60)
Glucose, Bld: 99 mg/dL (ref 70–99)
Potassium: 3.2 mmol/L — ABNORMAL LOW (ref 3.5–5.1)
Sodium: 133 mmol/L — ABNORMAL LOW (ref 135–145)

## 2020-12-16 NOTE — Progress Notes (Signed)
Subjective: The patient is alert and pleasant.  She is in no apparent distress.  Her back is appropriately sore.  Objective: Vital signs in last 24 hours: Temp:  [97.6 F (36.4 C)-98.2 F (36.8 C)] 98 F (36.7 C) (12/06 0335) Pulse Rate:  [60-73] 61 (12/06 0338) Resp:  [8-20] 18 (12/06 0335) BP: (86-130)/(37-50) 93/46 (12/06 0338) SpO2:  [93 %-100 %] 97 % (12/06 0335) Estimated body mass index is 18.36 kg/m as calculated from the following:   Height as of this encounter: 5' (1.524 m).   Weight as of this encounter: 42.6 kg.   Intake/Output from previous day: 12/05 0701 - 12/06 0700 In: 2220 [P.O.:120; I.V.:2000; IV Piggyback:50] Out: 300 [Urine:100; Blood:200] Intake/Output this shift: No intake/output data recorded.  Physical exam the patient is alert and pleasant.  She is moving her lower extremities well.  Lab Results: Recent Labs    12/16/20 0548  WBC 6.4  HGB 9.8*  HCT 29.4*  PLT 157   BMET Recent Labs    12/16/20 0548  NA 133*  K 3.2*  CL 97*  CO2 30  GLUCOSE 99  BUN 7*  CREATININE 0.77  CALCIUM 8.0*    Studies/Results: DG Thoracic Spine 2 View  Result Date: 12/15/2020 CLINICAL DATA:  Thoracolumbar fusion EXAM: THORACIC SPINE 2 VIEWS COMPARISON:  11/15/2018 FINDINGS: C-arm images show extension of the thoracolumbar fusion with pedicle screws down place at the 3 levels above the prior fusion. IMPRESSION: Extension of the thoracolumbar fusion with placement of pedicle screws at the 3 levels above the prior upper margin of the fusion. Electronically Signed   By: Paulina Fusi M.D.   On: 12/15/2020 10:40   DG C-Arm 1-60 Min-No Report  Result Date: 12/15/2020 Fluoroscopy was utilized by the requesting physician.  No radiographic interpretation.    Assessment/Plan: Postop day #1: We will mobilize her with PT and OT.  She is not quite ready to go home yet.  Hypotension: Her hemoglobin is okay.  She is asymptomatic.  LOS: 1 day     Carrie Austin 12/16/2020, 7:47 AM     Patient ID: Carrie Austin, female   DOB: [birth year], 75 y.o.   MRN: [id number]

## 2020-12-16 NOTE — Evaluation (Signed)
Physical Therapy Evaluation Patient Details Name: Carrie Austin MRN: [id number] DOB: [birth year] Today's Date: 12/16/2020  History of Present Illness  Pt is a 75 y/o female admitted 12/5 with pseudoarthrosis/proximal junctional kyphosis at T9. S/P thoracolumbar fusion extenion T7 to illium. PMH includes: anxiety, fibromyalgia, HTN, prior back surgery.   Clinical Impression  Patient is s/p above surgery resulting in the deficits listed below (see PT Problem List). Pt with increased pain due ot limited pain medicine secondary to low BP. Pt functioning a min guard level use of RW for ambulation. Patient will benefit from skilled PT to increase their independence and safety with mobility (while adhering to their precautions) to allow discharge to the venue listed below.        Recommendations for follow up therapy are one component of a multi-disciplinary discharge planning process, led by the attending physician.  Recommendations may be updated based on patient status, additional functional criteria and insurance authorization.  Follow Up Recommendations No PT follow up    Assistance Recommended at Discharge Intermittent Supervision/Assistance  Functional Status Assessment Patient has had a recent decline in their functional status and demonstrates the ability to make significant improvements in function in a reasonable and predictable amount of time.  Equipment Recommendations  Rolling walker (2 wheels) (youth)    Recommendations for Other Services       Precautions / Restrictions Precautions Precautions: Back Precaution Booklet Issued: Yes (comment) Precaution Comments: reviewed with pt, min cueing to adhere functionally Required Braces or Orthoses: Spinal Brace Spinal Brace: Lumbar corset;Applied in sitting position (friend bringing TLSO) Restrictions Weight Bearing Restrictions: No      Mobility  Bed Mobility Overal bed mobility: Modified Independent Bed Mobility:  Rolling;Sidelying to Sit Rolling: Modified independent (Device/Increase time) Sidelying to sit: Modified independent (Device/Increase time)     Sit to sidelying: Modified independent (Device/Increase time) General bed mobility comments: verbal cues on log roll technique, increased time, no physical assist    Transfers Overall transfer level: Needs assistance Equipment used: None Transfers: Sit to/from Stand Sit to Stand: Supervision           General transfer comment: increased time, slow guarded    Ambulation/Gait Ambulation/Gait assistance: Min guard Gait Distance (Feet): 200 Feet Assistive device: Rolling walker (2 wheels);None Gait Pattern/deviations: Step-through pattern;Decreased stride length;Trunk flexed Gait velocity: dec Gait velocity interpretation: 1.31 - 2.62 ft/sec, indicative of limited community ambulator   General Gait Details: pt began amb without AD however was slow, guard, demo'd incresaed trunk flexion and was reaching for things to hold onto, pt then given a RW s/p 40' and pt with good upright posture, increased step length and height, and no antalgia  Stairs Stairs: Yes Stairs assistance: Min guard Stair Management: One rail Left;Step to pattern Number of Stairs: 5 General stair comments: verbal cues for step to pattern and sequencing  Wheelchair Mobility    Modified Rankin (Stroke Patients Only)       Balance Overall balance assessment: Needs assistance Sitting-balance support: No upper extremity supported;Feet supported Sitting balance-Leahy Scale: Good     Standing balance support: Reliant on assistive device for balance;During functional activity Standing balance-Leahy Scale: Poor Standing balance comment: relies on BUE support dyanmically                             Pertinent Vitals/Pain Pain Assessment: 0-10 Pain Score: 5  Faces Pain Scale: Hurts little more Pain Location: incisional Pain Descriptors / Indicators:  Discomfort;Operative site guarding Pain Intervention(s): Monitored during session    Home Living Family/patient expects to be discharged to:: Private residence Living Arrangements: Alone (going to stay with friend for a few days first before going home alone) Available Help at Discharge: Family;Friend(s) Type of Home: Other(Comment) (condo) Home Access: Level entry       Home Layout: One level Home Equipment: Shower seat - built in (friend has walk in shower with a seat) Additional Comments: plans to dc to friends for a few days, then back home.  will have inital 24/7 then intermittent once home    Prior Function Prior Level of Function : Independent/Modified Independent                     Hand Dominance   Dominant Hand: Right    Extremity/Trunk Assessment   Upper Extremity Assessment Upper Extremity Assessment: Defer to OT evaluation    Lower Extremity Assessment Lower Extremity Assessment: Generalized weakness    Cervical / Trunk Assessment Cervical / Trunk Assessment: Back Surgery  Communication   Communication: No difficulties  Cognition Arousal/Alertness: Awake/alert Behavior During Therapy: WFL for tasks assessed/performed Overall Cognitive Status: Within Functional Limits for tasks assessed                                          General Comments General comments (skin integrity, edema, etc.): incision covered by honeycomb, pt stated her BP was low so she couldn't take her pain medicine    Exercises     Assessment/Plan    PT Assessment Patient needs continued PT services  PT Problem List Decreased strength;Decreased activity tolerance;Decreased balance;Decreased mobility;Decreased knowledge of use of DME       PT Treatment Interventions DME instruction;Gait training;Stair training;Functional mobility training;Therapeutic exercise;Balance training;Therapeutic activities    PT Goals (Current goals can be found in the Care Plan  section)  Acute Rehab PT Goals Patient Stated Goal: feel better PT Goal Formulation: With patient Time For Goal Achievement: 12/30/20 Potential to Achieve Goals: Good    Frequency Min 5X/week   Barriers to discharge        Co-evaluation               AM-PAC PT "6 Clicks" Mobility  Outcome Measure Help needed turning from your back to your side while in a flat bed without using bedrails?: None Help needed moving from lying on your back to sitting on the side of a flat bed without using bedrails?: None Help needed moving to and from a bed to a chair (including a wheelchair)?: A Little Help needed standing up from a chair using your arms (e.g., wheelchair or bedside chair)?: A Little Help needed to walk in hospital room?: A Little Help needed climbing 3-5 steps with a railing? : A Little 6 Click Score: 20    End of Session Equipment Utilized During Treatment: Back brace Activity Tolerance: Patient tolerated treatment well Patient left: in bed;with call bell/phone within reach (left sitting EOB with OT) Nurse Communication: Mobility status PT Visit Diagnosis: Unsteadiness on feet (R26.81);Difficulty in walking, not elsewhere classified (R26.2)    Time: 2725-3664 PT Time Calculation (min) (ACUTE ONLY): 20 min   Charges:   PT Evaluation $PT Eval Low Complexity: 1 Low          Lewis Shock, PT, DPT Acute Rehabilitation Services Pager #: [phone] Office #: [phone]  Izamar Linden M Nakyla Bracco 12/16/2020, 9:58 AM

## 2020-12-16 NOTE — Evaluation (Signed)
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 S/P thoracolumbar fusion extenion T7 to illium. PMH includes: anxiety, fibromyalgia, HTN, prior back surgery.   Clinical Impression   Patient admitted for above and limited by back precautions and adherence, pain, and decreased activity tolerance. Pt educated on brace mgmt and wear schedule, precautions, recommendations, ADL compensatory techniques and safety.  She requires up to min assist for LB ADLs, completing transfers and mobility with supervision using RW.  She requires min cueing for adherence to precautions during session (specifically twisting).  She will benefit from further OT services acutely to optimize independence and safety, but anticipate no further needs after dc home.        Recommendations for follow up therapy are one component of a multi-disciplinary discharge planning process, led by the attending physician.  Recommendations may be updated based on patient status, additional functional criteria and insurance authorization.   Follow Up Recommendations  No OT follow up    Assistance Recommended at Discharge Intermittent Supervision/Assistance  Functional Status Assessment  Patient has had a recent decline in their functional status and demonstrates the ability to make significant improvements in function in a reasonable and predictable amount of time.  Equipment Recommendations  None recommended by OT    Recommendations for Other Services       Precautions / Restrictions Precautions Precautions: Back Precaution Booklet Issued: Yes (comment) Precaution Comments: reviewed with pt, min cueing to adhere functionally Required Braces or Orthoses: Spinal Brace Spinal Brace: Lumbar corset;Applied in sitting position (friend  bringing TLSO) Restrictions Weight Bearing Restrictions: No      Mobility Bed Mobility Overal bed mobility: Needs Assistance Bed Mobility: Sit to Sidelying;Rolling Rolling: Modified independent (Device/Increase time)       Sit to sidelying: Modified independent (Device/Increase time) General bed mobility comments: no assist required, good recall of technique without cueing    Transfers Overall transfer level: Needs assistance Equipment used: Rolling walker (2 wheels) Transfers: Sit to/from Stand Sit to Stand: Supervision           General transfer comment: cueing for hand placement, good posture      Balance Overall balance assessment: Needs assistance Sitting-balance support: No upper extremity supported;Feet supported Sitting balance-Leahy Scale: Good     Standing balance support: Reliant on assistive device for balance;During functional activity Standing balance-Leahy Scale: Poor Standing balance comment: relies on BUE support dyanmically                           ADL either performed or assessed with clinical judgement   ADL Overall ADL's : Needs assistance/impaired     Grooming: Supervision/safety;Standing       Lower Body Bathing: Minimal assistance;Sit to/from stand   Upper Body Dressing : Set up;Supervision/safety;Sitting Upper Body Dressing Details (indicate cue type and reason): cueing for brace mgmt Lower Body Dressing: Minimal assistance;Sit to/from stand Lower Body Dressing Details (indicate cue type and reason): requires assist to don socks, has reacher and reviewed compensatory techniques- may need sock aide education Toilet Transfer: Supervision/safety;Ambulation;Rolling walker (2 wheels) Toilet Transfer Details (indicate cue type and reason): cueing for hand placement and technique Toileting- Clothing Manipulation and Hygiene: Supervision/safety;Sit to/from stand;Cueing for compensatory techniques;Cueing for back precautions        Functional mobility during ADLs: Supervision/safety;Rolling walker (2  wheels) General ADL Comments: reviewed compensatory techniques for ADLs, min cueing to adhere to no twisting during toileting     Vision         Perception     Praxis      Pertinent Vitals/Pain Pain Assessment: Faces Faces Pain Scale: Hurts little more Pain Location: incisional Pain Descriptors / Indicators: Discomfort;Operative site guarding Pain Intervention(s): Limited activity within patient's tolerance;Monitored during session;Repositioned     Hand Dominance Right   Extremity/Trunk Assessment Upper Extremity Assessment Upper Extremity Assessment: Overall WFL for tasks assessed   Lower Extremity Assessment Lower Extremity Assessment: Defer to PT evaluation       Communication Communication Communication: No difficulties   Cognition Arousal/Alertness: Awake/alert Behavior During Therapy: WFL for tasks assessed/performed Overall Cognitive Status: Within Functional Limits for tasks assessed                                       General Comments       Exercises     Shoulder Instructions      Home Living Family/patient expects to be discharged to:: Private residence Living Arrangements: Alone Available Help at Discharge: Family;Friend(s) Type of Home: Other(Comment) (condo)       Home Layout: One level     Bathroom Shower/Tub: Producer, television/film/video: Standard Bathroom Accessibility: Yes   Home Equipment: Shower seat - built in   Additional Comments: plans to dc to friends for a few days, then back home.  will have inital 24/7 then intermittent once home      Prior Functioning/Environment Prior Level of Function : Independent/Modified Independent                        OT Problem List: Decreased activity tolerance;Impaired balance (sitting and/or standing);Decreased knowledge of use of DME or AE;Decreased knowledge of precautions;Pain       OT Treatment/Interventions: Self-care/ADL training;Therapeutic exercise;DME and/or AE instruction;Therapeutic activities;Patient/family education;Balance training    OT Goals(Current goals can be found in the care plan section) Acute Rehab OT Goals Patient Stated Goal: home tomorrow OT Goal Formulation: With patient Time For Goal Achievement: 12/30/20 Potential to Achieve Goals: Good  OT Frequency: Min 2X/week   Barriers to D/C:            Co-evaluation              AM-PAC OT "6 Clicks" Daily Activity     Outcome Measure Help from another person eating meals?: None Help from another person taking care of personal grooming?: A Little Help from another person toileting, which includes using toliet, bedpan, or urinal?: A Little Help from another person bathing (including washing, rinsing, drying)?: A Little Help from another person to put on and taking off regular upper body clothing?: A Little Help from another person to put on and taking off regular lower body clothing?: A Little 6 Click Score: 19   End of Session Equipment Utilized During Treatment: Rolling walker (2 wheels);Back brace Nurse Communication: Mobility status  Activity Tolerance: Patient tolerated treatment well Patient left: in bed;with call bell/phone within reach  OT Visit Diagnosis: Unsteadiness on feet (R26.81);Pain Pain - part of body:  (incisional back)                Time: 1740-8144 OT Time Calculation (min): 15 min Charges:  OT General Charges $OT Visit: 1 Visit OT Evaluation $  OT Eval Low Complexity: 1 Low  Barry Brunner, OT Acute Rehabilitation Services Pager [phone] Office [phone]   Chancy Milroy 12/16/2020, 9:02 AM

## 2020-12-17 MED ORDER — TIZANIDINE HCL 2 MG PO TABS: 2.0000 mg | ORAL_TABLET | Freq: Four times a day (QID) | ORAL | 0 refills | Status: DC | PRN

## 2020-12-17 MED ORDER — OXYCODONE-ACETAMINOPHEN 5-325 MG PO TABS: 1.0000 | ORAL_TABLET | ORAL | 0 refills | Status: AC | PRN

## 2020-12-17 MED FILL — Thrombin For Soln 5000 Unit: CUTANEOUS | Qty: 5000 | Status: AC

## 2020-12-17 MED FILL — Sodium Chloride IV Soln 0.9%: INTRAVENOUS | Qty: 1000 | Status: AC

## 2020-12-17 MED FILL — Heparin Sodium (Porcine) Inj 1000 Unit/ML: INTRAMUSCULAR | Qty: 30 | Status: AC

## 2020-12-17 NOTE — Progress Notes (Signed)
   Providing Compassionate, Quality Care - Together   Subjective: Patient reports she is hesitant to go home today.  Objective: Vital signs in last 24 hours: Temp:  [98.6 F (37 C)-99.6 F (37.6 C)] 98.6 F (37 C) (12/07 0720) Pulse Rate:  [71-74] 72 (12/07 0720) Resp:  [16-18] 16 (12/07 0720) BP: (102-116)/(43-71) 105/45 (12/07 0720) SpO2:  [91 %-99 %] 99 % (12/07 0720)  Intake/Output from previous day: No intake/output data recorded. Intake/Output this shift: No intake/output data recorded.  Alert and oriented x 4 PERRLA CN II-XII grossly intact MAE, Strength and sensation intact Incision is covered with Honeycomb dressing and Steri Strips; Dressing is dry and intact, with a small amount of dries sanguinous drainage   Lab Results: Recent Labs    12/16/20 0548  WBC 6.4  HGB 9.8*  HCT 29.4*  PLT 157   BMET Recent Labs    12/16/20 0548  NA 133*  K 3.2*  CL 97*  CO2 30  GLUCOSE 99  BUN 7*  CREATININE 0.77  CALCIUM 8.0*    Studies/Results: No results found.  Assessment/Plan: Patient underwent extension of her lumbar fusion to T7 on 12/15/2020. She is mobilizing well, but is hesitant to discharge home.   LOS: 2 days   -Patient will either discharge later today or in the morning. -Continue to mobilize   Val Eagle, DNP, AGNP-C Nurse Practitioner  [facility] [address], [address], [place], [place] [address] P: [phone]    F: [phone]  12/17/2020, 11:24 AM

## 2020-12-17 NOTE — Progress Notes (Signed)
Patient alert and oriented, mae's well, voiding adequate amount of urine, swallowing without difficulty, no c/o pain at time of discharge. Patient discharged home with family. Script and discharged instructions given to patient. Patient and family stated understanding of instructions given. Patient has an appointment with Dr. Jenkins in 2 weeks 

## 2020-12-17 NOTE — Progress Notes (Signed)
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 S/P thoracolumbar fusion extenion T7 to illium. PMH includes: anxiety, fibromyalgia, HTN, prior back surgery.    PT Comments    Pt much improved from yesterday and reports she feels much better. Pt ambulating in room without AD, uses RW for longer distances. Pt with improved pain management and is able to recall all precautions and don/doff TLSO indep. Pt with no further acute PT needs at this time. PT SIGNING OFF. Please re-consult if needed in future.    Recommendations for follow up therapy are one component of a multi-disciplinary discharge planning process, led by the attending physician.  Recommendations may be updated based on patient status, additional functional criteria and insurance authorization.  Follow Up Recommendations  No PT follow up     Assistance Recommended at Discharge Intermittent Supervision/Assistance  Equipment Recommendations  Rolling walker (2 wheels) (youth)    Recommendations for Other Services       Precautions / Restrictions Precautions Precautions: Back Precaution Booklet Issued: Yes (comment) Precaution Comments: reviewed with pt, min cueing to adhere functionally Required Braces or Orthoses: Spinal Brace Spinal Brace: Applied in sitting position;Thoracolumbosacral orthotic (pt Indep with donning/doffing brace) Restrictions Weight Bearing Restrictions: No     Mobility  Bed Mobility Overal bed mobility: Modified Independent Bed Mobility: Rolling;Sidelying to Sit Rolling: Modified independent (Device/Increase time) Sidelying to sit: Modified independent (Device/Increase time)     Sit to sidelying: Modified independent (Device/Increase time) General bed mobility comments: pt with good  carry over of log rollt echnique    Transfers Overall transfer level: Needs assistance Equipment used: None Transfers: Sit to/from Stand Sit to Stand: Modified independent (Device/Increase time)           General transfer comment: increased time, slow guarded, improved from yesterday    Ambulation/Gait Ambulation/Gait assistance: Supervision Gait Distance (Feet): 300 Feet Assistive device: Rolling walker (2 wheels);None Gait Pattern/deviations: Step-through pattern Gait velocity: dec Gait velocity interpretation: <1.31 ft/sec, indicative of household ambulator   General Gait Details: pt much improved from yesterday, amb 200' with RW and then 100' without, pt more steady and stable than yesterday when amb without AD, instructed pt to use her judgement on if she need RW or not   Stairs Stairs: Yes Stairs assistance: Min guard Stair Management: One rail Right;Alternating pattern Number of Stairs: 2 General stair comments: pt with good technique   Wheelchair Mobility    Modified Rankin (Stroke Patients Only)       Balance Overall balance assessment: Needs assistance Sitting-balance support: No upper extremity supported;Feet supported Sitting balance-Leahy Scale: Good     Standing balance support: Reliant on assistive device for balance;During functional activity Standing balance-Leahy Scale: Fair Standing balance comment: much improved from yestesrday                            Cognition Arousal/Alertness: Awake/alert Behavior During Therapy: [facility] for tasks assessed/performed Overall Cognitive Status: Within Functional Limits for tasks assessed                                          Exercises      General Comments General comments (skin integrity,  edema, etc.): incision covered by dressing, BP much improved      Pertinent Vitals/Pain Pain Assessment: 0-10 Pain Score: 6  Pain Location: incisional Pain Descriptors /  Indicators: Discomfort;Operative site guarding    Home Living                          Prior Function            PT Goals (current goals can now be found in the care plan section) Progress towards PT goals: Goals met/education completed, patient discharged from PT    Frequency           PT Plan  (pt d/c'd - no longer needs acute PT services)    Co-evaluation              AM-PAC PT "6 Clicks" Mobility   Outcome Measure  Help needed turning from your back to your side while in a flat bed without using bedrails?: None Help needed moving from lying on your back to sitting on the side of a flat bed without using bedrails?: None Help needed moving to and from a bed to a chair (including a wheelchair)?: None Help needed standing up from a chair using your arms (e.g., wheelchair or bedside chair)?: None Help needed to walk in hospital room?: A Little Help needed climbing 3-5 steps with a railing? : A Little 6 Click Score: 22    End of Session Equipment Utilized During Treatment: Back brace Activity Tolerance: Patient tolerated treatment well Patient left: in bed;with call bell/phone within reach Nurse Communication: Mobility status PT Visit Diagnosis: Unsteadiness on feet (R26.81);Difficulty in walking, not elsewhere classified (R26.2)     Time: 3358-2518 PT Time Calculation (min) (ACUTE ONLY): 12 min  Charges:  $Gait Training: 8-22 mins                     Kittie Plater, PT, DPT Acute Rehabilitation Services Pager #: [phone] Office #: [phone]    Berline Lopes 12/17/2020, 9:17 AM

## 2020-12-17 NOTE — Discharge Instructions (Signed)
Wound Care Keep incision covered and dry for three days.   Do not put any creams, lotions, or ointments on incision. Leave steri-strips on back.  They will fall off by themselves. Activity Walk each and every day, increasing distance each day. No lifting greater than 5 lbs.  Avoid excessive neck motion. No driving for 2 weeks; may ride as a passenger locally. If provided with back brace, wear when out of bed.  It is not necessary to wear brace in bed. Diet Resume your normal diet.  Return to Work Will be discussed at you follow up appointment. Call Your Doctor If Any of These Occur Redness, drainage, or swelling at the wound.  Temperature greater than 101 degrees. Severe pain not relieved by pain medication. Incision starts to come apart. Follow Up Appt Call today for appointment in 1-2 weeks ([phone]) or for problems.  If you have any hardware placed in your spine, you will need an x-ray before your appointment.  

## 2020-12-17 NOTE — Progress Notes (Addendum)
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 S/P thoracolumbar fusion extenion T7 to illium. PMH includes: anxiety, fibromyalgia, HTN, prior back surgery.   OT comments  Patient continues to make steady progress towards goals in skilled OT session. Patient's session encompassed education with regard to adaptive equipment (sock aide), functional mobility to complete ADLs, and toileting. Patient much improved with overall bed mobility and transfers, and able to complete functional mobility without use of RW. Patient educated on sock aide to assist with dressing to maintain back precautions with good return demonstration noted (handout provided at end of session for purchase). Patient able to demonstrate good technique with toilet transfers and toileting adhering to back precautions. Discharge recommendations remain appropriate; OT to sign off at this time.    Recommendations for follow up therapy are one component of a multi-disciplinary discharge planning process, led by the attending physician.  Recommendations may be updated based on patient status, additional functional criteria and insurance authorization.    Follow Up Recommendations  No OT follow up    Assistance Recommended at Discharge Intermittent Supervision/Assistance  Equipment Recommendations  None recommended by OT    Recommendations for Other Services      Precautions / Restrictions Precautions Precautions: Back Precaution Booklet Issued: Yes (comment) Precaution Comments: reviewed with pt, able to demonstrate appropriately Required Braces or Orthoses: Spinal Brace Spinal Brace: Applied in sitting position;Thoracolumbosacral orthotic (patient is independent in donning and doffing) Restrictions Weight Bearing Restrictions:  No       Mobility Bed Mobility Overal bed mobility: Modified Independent Bed Mobility: Rolling;Sidelying to Sit Rolling: Modified independent (Device/Increase time) Sidelying to sit: Modified independent (Device/Increase time)     Sit to sidelying: Modified independent (Device/Increase time) General bed mobility comments: pt with good carry over of log roll technique    Transfers Overall transfer level: Needs assistance Equipment used: None Transfers: Sit to/from Stand Sit to Stand: Modified independent (Device/Increase time)           General transfer comment: good demonstration, increased confidence with less gaurding after completing with PT earlier this AM     Balance Overall balance assessment: Needs assistance Sitting-balance support: No upper extremity supported;Feet supported Sitting balance-Leahy Scale: Good     Standing balance support: Reliant on assistive device for balance;During functional activity Standing balance-Leahy Scale: Fair Standing balance comment: much improved from yesterday                           ADL either performed or assessed with clinical judgement   ADL Overall ADL's : Needs assistance/impaired     Grooming: Supervision/safety;Standing;Wash/dry hands;Wash/dry face;Oral care               Lower Body Dressing: Set up;Sitting/lateral leans Lower Body Dressing Details (indicate cue type and reason): able to demonstrate doffing of socks with hiking hip up to doff, provided education with sock aide and patient able to demonstrate Toilet Transfer: Supervision/safety;Ambulation Toilet Transfer Details (indicate cue type and reason): improved mobility to complete without RW in session Toileting- Clothing Manipulation and Hygiene: Supervision/safety;Sit to/from stand       Functional mobility during ADLs: Supervision/safety General ADL Comments: patient able to complete sock aide education with min verbal cues for  thoroughness, supervision for functional ambulation and toileting and transfers  Extremity/Trunk Assessment              Vision       Perception     Praxis      Cognition Arousal/Alertness: Awake/alert Behavior During Therapy: WFL for tasks assessed/performed Overall Cognitive Status: Within Functional Limits for tasks assessed                                            Exercises     Shoulder Instructions       General Comments incision covered by dressing, BP much improved    Pertinent Vitals/ Pain       Pain Assessment: Faces Pain Score: 6  Faces Pain Scale: Hurts a little bit Pain Location: incisional Pain Descriptors / Indicators: Discomfort;Operative site guarding Pain Intervention(s): Monitored during session  Home Living                                          Prior Functioning/Environment              Frequency  Min 2X/week        Progress Toward Goals  OT Goals(current goals can now be found in the care plan section)  Progress towards OT goals: Progressing toward goals  Acute Rehab OT Goals Patient Stated Goal: home today OT Goal Formulation: With patient Time For Goal Achievement: 12/30/20 Potential to Achieve Goals: Good  Plan All goals met and education completed, patient discharged from OT services;Discharge plan remains appropriate    Co-evaluation                 AM-PAC OT "6 Clicks" Daily Activity     Outcome Measure   Help from another person eating meals?: None Help from another person taking care of personal grooming?: None Help from another person toileting, which includes using toliet, bedpan, or urinal?: None Help from another person bathing (including washing, rinsing, drying)?: A Little Help from another person to put on and taking off regular upper body clothing?: None Help from another person to put on and taking off regular lower body clothing?: None 6 Click Score:  23    End of Session Equipment Utilized During Treatment: Back brace  OT Visit Diagnosis: Unsteadiness on feet (R26.81);Pain Pain - part of body:  (incision on back)   Activity Tolerance Patient tolerated treatment well   Patient Left Other (comment) (patient completing morning routine in bathroom)   Nurse Communication Mobility status        Time: 5051-8335 OT Time Calculation (min): 18 min  Charges: OT General Charges $OT Visit: 1 Visit OT Treatments $Self Care/Home Management : 8-22 mins  Corinne Ports E. [place], [place] Acute Rehabilitation Services [place] 12/17/2020, 9:30 AM

## 2020-12-17 NOTE — Discharge Summary (Signed)
Physician Discharge Summary     Providing Compassionate, Quality Care - Together   Patient ID: Carrie Austin MRN: [id number] DOB/AGE: [birth year] 75 y.o.  Admit date: 12/15/2020 Discharge date: 12/17/2020  Admission Diagnoses: Kyphosis of thoracic spine  Discharge Diagnoses:  Principal Problem:   Kyphosis of thoracic region   Discharged Condition: good  Hospital Course: Patient underwent extension of her thoracolumbar fusion up to T7 by Dr. Lovell Sheehan on 12/15/2020. She was admitted to 3C02 following recovery from anesthesia in the PACU. Her postoperative course has been uncomplicated. She has worked with both physical and occupational therapies who feel the patient is ready for discharge home. She is ambulating independently and without difficulty. She is tolerating a normal diet. She is not having any bowel or bladder dysfunction. Her pain is well-controlled with oral pain medication. She is ready for discharge home.   Consults: PT, OT  Significant Diagnostic Studies: radiology: DG Thoracic Spine 2 View  Result Date: 12/15/2020 CLINICAL DATA:  Thoracolumbar fusion EXAM: THORACIC SPINE 2 VIEWS COMPARISON:  11/15/2018 FINDINGS: C-arm images show extension of the thoracolumbar fusion with pedicle screws down place at the 3 levels above the prior fusion. IMPRESSION: Extension of the thoracolumbar fusion with placement of pedicle screws at the 3 levels above the prior upper margin of the fusion. Electronically Signed   By: Paulina Fusi M.D.   On: 12/15/2020 10:40   DG C-Arm 1-60 Min-No Report  Result Date: 12/15/2020 Fluoroscopy was utilized by the requesting physician.  No radiographic interpretation.     Treatments: surgery:  Exploration of lumbar fusion/removal of lumbar hardware; bilateral T9-10 laminotomy/foraminotomy to decompress the bilateral T9 nerve roots; posterior lateral arthrodesis and T7-8, T8-9, T9-10 and T10-11 with local morselized autograft bone ,intra grow bone graft  extender and osteocell; posterior segmental instrumentation T7 to the ilium with globus titanium screws and rods;  Discharge Exam: Blood pressure (!) 105/45, pulse 72, temperature 98.6 F (37 C), temperature source Oral, resp. rate 16, height 5' (1.524 m), weight 42.6 kg, SpO2 99 %.  Alert and oriented x 4 PERRLA CN II-XII grossly intact MAE, Strength and sensation intact Incision is covered with Honeycomb dressing and Steri Strips; Dressing is dry and intact, with a small amount of dried sanguinous drainage    Disposition: Discharge disposition: 01-Home or Self Care        Allergies as of 12/17/2020       Reactions   Lisinopril Cough   Deep cough   Cyclobenzaprine    Effects bladder    Vancomycin Itching   Amoxicillin Rash   Has patient had a PCN reaction causing immediate rash, facial/tongue/throat swelling, SOB or lightheadedness with hypotension:unsure Has patient had a PCN reaction causing severe rash involving mucus membranes or skin necrosis:unsure Has patient had a PCN reaction that required hospitalization:NO  . Has patient had a PCN reaction occurring within the last 10 years:    Yes If all of the above answers are "NO", then may proceed with Cephalosporin use.   Cephalosporins Other (See Comments)   Flushing of the face        Medication List     STOP taking these medications    COD LIVER OIL PO   cyclobenzaprine 10 MG tablet Commonly known as: FLEXERIL   diclofenac Sodium 1 % Gel Commonly known as: VOLTAREN   meloxicam 7.5 MG tablet Commonly known as: MOBIC       TAKE these medications    atorvastatin 20 MG tablet Commonly known as:  LIPITOR Take 20 mg by mouth every Wednesday.   CALCIUM PO Take 1 tablet by mouth daily.   camphor-menthol lotion Commonly known as: SARNA Apply 1 application topically as needed (rash).   cyanocobalamin 1000 MCG tablet Take 1,000 mcg by mouth daily.   diltiazem 120 MG 24 hr capsule Commonly known as:  CARDIZEM CD Take 120 mg by mouth daily.   docusate sodium 100 MG capsule Commonly known as: COLACE Take 100 mg by mouth 2 (two) times daily.   escitalopram 10 MG tablet Commonly known as: LEXAPRO Take 2 tablets (20 mg total) by mouth daily.   folic acid 1 MG tablet Commonly known as: FOLVITE Take 1 mg by mouth daily.   gabapentin 100 MG capsule Commonly known as: NEURONTIN Take 100 mg by mouth 3 (three) times daily.   hydrochlorothiazide 25 MG tablet Commonly known as: HYDRODIURIL Take 25 mg by mouth daily.   LORazepam 0.5 MG tablet Commonly known as: Ativan 1 bid What changed:  how much to take how to take this when to take this additional instructions   Magnesium 250 MG Tabs Take 250 mg by mouth daily.   methotrexate 2.5 MG tablet Commonly known as: RHEUMATREX Take 15 mg by mouth every Wednesday.   oxybutynin 5 MG 24 hr tablet Commonly known as: DITROPAN-XL Take 5 mg by mouth at bedtime.   oxyCODONE-acetaminophen 5-325 MG tablet Commonly known as: PERCOCET/ROXICET Take 1-2 tablets by mouth every 4 (four) hours as needed for severe pain. What changed:  how much to take when to take this   potassium chloride 10 MEQ tablet Commonly known as: KLOR-CON M Take 10 mEq by mouth daily.   promethazine 25 MG tablet Commonly known as: PHENERGAN Take 25 mg by mouth every 8 (eight) hours as needed for nausea or vomiting.   SOOTHE XP OP Place 1 drop into both eyes 3 (three) times daily as needed (dry eyes).   tiZANidine 2 MG tablet Commonly known as: ZANAFLEX Take 1 tablet (2 mg total) by mouth every 6 (six) hours as needed for muscle spasms.   Vitamin D 50 MCG (2000 UT) Caps Take 2,000 Units by mouth daily.               Durable Medical Equipment  (From admission, onward)           Start     Ordered   12/17/20 1016  For home use only DME Walker youth  Once       Question:  Patient needs a walker to treat with the following condition  Answer:   S/P lumbar spinal fusion   12/17/20 1015             Signed: Val Eagle, DNP, AGNP-C Nurse Practitioner  [facility] [address], [address], [place], [place] [address] P: [phone]    F: [phone]  12/17/2020, 11:38 AM

## 2021-02-25 ENCOUNTER — Other Ambulatory Visit: Payer: Self-pay

## 2021-02-25 ENCOUNTER — Ambulatory Visit (HOSPITAL_BASED_OUTPATIENT_CLINIC_OR_DEPARTMENT_OTHER): Payer: Medicare HMO | Admitting: Psychiatry

## 2021-02-25 DIAGNOSIS — F332 Major depressive disorder, recurrent severe without psychotic features: Secondary | ICD-10-CM | POA: Diagnosis not present

## 2021-02-25 DIAGNOSIS — F325 Major depressive disorder, single episode, in full remission: Secondary | ICD-10-CM | POA: Diagnosis not present

## 2021-02-25 MED ORDER — LORAZEPAM 0.5 MG PO TABS: ORAL_TABLET | ORAL | 5 refills | Status: DC

## 2021-02-25 MED ORDER — ESCITALOPRAM OXALATE 10 MG PO TABS: 20.0000 mg | ORAL_TABLET | Freq: Every day | ORAL | 5 refills | Status: DC

## 2021-02-25 MED ORDER — ESCITALOPRAM OXALATE 10 MG PO TABS: ORAL_TABLET | ORAL | 5 refills | Status: DC

## 2021-02-25 NOTE — Progress Notes (Signed)
BH MD/PA/NP OP Progress Note  02/25/2021 2:28 PM Carrie Austin  MRN:  [id number]  Chief Complaint: Major depression in remission  HPI:    Today the patient is doing very well.  She no longer has rib pain.  She still has a structure around her chest status post back surgery.  This brace will be off over in the next few weeks.  The patient feels very well.  Her mood is good.  The patient is sleeping and eating very well.  She is got good energy.  She notes no problems thinking or concentrating.  She likes reading.  She watches some TV.  She does a lot of shopping as well.  She owns her own home and likes it.  She has a female friend that she is had for over a decade.  The patient has 2 sons and 5 grandchildren.  All of them are doing pretty well.  Patient has no complaints.  She is functioning extremely well.  She takes her medicines just as prescribed.  She is not in therapy at this time.   ICD-10-CM   1. Severe episode of recurrent major depressive disorder, without psychotic features (HCC)  F33.2 escitalopram (LEXAPRO) 10 MG tablet    DISCONTINUED: escitalopram (LEXAPRO) 10 MG tablet      Past Psychiatric History: See intake H&P for full details. Reviewed, with no updates at this time.   Past Medical History:  Past Medical History:  Diagnosis Date   Allergy    Anxiety    Arthritis    Cough    due allergies chronic   Depression    Dilatation of esophagus    Fibromyalgia    GERD (gastroesophageal reflux disease)    Headache(784.0)    migraines   Heart murmur    one time   Hypertension    PONV (postoperative nausea and vomiting)    hx of Vertigo after anesthesia    Past Surgical History:  Procedure Laterality Date   ABDOMINAL HYSTERECTOMY     BACK SURGERY  12/21/2012   CHOLECYSTECTOMY     COLONOSCOPY     DIAGNOSTIC LAPAROSCOPY     lap chole   EYE SURGERY Bilateral    lasik   TUBAL LIGATION      Family Psychiatric History: See intake H&P for full details. Reviewed,  with no updates at this time.   Family History: No family history on file.  Social History:  Social History   Socioeconomic History   Marital status: Widowed    Spouse name: Not on file   Number of children: Not on file   Years of education: Not on file   Highest education level: Not on file  Occupational History   Not on file  Tobacco Use   Smoking status: Never   Smokeless tobacco: Never  Vaping Use   Vaping Use: Never used  Substance and Sexual Activity   Alcohol use: No    Comment: social   Drug use: No   Sexual activity: Never  Other Topics Concern   Not on file  Social History Narrative   Not on file   Social Determinants of Health   Financial Resource Strain: Not on file  Food Insecurity: Not on file  Transportation Needs: Not on file  Physical Activity: Not on file  Stress: Not on file  Social Connections: Not on file    Allergies:  Allergies  Allergen Reactions   Lisinopril Cough    Deep cough   Cyclobenzaprine  Effects bladder    Vancomycin Itching   Amoxicillin Rash    Has patient had a PCN reaction causing immediate rash, facial/tongue/throat swelling, SOB or lightheadedness with hypotension:unsure Has patient had a PCN reaction causing severe rash involving mucus membranes or skin necrosis:unsure Has patient had a PCN reaction that required hospitalization:NO  . Has patient had a PCN reaction occurring within the last 10 years:    Yes If all of the above answers are "NO", then may proceed with Cephalosporin use.    Cephalosporins Other (See Comments)    Flushing of the face    Metabolic Disorder Labs: No results found for: HGBA1C, MPG No results found for: PROLACTIN No results found for: CHOL, TRIG, HDL, CHOLHDL, VLDL, LDLCALC No results found for: TSH  Therapeutic Level Labs: No results found for: LITHIUM No results found for: VALPROATE No components found for:  CBMZ  Current Medications: Current Outpatient Medications   Medication Sig Dispense Refill   Artificial Tear Solution (SOOTHE XP OP) Place 1 drop into both eyes 3 (three) times daily as needed (dry eyes).     atorvastatin (LIPITOR) 20 MG tablet Take 20 mg by mouth every Wednesday.      CALCIUM PO Take 1 tablet by mouth daily.     camphor-menthol (SARNA) lotion Apply 1 application topically as needed (rash).     Cholecalciferol (VITAMIN D) 2000 UNITS CAPS Take 2,000 Units by mouth daily.     cyanocobalamin 1000 MCG tablet Take 1,000 mcg by mouth daily.     diltiazem (CARDIZEM CD) 120 MG 24 hr capsule Take 120 mg by mouth daily.     docusate sodium (COLACE) 100 MG capsule Take 100 mg by mouth 2 (two) times daily.     escitalopram (LEXAPRO) 10 MG tablet 1  qam 30 tablet 5   folic acid (FOLVITE) 1 MG tablet Take 1 mg by mouth daily.     gabapentin (NEURONTIN) 100 MG capsule Take 100 mg by mouth 3 (three) times daily.     hydrochlorothiazide (HYDRODIURIL) 25 MG tablet Take 25 mg by mouth daily.     LORazepam (ATIVAN) 0.5 MG tablet 1 bid 60 tablet 5   Magnesium 250 MG TABS Take 250 mg by mouth daily.     methotrexate (RHEUMATREX) 2.5 MG tablet Take 15 mg by mouth every Wednesday.     oxybutynin (DITROPAN-XL) 5 MG 24 hr tablet Take 5 mg by mouth at bedtime.     oxyCODONE-acetaminophen (PERCOCET/ROXICET) 5-325 MG tablet Take 1-2 tablets by mouth every 4 (four) hours as needed for severe pain. 30 tablet 0   potassium chloride (KLOR-CON) 10 MEQ tablet Take 10 mEq by mouth daily.     promethazine (PHENERGAN) 25 MG tablet Take 25 mg by mouth every 8 (eight) hours as needed for nausea or vomiting.     tiZANidine (ZANAFLEX) 2 MG tablet Take 1 tablet (2 mg total) by mouth every 6 (six) hours as needed for muscle spasms. 30 tablet 0   No current facility-administered medications for this visit.     Musculoskeletal: Strength & Muscle Tone: within normal limits Gait & Station: normal Patient leans: N/A  Psychiatric Specialty Exam: ROS  There were no vitals  taken for this visit.There is no height or weight on file to calculate BMI.  General Appearance: Casual and Fairly Groomed  Eye Contact:  Fair  Speech:  Clear and Coherent and Normal Rate  Volume:  Normal  Mood:  Anxious and Dysphoric  Affect:  Appropriate and  Congruent  Thought Process:  Goal Directed and Descriptions of Associations: Intact  Orientation:  Full (Time, Place, and Person)  Thought Content: Logical   Suicidal Thoughts:  No  Homicidal Thoughts:  No  Memory:  Immediate;   Fair  Judgement:  Intact  Insight:  Shallow  Psychomotor Activity:  Normal  Concentration:  Attention Span: Fair  Recall:  FiservFair  Fund of Knowledge: Fair  Language: Good  Akathisia:  Negative  Handed:  Right  AIMS (if indicated): not done  Assets:  ArchitectCommunication Skills Financial Resources/Insurance Housing Intimacy Social Support Transportation  ADL's:  Intact  Cognition: WNL  Sleep:  Good   Screenings: Flowsheet Row [facility] Pre-Admission Testing 60 [facility]  C-SSRS RISK CATEGORY No Risk No Risk No Risk        Assessment and Plan:     This patient's diagnosis is that of major depression.  She is in remission at this time.  She will continue taking Lexapro 10 mg every morning.  Her second problem is that of an adjustment disorder with an anxious mood state.  She will continue taking Ativan 0.5 mg twice daily.  She drinks no alcohol and uses no drugs has never been psychotic and is functioning very well.  She will return to see me in 5 months. 1. Severe episode of recurrent major depressive disorder, without psychotic features (HCC)     Status of current problems: unchanged  Labs Ordered: No orders of the defined types were placed in this encounter.   Labs Reviewed:  NA  Collateral Obtained/Records Reviewed: NA  Plan:  Continue Xanax, Lexapro, Elavil as prescribed RTC 3 MONTHS   Gypsy BalsamGerald I Debbie Yearick, MD 02/25/2021, 2:28 PM

## 2021-06-02 ENCOUNTER — Ambulatory Visit: Payer: Medicare HMO | Attending: Student | Admitting: Rehabilitative and Restorative Service Providers"

## 2021-06-02 ENCOUNTER — Encounter: Payer: Self-pay | Admitting: Rehabilitative and Restorative Service Providers"

## 2021-06-02 DIAGNOSIS — R293 Abnormal posture: Secondary | ICD-10-CM | POA: Diagnosis not present

## 2021-06-02 DIAGNOSIS — M545 Low back pain, unspecified: Secondary | ICD-10-CM | POA: Diagnosis present

## 2021-06-02 DIAGNOSIS — M6281 Muscle weakness (generalized): Secondary | ICD-10-CM | POA: Insufficient documentation

## 2021-06-02 NOTE — Addendum Note (Signed)
Addended by: Val RilesHOLT, Rhandi Despain P on: 06/02/2021 05:08 PM   Modules accepted: Orders

## 2021-06-02 NOTE — Patient Instructions (Signed)
Access Code: [email or web address] URL: [email or web address] Date: 06/02/2021 Prepared by: Gillermo Murdoch  Exercises - Supine Diaphragmatic Breathing  - 2 x daily - 7 x weekly - 1 sets - 10 reps - 4-6 sec  hold - Seated Sidebending Arms Overhead  - 2 x daily - 7 x weekly - 1 sets - 3 reps - 5-10 sec  hold - Seated Trunk Rotation - Arms Crossed  - 2 x daily - 7 x weekly - 1 sets - 3 reps - 5-10 sec  hold - Supine Lower Trunk Rotation  - 2 x daily - 7 x weekly - 1 sets - 3-5 reps - 10 sec  hold

## 2021-06-02 NOTE — Therapy (Signed)
[place] [facility] [place] [facility] [place], [place], [address] Phone: [phone]   Fax:  [phone]  Physical Therapy Treatment Rationale for Evaluation and Treatment Rehabilitation  Patient Details  Name: Carrie Austin MRN: [id number] Date of Birth: 01-01-1946 Referring Provider (PT): Dr Viona Gilmore   Encounter Date: 06/02/2021   PT End of Session - 06/02/21 1651     Visit Number 1    Number of Visits 12    Date for PT Re-Evaluation 07/15/21    PT Start Time 1319    PT Stop Time 1409    PT Time Calculation (min) 50 min    Activity Tolerance Patient tolerated treatment well             Past Medical History:  Diagnosis Date   Allergy    Anxiety    Arthritis    Cough    due allergies chronic   Depression    Dilatation of esophagus    Fibromyalgia    GERD (gastroesophageal reflux disease)    Headache(784.0)    migraines   Heart murmur    one time   Hypertension    PONV (postoperative nausea and vomiting)    hx of Vertigo after anesthesia    Past Surgical History:  Procedure Laterality Date   ABDOMINAL HYSTERECTOMY     BACK SURGERY  12/21/2012   CHOLECYSTECTOMY     COLONOSCOPY     DIAGNOSTIC LAPAROSCOPY     lap chole   EYE SURGERY Bilateral    lasik   TUBAL LIGATION      There were no vitals filed for this visit.   Subjective Assessment - 06/02/21 1321     Subjective Patient reports that she has had back pain for some time. She had a lumbar fusion 07/10/20 and continued to have pain oin the sides. MRI showed a screw loose. She underwent second lumbar fusion 12/15/20 fusion L2/3 with laminectomy L5/S1. She has had continued pain in the area of the surgery along the incision and the incision area. It feels nub and hurts.    Pertinent History LBP for ~ 25 years, first back surgery 2014 followed by another back surgery and cervical surgery; arthritis; HTN; knee pain; fall 2008 from bed    Patient  Stated Goals decreased the back pain    Currently in Pain? Yes    Pain Score 8     Pain Location Back    Pain Orientation Right;Left;Posterior;Mid;Lower    Pain Descriptors / Indicators Burning;Numbness    Pain Type Acute pain;Chronic pain    Pain Onset More than a month ago    Pain Frequency Constant    Aggravating Factors  worse wth standing > 20-30 min; reaching; twisting; turning; steps    Pain Relieving Factors heat; ice; topical analgesic; pain meds                [facility] PT Assessment - 06/02/21 0001       Assessment   Medical Diagnosis Lumbothoracic pain    Referring Provider (PT) Dr Viona Gilmore    Onset Date/Surgical Date 12/15/20   progressively worsened in the past year to two years - sx 07/10/20   Hand Dominance Right    Next MD Visit 9/23    Prior Therapy here for ~ 10 visits      Precautions   Precautions None      Restrictions   Weight Bearing Restrictions No      Balance Screen  Has the patient fallen in the past 6 months Yes    How many times? 1    Has the patient had a decrease in activity level because of a fear of falling?  No    Is the patient reluctant to leave their home because of a fear of falling?  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    Leisure household chores; walking ~ 1 miles 1-2 times/wk sometimes more up and down some small hills; reading; movies      Observation/Other Assessments   Focus on Therapeutic Outcomes (FOTO)  37      Sensation   Additional Comments numbness along incision; numbness in bilat hands on an intermittent basis      Posture/Postural Control   Posture Comments head forward; shoulders rounded and elevated; flattened thoracolumbar spine      AROM   Overall AROM Comments LE ROM WFL's    AROM Assessment  Site --   discomfort and burning with all lumbar motionsl   Lumbar Flexion 60%    Lumbar Extension 0    Lumbar - Right Side Bend 20%    Lumbar - Left Side Bend 20%    Lumbar - Right Rotation 10%    Lumbar - Left Rotation 10%      Strength   Overall Strength Comments WFL's      Flexibility   Hamstrings tight Rt    Piriformis WFL's bilat                                    PT Education - 06/02/21 1358     Education Details POC HEP    Person(s) Educated Patient    Methods Explanation;Demonstration;Tactile cues;Verbal cues;Handout    Comprehension Verbalized understanding;Returned demonstration;Verbal cues required;Tactile cues required                 PT Long Term Goals - 06/02/21 1658       PT LONG TERM GOAL #1   Title Decrease burning and pain in the thoracolumbar spine and area of the incision    Time 6    Period Weeks    Status New    Target Date 07/15/21      PT LONG TERM GOAL #2   Title Improve spinal mobilty and trunk ROM/mobilty to WFL's allowing patient to move more normally for functional activities    Time 6    Period Weeks    Status New    Target Date 07/15/21      PT LONG TERM GOAL #3   Title Patient to verbalize and demonstrate proper transfers and transitional movements for back care and proper body mechanics    Time 6    Period Weeks    Status New    Target Date 07/15/21      PT LONG TERM GOAL #4   Title Inedpendent in HEP (including aquatic therapy as indicated)    Baseline -    Time 6    Period Weeks    Status New    Target Date 07/15/21      PT LONG TERM GOAL #5   Title Improve functional limitation score to  60    Time 6    Period Weeks    Status New    Target Date 07/15/21                   Plan - 06/02/21 1652     Clinical Impression Statement Patient presents with continued thoracic and lumbar pain following 2 spine surgeries in the past yearr. She reports buring, hurting pain in the area  of the incision and enther side of the incision. She has limited lumbar mobilty/ROM; decreased core stability and strength; facial tightness through the thoracolumbar paraspinals and musculature of the thoracic and lumbar spine; tightness through the thoracic to lumbar incision; decreased functional activities; pain on a constant basis. Patient will benefit from PT to address problems.    Stability/Clinical Decision Making Stable/Uncomplicated    Clinical Decision Making Low    Rehab Potential Good    PT Frequency 2x / week    PT Duration 6 weeks    PT Treatment/Interventions ADLs/Self Care Home Management;Aquatic Therapy;Cryotherapy;Electrical Stimulation;Iontophoresis 4mg /ml Dexamethasone;Moist Heat;Ultrasound;Functional mobility training;Therapeutic activities;Therapeutic exercise;Balance training;Neuromuscular re-education;Patient/family education;Manual techniques;Passive range of motion;Dry needling;Taping    PT Next Visit Plan review and progress exercises; assess response to manual work/myofacial release work; modalities and taping as indicated    PT [place] and Agree with Plan of Care Patient             Patient will benefit from skilled therapeutic intervention in order to improve the following deficits and impairments:  Decreased range of motion, Decreased activity tolerance, Pain, Hypomobility, Impaired flexibility, Improper body mechanics, Decreased mobility, Decreased strength, Increased edema, Impaired sensation, Postural dysfunction  Visit Diagnosis: Thoracolumbar back pain  Muscle weakness (generalized)  Abnormal posture     Problem List Patient Active Problem List   Diagnosis Date Noted   Kyphosis of thoracic region 12/15/2020   Spinal stenosis of lumbar region with neurogenic claudication 07/10/2020   Migraine without aura and without status migrainosus, not intractable 07/09/2019   Loss of consciousness ([place]) 07/09/2019   GERD  (gastroesophageal reflux disease) 10/11/2016   Rheumatoid arthritis involving both hands ([place]) 09/27/2016   Lumbar stenosis with neurogenic claudication 05/17/2016   Impingement syndrome of right shoulder 12/01/2015   Acute superficial gastritis without hemorrhage 07/01/2015   Varicose veins of both lower extremities with pain 05/22/2015   Age-related osteoporosis without current pathological fracture 05/12/2015   Chronic pain syndrome 05/12/2015   Essential hypertension 05/12/2015   Urge incontinence 05/12/2015   Recurrent major depressive disorder, in remission ([place]) 05/12/2015   Mixed hyperlipidemia 05/12/2015   Severe episode of recurrent major depressive disorder, without psychotic features ([place]) 05/12/2015   Obstipation 04/14/2015   Irritable bowel syndrome with constipation 02/26/2015   Other allergic rhinitis 12/26/2013   Spondylolisthesis 12/21/2012   Left lumbar radiculopathy 07/05/2012   Left leg pain 07/05/2012   Left cervical radiculopathy 07/05/2012   Dysphagia 09/02/2011   GERD with stricture 06/22/2011   Stricture of esophagus 06/22/2011    Kenrick Pore Nilda Simmer, PT, MPH  06/02/2021, 5:03 PM  [facility] [place], [place], [address] Phone: [phone]   Fax:  [phone]  Name: Carrie Austin MRN: [id number] Date of Birth: 1945/08/25

## 2021-06-10 ENCOUNTER — Ambulatory Visit: Payer: Medicare HMO | Admitting: Rehabilitative and Restorative Service Providers"

## 2021-06-10 ENCOUNTER — Encounter: Payer: Self-pay | Admitting: Rehabilitative and Restorative Service Providers"

## 2021-06-10 DIAGNOSIS — M546 Pain in thoracic spine: Secondary | ICD-10-CM

## 2021-06-10 DIAGNOSIS — M545 Low back pain, unspecified: Secondary | ICD-10-CM | POA: Diagnosis not present

## 2021-06-10 DIAGNOSIS — R293 Abnormal posture: Secondary | ICD-10-CM

## 2021-06-10 DIAGNOSIS — M6281 Muscle weakness (generalized): Secondary | ICD-10-CM

## 2021-06-10 NOTE — Patient Instructions (Addendum)
Trigger Point Dry Needling  What is Trigger Point Dry Needling (DN)? DN is a physical therapy technique used to treat muscle pain and dysfunction. Specifically, DN helps deactivate muscle trigger points (muscle knots).  A thin filiform needle is used to penetrate the skin and stimulate the underlying trigger point. The goal is for a local twitch response (LTR) to occur and for the trigger point to relax. No medication of any kind is injected during the procedure.   What Does Trigger Point Dry Needling Feel Like?  The procedure feels different for each individual patient. Some patients report that they do not actually feel the needle enter the skin and overall the process is not painful. Very mild bleeding may occur. However, many patients feel a deep cramping in the muscle in which the needle was inserted. This is the local twitch response.   How Will I feel after the treatment? Soreness is normal, and the onset of soreness may not occur for a few hours. Typically this soreness does not last longer than two days.  Bruising is uncommon, however; ice can be used to decrease any possible bruising.  In rare cases feeling tired or nauseous after the treatment is normal. In addition, your symptoms may get worse before they get better, this period will typically not last longer than 24 hours.   What Can I do After My Treatment? Increase your hydration by drinking more water for the next 24 hours. You may place ice or heat on the areas treated that have become sore, however, do not use heat on inflamed or bruised areas. Heat often brings more relief post needling. You can continue your regular activities, but vigorous activity is not recommended initially after the treatment for 24 hours. DN is best combined with other physical therapy such as strengthening, stretching, and other therapies.  Access Code: [email or web address] URL: [email or web address] Date: 06/10/2021 Prepared by: Corlis Leakelyn  Daneil Beem  Exercises - Supine Diaphragmatic Breathing  - 2 x daily - 7 x weekly - 1 sets - 10 reps - 4-6 sec  hold - Seated Sidebending Arms Overhead  - 2 x daily - 7 x weekly - 1 sets - 3 reps - 5-10 sec  hold - Seated Trunk Rotation - Arms Crossed  - 2 x daily - 7 x weekly - 1 sets - 3 reps - 5-10 sec  hold - Supine Lower Trunk Rotation  - 2 x daily - 7 x weekly - 1 sets - 3-5 reps - 10 sec  hold - Sidelying Thoracic Rotation with Open Book  - 2 x daily - 7 x weekly - 1 sets - 5 reps - 15-20 sec  hold

## 2021-06-10 NOTE — Therapy (Signed)
[facility] [place] [address] [place], [place], [address] Phone: [phone]   Fax:  [phone]  Physical Therapy Treatment Rationale for Evaluation and Treatment Rehabilitation  Patient Details  Name: Carrie Austin MRN: [id number] Date of Birth: 02-07-1945 Referring Provider (PT): Dr Val Eagle   Encounter Date: 06/10/2021   PT End of Session - 06/10/21 1536     Visit Number 2    Number of Visits 12    Date for PT Re-Evaluation 07/15/21    PT Start Time 1532    PT Stop Time 1621    PT Time Calculation (min) 49 min    Activity Tolerance Patient tolerated treatment well             Past Medical History:  Diagnosis Date   Allergy    Anxiety    Arthritis    Cough    due allergies chronic   Depression    Dilatation of esophagus    Fibromyalgia    GERD (gastroesophageal reflux disease)    Headache(784.0)    migraines   Heart murmur    one time   Hypertension    PONV (postoperative nausea and vomiting)    hx of Vertigo after anesthesia    Past Surgical History:  Procedure Laterality Date   ABDOMINAL HYSTERECTOMY     BACK SURGERY  12/21/2012   CHOLECYSTECTOMY     COLONOSCOPY     DIAGNOSTIC LAPAROSCOPY     lap chole   EYE SURGERY Bilateral    lasik   TUBAL LIGATION      There were no vitals filed for this visit.   Subjective Assessment - 06/10/21 1537     Subjective Patient reports that the burning in the thoracic and low back stopped completely for a few days after initial treatment but then started up again.    Currently in Pain? Yes    Pain Score 7     Pain Location Back    Pain Orientation Right;Left;Posterior;Mid    Pain Descriptors / Indicators Burning;Numbness    Pain Type Acute pain;Chronic pain                               [facility] Adult PT Treatment/Exercise - 06/10/21 0001       Lumbar Exercises: Stretches   Lower Trunk Rotation 3 reps;20 seconds     Other Lumbar Stretch Exercise sidelying open book top hand resting on hip trunk rotation to pt tolerance 20 sec hold x 5 each side      Lumbar Exercises: Seated   Other Seated Lumbar Exercises lateral trunk flexion 20 - 30 sec hold x 3 reps each side    Other Seated Lumbar Exercises trunk rotation hands crossed over chest 10 sec hold x 4 reps; hands overhead for lateral trunk flexion 10 sec x 4 each side      Lumbar Exercises: Supine   Other Supine Lumbar Exercises diaphragmatic breathing 6 sec inhale/6 sec hold/6 sec exhale      Moist Heat Therapy   Number Minutes Moist Heat 10 Minutes    Moist Heat Location Lumbar Spine;Other (comment)   thoracic     Manual Therapy   Soft tissue mobilization deep tissue work through thoracic and lumbar musculature both sides of he incision into the Lt > Rt lumbar musculature and bilat scapulae    Myofascial Release thoracolumbar paraspinals    Scapular Mobilization bilat scap  mobs    Other Manual Therapy scar massage/rolling/efflurage                          PT Long Term Goals - 06/02/21 1658       PT LONG TERM GOAL #1   Title Decrease burning and pain in the thoracolumbar spine and area of the incision    Time 6    Period Weeks    Status New    Target Date 07/15/21      PT LONG TERM GOAL #2   Title Improve spinal mobilty and trunk ROM/mobilty to WFL's allowing patient to move more normally for functional activities    Time 6    Period Weeks    Status New    Target Date 07/15/21      PT LONG TERM GOAL #3   Title Patient to verbalize and demonstrate proper transfers and transitional movements for back care and proper body mechanics    Time 6    Period Weeks    Status New    Target Date 07/15/21      PT LONG TERM GOAL #4   Title Inedpendent in HEP (including aquatic therapy as indicated)    Baseline -    Time 6    Period Weeks    Status New    Target Date 07/15/21      PT LONG TERM GOAL #5   Title Improve  functional limitation score to 45    Time 6    Period Weeks    Status New    Target Date 07/15/21                   Plan - 06/10/21 1559     Clinical Impression Statement Positive response to initial treatment or manual work and stretching. Patient experienced 3 days with no buring or pain in the incision area but symptoms gradually returned. She is hesitant to try dry needling but may do so at her next visit. Continued with manual work through thoracolumbar spine into Lt > Rt lumbar (tight QL/lat/lumbar paraspinals); and bilat scapulae with mobs of scapulae on thoracic wall to stretch lats and thoracic musculature. Reviewed and added exercises to address areas of tightness.    Rehab Potential Good    PT Frequency 2x / week    PT Duration 6 weeks    PT Treatment/Interventions ADLs/Self Care Home Management;Aquatic Therapy;Cryotherapy;Electrical Stimulation;Iontophoresis /ml Dexamethasone;Moist Heat;Ultrasound;Functional mobility training;Therapeutic activities;Therapeutic exercise;Balance training;Neuromuscular re-education;Patient/family education;Manual techniques;Passive range of motion;Dry needling;Taping    PT Next Visit Plan review and progress exercises; assess response to manual work/myofacial release work; modalities and taping as indicated    PT Home Exercise Plan [email or web address]    Consulted and Agree with Plan of Care Patient             Patient will benefit from skilled therapeutic intervention in order to improve the following deficits and impairments:     Visit Diagnosis: Thoracolumbar back pain  Muscle weakness (generalized)  Abnormal posture  Chronic bilateral low back pain without sciatica     Problem List Patient Active Problem List   Diagnosis Date Noted   Kyphosis of thoracic region 12/15/2020   Spinal stenosis of lumbar region with neurogenic claudication 07/10/2020   Migraine without aura and without status migrainosus, not intractable  07/09/2019   Loss of consciousness (HCC) 07/09/2019   GERD (gastroesophageal reflux disease) 10/11/2016   Rheumatoid arthritis involving both hands (HCC) 09/27/2016  Lumbar stenosis with neurogenic claudication 05/17/2016   Impingement syndrome of right shoulder 12/01/2015   Acute superficial gastritis without hemorrhage 07/01/2015   Varicose veins of both lower extremities with pain 05/22/2015   Age-related osteoporosis without current pathological fracture 05/12/2015   Chronic pain syndrome 05/12/2015   Essential hypertension 05/12/2015   Urge incontinence 05/12/2015   Recurrent major depressive disorder, in remission (HCC) 05/12/2015   Mixed hyperlipidemia 05/12/2015   Severe episode of recurrent major depressive disorder, without psychotic features (HCC) 05/12/2015   Obstipation 04/14/2015   Irritable bowel syndrome with constipation 02/26/2015   Other allergic rhinitis 12/26/2013   Spondylolisthesis 12/21/2012   Left lumbar radiculopathy 07/05/2012   Left leg pain 07/05/2012   Left cervical radiculopathy 07/05/2012   Dysphagia 09/02/2011   GERD with stricture 06/22/2011   Stricture of esophagus 06/22/2011    Dee Paden Rober MinionP Lavonta Tillis, PT, MPH  06/10/2021, 4:18 PM  [facility] [address] [place], [place], [address] Phone: [phone]   Fax:  [phone]  Name: Carrie Austin MRN: [id number] Date of Birth: [birth year]

## 2021-06-15 ENCOUNTER — Ambulatory Visit: Payer: Medicare HMO | Attending: Neurosurgery | Admitting: Physical Therapy

## 2021-06-15 DIAGNOSIS — M6281 Muscle weakness (generalized): Secondary | ICD-10-CM | POA: Insufficient documentation

## 2021-06-15 DIAGNOSIS — G8929 Other chronic pain: Secondary | ICD-10-CM | POA: Insufficient documentation

## 2021-06-15 DIAGNOSIS — M546 Pain in thoracic spine: Secondary | ICD-10-CM | POA: Diagnosis present

## 2021-06-15 DIAGNOSIS — R293 Abnormal posture: Secondary | ICD-10-CM | POA: Insufficient documentation

## 2021-06-15 DIAGNOSIS — M545 Low back pain, unspecified: Secondary | ICD-10-CM | POA: Diagnosis present

## 2021-06-15 NOTE — Therapy (Signed)
[facility] [place] [address] [place], [place], [address] Phone: [phone]   Fax:  [phone]  Physical Therapy Treatment  Patient Details  Name: Carrie Austin MRN: [id number] Date of Birth: 1945-08-15 Referring Provider (PT): Dr Val Eagle   Encounter Date: 06/15/2021   PT End of Session - 06/15/21 1319     Visit Number 3    Number of Visits 12    Date for PT Re-Evaluation 07/15/21    PT Start Time 1319    PT Stop Time 1400    PT Time Calculation (min) 41 min    Activity Tolerance Patient tolerated treatment well             Past Medical History:  Diagnosis Date   Allergy    Anxiety    Arthritis    Cough    due allergies chronic   Depression    Dilatation of esophagus    Fibromyalgia    GERD (gastroesophageal reflux disease)    Headache(784.0)    migraines   Heart murmur    one time   Hypertension    PONV (postoperative nausea and vomiting)    hx of Vertigo after anesthesia    Past Surgical History:  Procedure Laterality Date   ABDOMINAL HYSTERECTOMY     BACK SURGERY  12/21/2012   CHOLECYSTECTOMY     COLONOSCOPY     DIAGNOSTIC LAPAROSCOPY     lap chole   EYE SURGERY Bilateral    lasik   TUBAL LIGATION      There were no vitals filed for this visit.   Subjective Assessment - 06/15/21 1321     Subjective Pain continues to improve. Exercises going well. Agreeable to trial TPDN this session.    Pertinent History LBP for ~ 25 years, first back surgery 2014 followed by another back surgery and cervical surgery; arthritis; HTN; knee pain; fall 2008 from bed    Currently in Pain? Yes    Pain Score 5     Pain Location Back    Pain Orientation Right;Left;Posterior;Mid    Pain Type Acute pain;Chronic pain    Pain Onset More than a month ago                [facility] PT Assessment - 06/15/21 0001       Assessment   Medical Diagnosis Lumbothoracic pain    Referring Provider (PT) Dr  Val Eagle    Onset Date/Surgical Date 12/15/20                           [facility] Adult PT Treatment/Exercise - 06/15/21 0001       Lumbar Exercises: Stretches   Lower Trunk Rotation 3 reps;20 seconds    Other Lumbar Stretch Exercise sidelying open book top hand resting on hip trunk rotation to pt tolerance 15 sec hold x 5 each side      Lumbar Exercises: Standing   Other Standing Lumbar Exercises Modified plank on plinth with alternating UE flexion x10; modified plank with alternating LE extension x10    Other Standing Lumbar Exercises Modified plank on plinth 2x30 sec      Lumbar Exercises: Seated   Other Seated Lumbar Exercises lateral trunk flexion 20 - 30 sec hold x 2 reps each side    Other Seated Lumbar Exercises trunk rotation hands crossed over chest 10 sec hold x 4 reps      Manual Therapy  Manual therapy comments skilled assessment and palpation for TPDN    Soft tissue mobilization deep tissue work through thoracic and lumbar musculature both sides of he incision into the Lt > Rt lumbar musculature and bilat scapulae    Myofascial Release thoracolumbar paraspinals              Trigger Point Dry Needling - 06/15/21 0001     Consent Given? Yes    Education Handout Provided Previously provided                        PT Long Term Goals - 06/02/21 1658       PT LONG TERM GOAL #1   Title Decrease burning and pain in the thoracolumbar spine and area of the incision    Time 6    Period Weeks    Status New    Target Date 07/15/21      PT LONG TERM GOAL #2   Title Improve spinal mobilty and trunk ROM/mobilty to WFL's allowing patient to move more normally for functional activities    Time 6    Period Weeks    Status New    Target Date 07/15/21      PT LONG TERM GOAL #3   Title Patient to verbalize and demonstrate proper transfers and transitional movements for back care and proper body mechanics    Time 6    Period Weeks     Status New    Target Date 07/15/21      PT LONG TERM GOAL #4   Title Inedpendent in HEP (including aquatic therapy as indicated)    Baseline -    Time 6    Period Weeks    Status New    Target Date 07/15/21      PT LONG TERM GOAL #5   Title Improve functional limitation score to 45    Time 6    Period Weeks    Status New    Target Date 07/15/21                   Plan - 06/15/21 1401     Clinical Impression Statement Trialed TPDN this session along thoracolumbar muscles. Provided gentle manual work to address. Continued to work on core strengthening and lumbar/thoracic mobility.    Rehab Potential Good    PT Frequency 2x / week    PT Duration 6 weeks    PT Treatment/Interventions ADLs/Self Care Home Management;Aquatic Therapy;Cryotherapy;Electrical Stimulation;Iontophoresis 4mg /ml Dexamethasone;Moist Heat;Ultrasound;Functional mobility training;Therapeutic activities;Therapeutic exercise;Balance training;Neuromuscular re-education;Patient/family education;Manual techniques;Passive range of motion;Dry needling;Taping    PT Next Visit Plan review and progress exercises; assess response to manual work/myofacial release work; modalities and taping as indicated    PT Home Exercise Plan [email or web address]    Consulted and Agree with Plan of Care Patient             Patient will benefit from skilled therapeutic intervention in order to improve the following deficits and impairments:  Decreased range of motion, Decreased activity tolerance, Pain, Hypomobility, Impaired flexibility, Improper body mechanics, Decreased mobility, Decreased strength, Increased edema, Impaired sensation, Postural dysfunction  Visit Diagnosis: Thoracolumbar back pain  Muscle weakness (generalized)  Abnormal posture  Chronic bilateral low back pain without sciatica     Problem List Patient Active Problem List   Diagnosis Date Noted   Kyphosis of thoracic region 12/15/2020   Spinal stenosis of  lumbar region with neurogenic claudication 07/10/2020   Migraine without  aura and without status migrainosus, not intractable 07/09/2019   Loss of consciousness (HCC) 07/09/2019   GERD (gastroesophageal reflux disease) 10/11/2016   Rheumatoid arthritis involving both hands (HCC) 09/27/2016   Lumbar stenosis with neurogenic claudication 05/17/2016   Impingement syndrome of right shoulder 12/01/2015   Acute superficial gastritis without hemorrhage 07/01/2015   Varicose veins of both lower extremities with pain 05/22/2015   Age-related osteoporosis without current pathological fracture 05/12/2015   Chronic pain syndrome 05/12/2015   Essential hypertension 05/12/2015   Urge incontinence 05/12/2015   Recurrent major depressive disorder, in remission (HCC) 05/12/2015   Mixed hyperlipidemia 05/12/2015   Severe episode of recurrent major depressive disorder, without psychotic features (HCC) 05/12/2015   Obstipation 04/14/2015   Irritable bowel syndrome with constipation 02/26/2015   Other allergic rhinitis 12/26/2013   Spondylolisthesis 12/21/2012   Left lumbar radiculopathy 07/05/2012   Left leg pain 07/05/2012   Left cervical radiculopathy 07/05/2012   Dysphagia 09/02/2011   GERD with stricture 06/22/2011   Stricture of esophagus 06/22/2011    [facility] SuitesGellen April Ma L PierceNonato, PT, DPT 06/15/2021, 2:02 PM  [facility] [address] [place], [place], [address] Phone: [phone]   Fax:  [phone]  Name: Carrie Austin MRN: [id number] Date of Birth: 03-20-1945

## 2021-06-18 ENCOUNTER — Ambulatory Visit: Payer: Medicare HMO | Admitting: Physical Therapy

## 2021-06-18 DIAGNOSIS — R293 Abnormal posture: Secondary | ICD-10-CM

## 2021-06-18 DIAGNOSIS — M545 Low back pain, unspecified: Secondary | ICD-10-CM

## 2021-06-18 DIAGNOSIS — M6281 Muscle weakness (generalized): Secondary | ICD-10-CM

## 2021-06-18 NOTE — Therapy (Signed)
[facility] [place] [address], [place], [address] Phone: [phone]   Fax:  [phone]  Physical Therapy Treatment  Patient Details  Name: MAEGHEN GENT MRN: [id number] Date of Birth: 08-17-1945 Referring Provider (PT): Dr Val Eagle   Encounter Date: 06/18/2021   PT End of Session - 06/18/21 1409     Visit Number 4    Number of Visits 12    Date for PT Re-Evaluation 07/15/21    PT Start Time 1409    PT Stop Time 1448    PT Time Calculation (min) 39 min    Activity Tolerance Patient tolerated treatment well    Behavior During Therapy [facility] for tasks assessed/performed             Past Medical History:  Diagnosis Date   Allergy    Anxiety    Arthritis    Cough    due allergies chronic   Depression    Dilatation of esophagus    Fibromyalgia    GERD (gastroesophageal reflux disease)    Headache(784.0)    migraines   Heart murmur    one time   Hypertension    PONV (postoperative nausea and vomiting)    hx of Vertigo after anesthesia    Past Surgical History:  Procedure Laterality Date   ABDOMINAL HYSTERECTOMY     BACK SURGERY  12/21/2012   CHOLECYSTECTOMY     COLONOSCOPY     DIAGNOSTIC LAPAROSCOPY     lap chole   EYE SURGERY Bilateral    lasik   TUBAL LIGATION      There were no vitals filed for this visit.   Subjective Assessment - 06/18/21 1412     Subjective Pt feels TPDN was helpful. Not feeling too sore.    Pertinent History LBP for ~ 25 years, first back surgery 2014 followed by another back surgery and cervical surgery; arthritis; HTN; knee pain; fall 2008 from bed    Currently in Pain? Yes    Pain Score 4     Pain Location Back    Pain Onset More than a month ago                [facility] PT Assessment - 06/18/21 0001       Assessment   Medical Diagnosis Lumbothoracic pain    Referring Provider (PT) Dr Val Eagle    Onset Date/Surgical Date 12/15/20                            [facility] Adult PT Treatment/Exercise - 06/18/21 0001       Lumbar Exercises: Stretches   Lower Trunk Rotation 5 reps;10 seconds    Other Lumbar Stretch Exercise sidelying open book top hand resting on hip trunk rotation to pt tolerance 15 sec hold x 5 each side    Other Lumbar Stretch Exercise pball forward flexion 5x10 sec; with lateral flexion 5x10 sec L&R      Lumbar Exercises: Standing   Other Standing Lumbar Exercises Modified plank on plinth with alternating UE flexion/LE extension 5x5 sec hold; palloff press x10 green tband    Other Standing Lumbar Exercises Modified plank on plinth 2x30 sec      Manual Therapy   Manual therapy comments skilled assessment and palpation for TPDN    Soft tissue mobilization deep tissue work through thoracic and lumbar musculature both sides of he incision into the Lt > Rt lumbar  musculature    Myofascial Release thoracolumbar paraspinals              Trigger Point Dry Needling - 06/18/21 0001     Consent Given? Yes    Education Handout Provided Previously provided    Muscles Treated Back/Hip Erector spinae;Lumbar multifidi;Iliacus    Iliacus Response Palpable increased muscle length    Erector spinae Response Twitch response elicited;Palpable increased muscle length    Lumbar multifidi Response Palpable increased muscle length                        PT Long Term Goals - 06/02/21 1658       PT LONG TERM GOAL #1   Title Decrease burning and pain in the thoracolumbar spine and area of the incision    Time 6    Period Weeks    Status New    Target Date 07/15/21      PT LONG TERM GOAL #2   Title Improve spinal mobilty and trunk ROM/mobilty to WFL's allowing patient to move more normally for functional activities    Time 6    Period Weeks    Status New    Target Date 07/15/21      PT LONG TERM GOAL #3   Title Patient to verbalize and demonstrate proper transfers and transitional  movements for back care and proper body mechanics    Time 6    Period Weeks    Status New    Target Date 07/15/21      PT LONG TERM GOAL #4   Title Inedpendent in HEP (including aquatic therapy as indicated)    Baseline -    Time 6    Period Weeks    Status New    Target Date 07/15/21      PT LONG TERM GOAL #5   Title Improve functional limitation score to 45    Time 6    Period Weeks    Status New    Target Date 07/15/21                   Plan - 06/18/21 1447     Clinical Impression Statement Pt reports feeling good improvements after TPDN. Continued to perform TPDN for thoracic/lumbar paraspinals and manual work. Progressing pt's core and trunk stability exercises.    Rehab Potential Good    PT Frequency 2x / week    PT Duration 6 weeks    PT Treatment/Interventions ADLs/Self Care Home Management;Aquatic Therapy;Cryotherapy;Electrical Stimulation;Iontophoresis /ml Dexamethasone;Moist Heat;Ultrasound;Functional mobility training;Therapeutic activities;Therapeutic exercise;Balance training;Neuromuscular re-education;Patient/family education;Manual techniques;Passive range of motion;Dry needling;Taping    PT Next Visit Plan review and progress exercises; assess response to manual work/myofacial release work; modalities and taping as indicated    PT Home Exercise Plan [email or web address]    Consulted and Agree with Plan of Care Patient             Patient will benefit from skilled therapeutic intervention in order to improve the following deficits and impairments:  Decreased range of motion, Decreased activity tolerance, Pain, Hypomobility, Impaired flexibility, Improper body mechanics, Decreased mobility, Decreased strength, Increased edema, Impaired sensation, Postural dysfunction  Visit Diagnosis: Thoracolumbar back pain  Muscle weakness (generalized)  Abnormal posture  Chronic bilateral low back pain without sciatica     Problem List Patient Active Problem  List   Diagnosis Date Noted   Kyphosis of thoracic region 12/15/2020   Spinal stenosis of lumbar region with neurogenic claudication  07/10/2020   Migraine without aura and without status migrainosus, not intractable 07/09/2019   Loss of consciousness (HCC) 07/09/2019   GERD (gastroesophageal reflux disease) 10/11/2016   Rheumatoid arthritis involving both hands (HCC) 09/27/2016   Lumbar stenosis with neurogenic claudication 05/17/2016   Impingement syndrome of right shoulder 12/01/2015   Acute superficial gastritis without hemorrhage 07/01/2015   Varicose veins of both lower extremities with pain 05/22/2015   Age-related osteoporosis without current pathological fracture 05/12/2015   Chronic pain syndrome 05/12/2015   Essential hypertension 05/12/2015   Urge incontinence 05/12/2015   Recurrent major depressive disorder, in remission (HCC) 05/12/2015   Mixed hyperlipidemia 05/12/2015   Severe episode of recurrent major depressive disorder, without psychotic features (HCC) 05/12/2015   Obstipation 04/14/2015   Irritable bowel syndrome with constipation 02/26/2015   Other allergic rhinitis 12/26/2013   Spondylolisthesis 12/21/2012   Left lumbar radiculopathy 07/05/2012   Left leg pain 07/05/2012   Left cervical radiculopathy 07/05/2012   Dysphagia 09/02/2011   GERD with stricture 06/22/2011   Stricture of esophagus 06/22/2011    [facility] April Dell PontoMa L Larae Caison, [place], DPT 06/18/2021, 2:49 PM  [facility] [address] [place], [place], [address] Phone: [phone]   Fax:  [phone]  Name: Jeral PinchLinda M Assefa MRN: [id number] Date of Birth: [birth year]

## 2021-06-22 ENCOUNTER — Ambulatory Visit: Payer: Medicare HMO | Admitting: Rehabilitative and Restorative Service Providers"

## 2021-06-22 ENCOUNTER — Encounter: Payer: Self-pay | Admitting: Rehabilitative and Restorative Service Providers"

## 2021-06-22 DIAGNOSIS — M545 Low back pain, unspecified: Secondary | ICD-10-CM | POA: Diagnosis not present

## 2021-06-22 DIAGNOSIS — R293 Abnormal posture: Secondary | ICD-10-CM

## 2021-06-22 DIAGNOSIS — M546 Pain in thoracic spine: Secondary | ICD-10-CM

## 2021-06-22 DIAGNOSIS — M6281 Muscle weakness (generalized): Secondary | ICD-10-CM

## 2021-06-22 NOTE — Therapy (Signed)
[facility]  [place], [place], [address] Phone: [phone]   Fax:  [phone]  Physical Therapy Treatment Rationale for Evaluation and Treatment Rehabilitation  Patient Details  Name: Carrie Austin MRN: [id number] Date of Birth: Jun 07, 1945 Referring Provider (PT): Dr Viona Gilmore   Encounter Date: 06/22/2021   PT End of Session - 06/22/21 1319     Visit Number 5    Number of Visits 12    Date for PT Re-Evaluation 07/15/21    PT Start Time 1318    PT Stop Time 1406    PT Time Calculation (min) 48 min    Activity Tolerance Patient tolerated treatment well             Past Medical History:  Diagnosis Date   Allergy    Anxiety    Arthritis    Cough    due allergies chronic   Depression    Dilatation of esophagus    Fibromyalgia    GERD (gastroesophageal reflux disease)    Headache(784.0)    migraines   Heart murmur    one time   Hypertension    PONV (postoperative nausea and vomiting)    hx of Vertigo after anesthesia    Past Surgical History:  Procedure Laterality Date   ABDOMINAL HYSTERECTOMY     BACK SURGERY  12/21/2012   CHOLECYSTECTOMY     COLONOSCOPY     DIAGNOSTIC LAPAROSCOPY     lap chole   EYE SURGERY Bilateral    lasik   TUBAL LIGATION      There were no vitals filed for this visit.   Subjective Assessment - 06/22/21 1319     Subjective Sometimes better, sometimes not. Still having too much pain. The manual work helps the most. Stepped off a curb Saturday and jarred her back. Was in pain the rest of the day. Burning pain has gotten less overall.    Currently in Pain? Yes    Pain Score 6     Pain Location Back    Pain Orientation Right;Left;Posterior;Mid    Pain Descriptors / Indicators Burning    Pain Type Acute pain;Chronic pain    Pain Onset More than a month ago    Pain Frequency Constant                               [facility] Adult PT  Treatment/Exercise - 06/22/21 0001       Lumbar Exercises: Stretches   Lower Trunk Rotation 5 reps;10 seconds    Other Lumbar Stretch Exercise sidelying open book top hand resting on hip trunk rotation to pt tolerance 15 sec hold x 5 each side    Other Lumbar Stretch Exercise cat cow x 5 reps x 10 sec hold; child's pose 30 sec x 3 reps; sitting forward flexion 30 sec x 3 reps      Lumbar Exercises: Seated   Sit to Stand Limitations sitting forward trunk flexion 30 sec x 3    Other Seated Lumbar Exercises lateral trunk flexion 20 - 30 sec hold x 2 reps each side    Other Seated Lumbar Exercises trunk rotation hands crossed over chest 10 sec hold x 4 reps      Lumbar Exercises: Supine   Other Supine Lumbar Exercises diaphragmatic breathing 6 sec inhale/6 sec hold/6 sec exhale      Manual Therapy   Manual therapy comments skilled  assessment and palpation for TPDN    Soft tissue mobilization deep tissue work through thoracic and lumbar musculature both sides of he incision into the Lt > Rt lumbar musculature    Myofascial Release thoracolumbar paraspinals    Scapular Mobilization bilat scap mobs    Other Manual Therapy scar massage/rolling/efflurage              Trigger Point Dry Needling - 06/22/21 0001     Consent Given? Yes    Education Handout Provided Previously provided    Printmaker Performed with Dry Needling Yes    E-stim with Dry Needling Details mApm x 5 min to tolerance    Other Dry Needling scar threading x 3 needles mid scar in area of tightness    Upper Trapezius Response Palpable increased muscle length   painful - not well tolerated   Erector spinae Response Twitch response elicited;Palpable increased muscle length    Lumbar multifidi Response Palpable increased muscle length;Twitch response elicited    Thoracic multifidi response Palpable increased muscle length;Twitch response elicited                   PT Education - 06/22/21 1402      Education Details HEP    Person(s) Educated Patient    Methods Explanation;Demonstration;Tactile cues;Verbal cues;Handout    Comprehension Verbalized understanding;Returned demonstration;Verbal cues required;Tactile cues required                 PT Long Term Goals - 06/02/21 1658       PT LONG TERM GOAL #1   Title Decrease burning and pain in the thoracolumbar spine and area of the incision    Time 6    Period Weeks    Status New    Target Date 07/15/21      PT LONG TERM GOAL #2   Title Improve spinal mobilty and trunk ROM/mobilty to WFL's allowing patient to move more normally for functional activities    Time 6    Period Weeks    Status New    Target Date 07/15/21      PT LONG TERM GOAL #3   Title Patient to verbalize and demonstrate proper transfers and transitional movements for back care and proper body mechanics    Time 6    Period Weeks    Status New    Target Date 07/15/21      PT LONG TERM GOAL #4   Title Inedpendent in HEP (including aquatic therapy as indicated)    Baseline -    Time 6    Period Weeks    Status New    Target Date 07/15/21      PT LONG TERM GOAL #5   Title Improve functional limitation score to 45    Time 6    Period Weeks    Status New    Target Date 07/15/21                   Plan - 06/22/21 1359     Clinical Impression Statement Some improvement overall. Remains painful with intermittent burning in the area of the spinal incision but the frequency of the buring has decreased. Patient tolerated Dn and manual work well adding trial of estim through the needles. Added stretching on hands and knees and in sitting. note increased mobility and range with stretches.    Rehab Potential Good    PT Frequency 2x / week    PT Duration 6 weeks  PT Treatment/Interventions ADLs/Self Care Home Management;Aquatic Therapy;Cryotherapy;Electrical Stimulation;Iontophoresis 4mg /ml Dexamethasone;Moist Heat;Ultrasound;Functional mobility  training;Therapeutic activities;Therapeutic exercise;Balance training;Neuromuscular re-education;Patient/family education;Manual techniques;Passive range of motion;Dry needling;Taping    PT Next Visit Plan review and progress exercises; assess response to manual work/myofacial release work; modalities and taping as indicated    PT [place] and Agree with Plan of Care Patient             Patient will benefit from skilled therapeutic intervention in order to improve the following deficits and impairments:     Visit Diagnosis: Thoracolumbar back pain  Muscle weakness (generalized)  Abnormal posture  Chronic bilateral low back pain without sciatica     Problem List Patient Active Problem List   Diagnosis Date Noted   Kyphosis of thoracic region 12/15/2020   Spinal stenosis of lumbar region with neurogenic claudication 07/10/2020   Migraine without aura and without status migrainosus, not intractable 07/09/2019   Loss of consciousness ([place]) 07/09/2019   GERD (gastroesophageal reflux disease) 10/11/2016   Rheumatoid arthritis involving both hands ([place]) 09/27/2016   Lumbar stenosis with neurogenic claudication 05/17/2016   Impingement syndrome of right shoulder 12/01/2015   Acute superficial gastritis without hemorrhage 07/01/2015   Varicose veins of both lower extremities with pain 05/22/2015   Age-related osteoporosis without current pathological fracture 05/12/2015   Chronic pain syndrome 05/12/2015   Essential hypertension 05/12/2015   Urge incontinence 05/12/2015   Recurrent major depressive disorder, in remission ([place]) 05/12/2015   Mixed hyperlipidemia 05/12/2015   Severe episode of recurrent major depressive disorder, without psychotic features ([place]) 05/12/2015   Obstipation 04/14/2015   Irritable bowel syndrome with constipation 02/26/2015   Other allergic rhinitis 12/26/2013   Spondylolisthesis 12/21/2012   Left lumbar radiculopathy  07/05/2012   Left leg pain 07/05/2012   Left cervical radiculopathy 07/05/2012   Dysphagia 09/02/2011   GERD with stricture 06/22/2011   Stricture of esophagus 06/22/2011    [place] Weekly Nilda Simmer, PT,MPH  06/22/2021, 2:11 PM  [facility] [place], [place], [address] Phone: [phone]   Fax:  [phone]  Name: Carrie Austin MRN: [id number] Date of Birth: 16-May-1945

## 2021-06-22 NOTE — Patient Instructions (Signed)
Access Code: [email or web address] URL: [email or web address] Date: 06/22/2021 Prepared by: Gillermo Murdoch  Exercises - Supine Diaphragmatic Breathing  - 2 x daily - 7 x weekly - 1 sets - 10 reps - 4-6 sec  hold - Seated Sidebending Arms Overhead  - 2 x daily - 7 x weekly - 1 sets - 3 reps - 5-10 sec  hold - Seated Trunk Rotation - Arms Crossed  - 2 x daily - 7 x weekly - 1 sets - 3 reps - 5-10 sec  hold - Supine Lower Trunk Rotation  - 2 x daily - 7 x weekly - 1 sets - 3-5 reps - 10 sec  hold - Sidelying Thoracic Rotation with Open Book  - 2 x daily - 7 x weekly - 1 sets - 5 reps - 15-20 sec  hold - Forearm Plank on Wall  - 1 x daily - 7 x weekly - 3 sets - 30 sec hold - Standing Anti-Rotation Press with Anchored Resistance  - 1 x daily - 7 x weekly - 2 sets - 10 reps - 3 sec hold - Cat Cow  - 2 x daily - 7 x weekly - 1 sets - 5-10 reps - 3-5 sec  hold - Cat Cow to Child's Pose  - 2 x daily - 7 x weekly - 1 sets - 3 reps - 30 sec  hold - Seated Forward Bending  - 2 x daily - 7 x weekly - 1 sets - 3 reps - 30 sec  hold

## 2021-06-24 ENCOUNTER — Encounter: Payer: Self-pay | Admitting: Rehabilitative and Restorative Service Providers"

## 2021-06-29 ENCOUNTER — Encounter: Payer: Self-pay | Admitting: Physical Therapy

## 2021-06-29 ENCOUNTER — Ambulatory Visit: Payer: Medicare HMO | Admitting: Physical Therapy

## 2021-06-29 DIAGNOSIS — M545 Low back pain, unspecified: Secondary | ICD-10-CM | POA: Diagnosis not present

## 2021-06-29 DIAGNOSIS — G8929 Other chronic pain: Secondary | ICD-10-CM

## 2021-06-29 DIAGNOSIS — R293 Abnormal posture: Secondary | ICD-10-CM

## 2021-06-29 DIAGNOSIS — M546 Pain in thoracic spine: Secondary | ICD-10-CM

## 2021-06-29 DIAGNOSIS — M6281 Muscle weakness (generalized): Secondary | ICD-10-CM

## 2021-06-29 NOTE — Therapy (Signed)
[facility] [place] [address] [place], [place], [address] Phone: [phone]   Fax:  [phone]  Physical Therapy Treatment  Patient Details  Name: Carrie Austin MRN: [id number] Date of Birth: 05/08/1945 Referring Provider (PT): Dr Val Eagle  Rationale for Evaluation and Treatment Rehabilitation  Encounter Date: 06/29/2021   PT End of Session - 06/29/21 1324     Visit Number 6    Number of Visits 12    Date for PT Re-Evaluation 07/15/21    PT Start Time 1325    PT Stop Time 1410    PT Time Calculation (min) 45 min    Activity Tolerance Patient tolerated treatment well    Behavior During Therapy [facility] for tasks assessed/performed             Past Medical History:  Diagnosis Date   Allergy    Anxiety    Arthritis    Cough    due allergies chronic   Depression    Dilatation of esophagus    Fibromyalgia    GERD (gastroesophageal reflux disease)    Headache(784.0)    migraines   Heart murmur    one time   Hypertension    PONV (postoperative nausea and vomiting)    hx of Vertigo after anesthesia    Past Surgical History:  Procedure Laterality Date   ABDOMINAL HYSTERECTOMY     BACK SURGERY  12/21/2012   CHOLECYSTECTOMY     COLONOSCOPY     DIAGNOSTIC LAPAROSCOPY     lap chole   EYE SURGERY Bilateral    lasik   TUBAL LIGATION      There were no vitals filed for this visit.   Subjective Assessment - 06/29/21 1328     Subjective Pt states she took a quick trip to [place]. Wedding was fine but tough on her back.    Pertinent History LBP for ~ 25 years, first back surgery 2014 followed by another back surgery and cervical surgery; arthritis; HTN; knee pain; fall 2008 from bed    Patient Stated Goals decreased the back pain    Currently in Pain? Yes    Pain Score 7     Pain Location Back    Pain Orientation Left    Pain Descriptors / Indicators Burning    Pain Type Chronic pain    Pain  Onset More than a month ago                [facility] PT Assessment - 06/29/21 0001       Assessment   Medical Diagnosis Lumbothoracic pain    Referring Provider (PT) Dr Val Eagle    Onset Date/Surgical Date 12/15/20                           [facility] Adult PT Treatment/Exercise - 06/29/21 0001       Lumbar Exercises: Stretches   Hip Flexor Stretch Right;Left;30 seconds;2 reps    Figure 4 Stretch 2 reps;30 seconds;Seated;With overpressure    Other Lumbar Stretch Exercise feet on pball trunk rotations with deep breathing 2x30 sec L&R    Other Lumbar Stretch Exercise Feet on pball flexion/extension with deep breathing x10      Lumbar Exercises: Prone   Straight Leg Raise 10 reps    Other Prone Lumbar Exercises knee flexed glute set 2x10    Other Prone Lumbar Exercises "I", "Y", "T" 2x10 R & L  Manual Therapy   Soft tissue mobilization deep tissue work through thoracic and lumbar musculature both sides of he incision into the Lt > Rt lumbar musculature    Myofascial Release thoracolumbar paraspinals                          PT Long Term Goals - 06/02/21 1658       PT LONG TERM GOAL #1   Title Decrease burning and pain in the thoracolumbar spine and area of the incision    Time 6    Period Weeks    Status New    Target Date 07/15/21      PT LONG TERM GOAL #2   Title Improve spinal mobilty and trunk ROM/mobilty to WFL's allowing patient to move more normally for functional activities    Time 6    Period Weeks    Status New    Target Date 07/15/21      PT LONG TERM GOAL #3   Title Patient to verbalize and demonstrate proper transfers and transitional movements for back care and proper body mechanics    Time 6    Period Weeks    Status New    Target Date 07/15/21      PT LONG TERM GOAL #4   Title Inedpendent in HEP (including aquatic therapy as indicated)    Baseline -    Time 6    Period Weeks    Status New    Target Date  07/15/21      PT LONG TERM GOAL #5   Title Improve functional limitation score to 45    Time 6    Period Weeks    Status New    Target Date 07/15/21                   Plan - 06/29/21 1412     Clinical Impression Statement Pt with pain exacerbated after prolonged driving. Continued to provide manual work through thoracolumbar paraspinals. Defers TPDN today. Working on improving glute activation without utilizing lumbar paraspinals. Continued core/hip strengthening and stretching as able.    Rehab Potential Good    PT Frequency 2x / week    PT Duration 6 weeks    PT Treatment/Interventions ADLs/Self Care Home Management;Aquatic Therapy;Cryotherapy;Electrical Stimulation;Iontophoresis 4mg /ml Dexamethasone;Moist Heat;Ultrasound;Functional mobility training;Therapeutic activities;Therapeutic exercise;Balance training;Neuromuscular re-education;Patient/family education;Manual techniques;Passive range of motion;Dry needling;Taping    PT Next Visit Plan review and progress exercises; assess response to manual work/myofacial release work; modalities and taping as indicated    PT Home Exercise Plan [email or web address]    Consulted and Agree with Plan of Care Patient             Patient will benefit from skilled therapeutic intervention in order to improve the following deficits and impairments:  Decreased range of motion, Decreased activity tolerance, Pain, Hypomobility, Impaired flexibility, Improper body mechanics, Decreased mobility, Decreased strength, Increased edema, Impaired sensation, Postural dysfunction  Visit Diagnosis: Thoracolumbar back pain  Muscle weakness (generalized)  Abnormal posture  Chronic bilateral low back pain without sciatica     Problem List Patient Active Problem List   Diagnosis Date Noted   Kyphosis of thoracic region 12/15/2020   Spinal stenosis of lumbar region with neurogenic claudication 07/10/2020   Migraine without aura and without status  migrainosus, not intractable 07/09/2019   Loss of consciousness (HCC) 07/09/2019   GERD (gastroesophageal reflux disease) 10/11/2016   Rheumatoid arthritis involving both hands (HCC) 09/27/2016  Lumbar stenosis with neurogenic claudication 05/17/2016   Impingement syndrome of right shoulder 12/01/2015   Acute superficial gastritis without hemorrhage 07/01/2015   Varicose veins of both lower extremities with pain 05/22/2015   Age-related osteoporosis without current pathological fracture 05/12/2015   Chronic pain syndrome 05/12/2015   Essential hypertension 05/12/2015   Urge incontinence 05/12/2015   Recurrent major depressive disorder, in remission (HCC) 05/12/2015   Mixed hyperlipidemia 05/12/2015   Severe episode of recurrent major depressive disorder, without psychotic features (HCC) 05/12/2015   Obstipation 04/14/2015   Irritable bowel syndrome with constipation 02/26/2015   Other allergic rhinitis 12/26/2013   Spondylolisthesis 12/21/2012   Left lumbar radiculopathy 07/05/2012   Left leg pain 07/05/2012   Left cervical radiculopathy 07/05/2012   Dysphagia 09/02/2011   GERD with stricture 06/22/2011   Stricture of esophagus 06/22/2011    [facility] April Ma L [place], PT, DPT 06/29/2021, 3:00 PM  [facility] [address] [place], [place], [address] Phone: [phone]   Fax:  [phone]  Name: Carrie Austin MRN: [id number] Date of Birth: Feb 06, 1945

## 2021-07-02 ENCOUNTER — Ambulatory Visit: Payer: Medicare HMO | Admitting: Physical Therapy

## 2021-07-02 DIAGNOSIS — M545 Low back pain, unspecified: Secondary | ICD-10-CM | POA: Diagnosis not present

## 2021-07-02 DIAGNOSIS — G8929 Other chronic pain: Secondary | ICD-10-CM

## 2021-07-02 DIAGNOSIS — R293 Abnormal posture: Secondary | ICD-10-CM

## 2021-07-02 DIAGNOSIS — M546 Pain in thoracic spine: Secondary | ICD-10-CM

## 2021-07-02 DIAGNOSIS — M6281 Muscle weakness (generalized): Secondary | ICD-10-CM

## 2021-07-02 NOTE — Therapy (Addendum)
[place], [place], [address] Phone: [phone]   Fax:  [phone]  Physical Therapy Treatment and Discharge  Patient Details  Name: Carrie Austin MRN: [id number] Date of Birth: July 24, 1945 Referring Provider (PT): Dr Viona Gilmore  PHYSICAL THERAPY DISCHARGE SUMMARY  Visits from Start of Care: 7  Current functional level related to goals / functional outcomes: See below   Remaining deficits: See below   Education / Equipment: See below   Patient agrees to discharge. Patient goals were not met. Patient is being discharged due to lack of progress.   Encounter Date: 07/02/2021   PT End of Session - 07/02/21 1533     Visit Number 7    Number of Visits 12    Date for PT Re-Evaluation 07/15/21    PT Start Time 3846    PT Stop Time 1615    PT Time Calculation (min) 42 min    Activity Tolerance Patient tolerated treatment well    Behavior During Therapy WFL for tasks assessed/performed             Past Medical History:  Diagnosis Date   Allergy    Anxiety    Arthritis    Cough    due allergies chronic   Depression    Dilatation of esophagus    Fibromyalgia    GERD (gastroesophageal reflux disease)    Headache(784.0)    migraines   Heart murmur    one time   Hypertension    PONV (postoperative nausea and vomiting)    hx of Vertigo after anesthesia    Past Surgical History:  Procedure Laterality Date   ABDOMINAL HYSTERECTOMY     BACK SURGERY  12/21/2012   CHOLECYSTECTOMY     COLONOSCOPY     DIAGNOSTIC LAPAROSCOPY     lap chole   EYE SURGERY Bilateral    lasik   TUBAL LIGATION      There were no vitals filed for this visit.   Subjective Assessment - 07/02/21 1535     Subjective Pt reports she is feeling "so so" today. Still felt burning pain after last session. Has been trying to keep up exercises at home. Has not been doing self massage with tennis ball.     Pertinent History LBP for ~ 25 years, first back surgery 2014 followed by another back surgery and cervical surgery; arthritis; HTN; knee pain; fall 2008 from bed    Patient Stated Goals decreased the back pain    Currently in Pain? Yes    Pain Score 7     Pain Location Back    Pain Orientation Left    Pain Descriptors / Indicators Burning    Pain Type Chronic pain    Pain Onset More than a month ago                [facility] PT Assessment - 07/02/21 0001       Assessment   Medical Diagnosis Lumbothoracic pain    Referring Provider (PT) Dr Viona Gilmore    Onset Date/Surgical Date 12/15/20                           [facility] Adult PT Treatment/Exercise - 07/02/21 0001       Lumbar Exercises: Stretches   Other Lumbar Stretch Exercise sidelying open/close book x10    Other Lumbar Stretch Exercise "barrell hug" into trunk flexion x5, with thoracic rotation  2x5; thoracic extensions against pool noodle segment above, at and below point of pain x5 each      Lumbar Exercises: Aerobic   Tread Mill 1.2 mph x 5 min      Lumbar Exercises: Standing   Shoulder Extension Strengthening;Both;20 reps;Theraband    Theraband Level (Shoulder Extension) Level 2 (Red)    Other Standing Lumbar Exercises high row red tband 2x10      Lumbar Exercises: Supine   Other Supine Lumbar Exercises serratus punch 2x10 1# weight      Lumbar Exercises: Sidelying   Other Sidelying Lumbar Exercises bow/arrow x10      Lumbar Exercises: Prone   Straight Leg Raise 10 reps    Opposite Arm/Leg Raise Right arm/Left leg;Left arm/Right leg;20 reps      Manual Therapy   Soft tissue mobilization deep tissue work through thoracic and lumbar musculature both sides of he incision into the Lt > Rt lumbar musculature    Myofascial Release thoracolumbar paraspinals                          PT Long Term Goals - 06/02/21 1658       PT LONG TERM GOAL #1   Title Decrease burning and pain  in the thoracolumbar spine and area of the incision    Time 6    Period Weeks    Status New    Target Date 07/15/21      PT LONG TERM GOAL #2   Title Improve spinal mobilty and trunk ROM/mobilty to WFL's allowing patient to move more normally for functional activities    Time 6    Period Weeks    Status New    Target Date 07/15/21      PT LONG TERM GOAL #3   Title Patient to verbalize and demonstrate proper transfers and transitional movements for back care and proper body mechanics    Time 6    Period Weeks    Status New    Target Date 07/15/21      PT LONG TERM GOAL #4   Title Inedpendent in HEP (including aquatic therapy as indicated)    Baseline -    Time 6    Period Weeks    Status New    Target Date 07/15/21      PT LONG TERM GOAL #5   Title Improve functional limitation score to 45    Time 6    Period Weeks    Status New    Target Date 07/15/21                   Plan - 07/02/21 1614     Clinical Impression Statement Pt unable to tell if new exercises are helping. Currently feeling sore in midback. Continued lumbar and thoracic stabilization exercises and gentle stretching.    Rehab Potential Good    PT Frequency 2x / week    PT Duration 6 weeks    PT Treatment/Interventions ADLs/Self Care Home Management;Aquatic Therapy;Cryotherapy;Electrical Stimulation;Iontophoresis 57m/ml Dexamethasone;Moist Heat;Ultrasound;Functional mobility training;Therapeutic activities;Therapeutic exercise;Balance training;Neuromuscular re-education;Patient/family education;Manual techniques;Passive range of motion;Dry needling;Taping    PT Next Visit Plan review and progress exercises; assess response to manual work/myofacial release work; modalities and taping as indicated    PT [place] Agree with Plan of Care Patient             Patient will benefit from skilled therapeutic intervention  in order to improve the following deficits and  impairments:  Decreased range of motion, Decreased activity tolerance, Pain, Hypomobility, Impaired flexibility, Improper body mechanics, Decreased mobility, Decreased strength, Increased edema, Impaired sensation, Postural dysfunction  Visit Diagnosis: Thoracolumbar back pain  Muscle weakness (generalized)  Abnormal posture  Chronic bilateral low back pain without sciatica     Problem List Patient Active Problem List   Diagnosis Date Noted   Kyphosis of thoracic region 12/15/2020   Spinal stenosis of lumbar region with neurogenic claudication 07/10/2020   Migraine without aura and without status migrainosus, not intractable 07/09/2019   Loss of consciousness ([place]) 07/09/2019   GERD (gastroesophageal reflux disease) 10/11/2016   Rheumatoid arthritis involving both hands ([place]) 09/27/2016   Lumbar stenosis with neurogenic claudication 05/17/2016   Impingement syndrome of right shoulder 12/01/2015   Acute superficial gastritis without hemorrhage 07/01/2015   Varicose veins of both lower extremities with pain 05/22/2015   Age-related osteoporosis without current pathological fracture 05/12/2015   Chronic pain syndrome 05/12/2015   Essential hypertension 05/12/2015   Urge incontinence 05/12/2015   Recurrent major depressive disorder, in remission ([place]) 05/12/2015   Mixed hyperlipidemia 05/12/2015   Severe episode of recurrent major depressive disorder, without psychotic features ([place]) 05/12/2015   Obstipation 04/14/2015   Irritable bowel syndrome with constipation 02/26/2015   Other allergic rhinitis 12/26/2013   Spondylolisthesis 12/21/2012   Left lumbar radiculopathy 07/05/2012   Left leg pain 07/05/2012   Left cervical radiculopathy 07/05/2012   Dysphagia 09/02/2011   GERD with stricture 06/22/2011   Stricture of esophagus 06/22/2011    [facility] April Gordy Levan, [place], DPT 07/02/2021, 4:15 PM  [facility] [address]  [place], [place], [address] Phone: [phone]   Fax:  [phone]  Name: Carrie Austin MRN: [id number] Date of Birth: 06-01-1945

## 2021-07-09 ENCOUNTER — Ambulatory Visit: Payer: Medicare HMO | Admitting: Physical Therapy

## 2021-07-22 ENCOUNTER — Ambulatory Visit (HOSPITAL_COMMUNITY): Payer: Medicare HMO | Admitting: Psychiatry

## 2021-07-22 DIAGNOSIS — F332 Major depressive disorder, recurrent severe without psychotic features: Secondary | ICD-10-CM

## 2021-07-22 DIAGNOSIS — F325 Major depressive disorder, single episode, in full remission: Secondary | ICD-10-CM

## 2021-07-22 MED ORDER — ESCITALOPRAM OXALATE 10 MG PO TABS: ORAL_TABLET | ORAL | 5 refills | Status: DC

## 2021-07-22 MED ORDER — LORAZEPAM 0.5 MG PO TABS: ORAL_TABLET | ORAL | 5 refills | Status: DC

## 2021-07-22 NOTE — Progress Notes (Signed)
BH MD/PA/NP OP Progress Note  07/22/2021 2:02 PM Carrie Austin  MRN:  [id number]  Chief Complaint: Major depression in remission  HPI:     Today the patient is actually doing well.  She still has some lower back pain but it is not as bad.  The patient is sleeping and eating well.  Her weight is up and down and she has lost some weight she does not seem to be worried about it.  She denies depression.  She is sleeping well has a fair amount of energy.  She drinks no alcohol and uses no drugs.  She is thinking and concentrating well.  She loves music.  She reads fiction and nonfiction.  Her surgeon was Dr. Lovell Sheehan.  The patient still has a female friend who she spends time with.  Overall patient is actually doing well.  She denies any new problems.  She takes her medicines just as prescribed.   ICD-10-CM   1. Severe episode of recurrent major depressive disorder, without psychotic features (HCC)  F33.2 escitalopram (LEXAPRO) 10 MG tablet      Past Psychiatric History: See intake H&P for full details. Reviewed, with no updates at this time.   Past Medical History:  Past Medical History:  Diagnosis Date   Allergy    Anxiety    Arthritis    Cough    due allergies chronic   Depression    Dilatation of esophagus    Fibromyalgia    GERD (gastroesophageal reflux disease)    Headache(784.0)    migraines   Heart murmur    one time   Hypertension    PONV (postoperative nausea and vomiting)    hx of Vertigo after anesthesia    Past Surgical History:  Procedure Laterality Date   ABDOMINAL HYSTERECTOMY     BACK SURGERY  12/21/2012   CHOLECYSTECTOMY     COLONOSCOPY     DIAGNOSTIC LAPAROSCOPY     lap chole   EYE SURGERY Bilateral    lasik   TUBAL LIGATION      Family Psychiatric History: See intake H&P for full details. Reviewed, with no updates at this time.   Family History: No family history on file.  Social History:  Social History   Socioeconomic History   Marital  status: Widowed    Spouse name: Not on file   Number of children: Not on file   Years of education: Not on file   Highest education level: Not on file  Occupational History   Not on file  Tobacco Use   Smoking status: Never   Smokeless tobacco: Never  Vaping Use   Vaping Use: Never used  Substance and Sexual Activity   Alcohol use: No    Comment: social   Drug use: No   Sexual activity: Never  Other Topics Concern   Not on file  Social History Narrative   Not on file   Social Determinants of Health   Financial Resource Strain: Not on file  Food Insecurity: Not on file  Transportation Needs: Not on file  Physical Activity: Not on file  Stress: Not on file  Social Connections: Not on file    Allergies:  Allergies  Allergen Reactions   Lisinopril Cough    Deep cough   Cyclobenzaprine     Effects bladder    Vancomycin Itching   Amoxicillin Rash    Has patient had a PCN reaction causing immediate rash, facial/tongue/throat swelling, SOB or lightheadedness with hypotension:unsure Has  patient had a PCN reaction causing severe rash involving mucus membranes or skin necrosis:unsure Has patient had a PCN reaction that required hospitalization:NO  . Has patient had a PCN reaction occurring within the last 10 years:    Yes If all of the above answers are "NO", then may proceed with Cephalosporin use.    Cephalosporins Other (See Comments)    Flushing of the face    Metabolic Disorder Labs: No results found for: "HGBA1C", "MPG" No results found for: "PROLACTIN" No results found for: "CHOL", "TRIG", "HDL", "CHOLHDL", "VLDL", "LDLCALC" No results found for: "TSH"  Therapeutic Level Labs: No results found for: "LITHIUM" No results found for: "VALPROATE" No results found for: "CBMZ"  Current Medications: Current Outpatient Medications  Medication Sig Dispense Refill   Artificial Tear Solution (SOOTHE XP OP) Place 1 drop into both eyes 3 (three) times daily as needed  (dry eyes).     atorvastatin (LIPITOR) 20 MG tablet Take 20 mg by mouth every Wednesday.      CALCIUM PO Take 1 tablet by mouth daily.     camphor-menthol (SARNA) lotion Apply 1 application topically as needed (rash).     Cholecalciferol (VITAMIN D) 2000 UNITS CAPS Take 2,000 Units by mouth daily.     cyanocobalamin 1000 MCG tablet Take 1,000 mcg by mouth daily.     diltiazem (CARDIZEM CD) 120 MG 24 hr capsule Take 120 mg by mouth daily.     docusate sodium (COLACE) 100 MG capsule Take 100 mg by mouth 2 (two) times daily.     escitalopram (LEXAPRO) 10 MG tablet 1  qam 30 tablet 5   folic acid (FOLVITE) 1 MG tablet Take 1 mg by mouth daily.     gabapentin (NEURONTIN) 100 MG capsule Take 100 mg by mouth 3 (three) times daily.     hydrochlorothiazide (HYDRODIURIL) 25 MG tablet Take 25 mg by mouth daily.     LORazepam (ATIVAN) 0.5 MG tablet 1 bid 60 tablet 5   Magnesium 250 MG TABS Take 250 mg by mouth daily.     methotrexate (RHEUMATREX) 2.5 MG tablet Take 15 mg by mouth every Wednesday.     oxybutynin (DITROPAN-XL) 5 MG 24 hr tablet Take 5 mg by mouth at bedtime.     oxyCODONE-acetaminophen (PERCOCET/ROXICET) 5-325 MG tablet Take 1-2 tablets by mouth every 4 (four) hours as needed for severe pain. 30 tablet 0   potassium chloride (KLOR-CON) 10 MEQ tablet Take 10 mEq by mouth daily.     promethazine (PHENERGAN) 25 MG tablet Take 25 mg by mouth every 8 (eight) hours as needed for nausea or vomiting.     tiZANidine (ZANAFLEX) 2 MG tablet Take 1 tablet (2 mg total) by mouth every 6 (six) hours as needed for muscle spasms. 30 tablet 0   No current facility-administered medications for this visit.     Musculoskeletal: Strength & Muscle Tone: within normal limits Gait & Station: normal Patient leans: N/A  Psychiatric Specialty Exam: ROS  There were no vitals taken for this visit.There is no height or weight on file to calculate BMI.  General Appearance: Casual and Fairly Groomed  Eye  Contact:  Fair  Speech:  Clear and Coherent and Normal Rate  Volume:  Normal  Mood:  Anxious and Dysphoric  Affect:  Appropriate and Congruent  Thought Process:  Goal Directed and Descriptions of Associations: Intact  Orientation:  Full (Time, Place, and Person)  Thought Content: Logical   Suicidal Thoughts:  No  Homicidal  Thoughts:  No  Memory:  Immediate;   Fair  Judgement:  Intact  Insight:  Shallow  Psychomotor Activity:  Normal  Concentration:  Attention Span: Fair  Recall:  [employer] of Knowledge: Fair  Language: Good  Akathisia:  Negative  Handed:  Right  AIMS (if indicated): not done  Assets:  Agricultural consultant Housing Intimacy Social Support Transportation  ADL's:  Intact  Cognition: WNL  Sleep:  Good   Screenings: [place] Admission (Discharged) from 12/15/2020 in [place] ED from 07/15/2020 in [place] [facility] from 07/07/2020 in [facility] PREADMISSION TEST[facility]  C-SSRS RISK CATEGORY No Risk No Risk No Risk        Assessment and Plan:     Today the patient is doing well.  Her first problem is major depression.  She will continue taking Lexapro 10 mg a day.  She has an adjustment disorder with an anxious mood state which is pretty well controlled with Ativan 0.5 mg twice daily.  This patient is functioning well.  She is stable.  She will return to see me 5 months. 1. Severe episode of recurrent major depressive disorder, without psychotic features ([place])     Status of current problems: unchanged  Labs Ordered: No orders of the defined types were placed in this encounter.   Labs Reviewed: NA  Collateral Obtained/Records Reviewed: NA  Plan:  Continue Xanax, Lexapro, Elavil as prescribed RTC 3 MONTHS   Jerral Ralph, MD 07/22/2021, 2:02 PM

## 2021-07-28 ENCOUNTER — Ambulatory Visit (HOSPITAL_COMMUNITY): Payer: Medicare HMO | Admitting: Psychiatry

## 2021-09-29 ENCOUNTER — Other Ambulatory Visit (HOSPITAL_COMMUNITY): Payer: Self-pay | Admitting: Psychiatry

## 2021-09-29 DIAGNOSIS — F332 Major depressive disorder, recurrent severe without psychotic features: Secondary | ICD-10-CM

## 2021-12-23 ENCOUNTER — Encounter (HOSPITAL_COMMUNITY): Payer: Self-pay | Admitting: Psychiatry

## 2021-12-23 ENCOUNTER — Ambulatory Visit (HOSPITAL_COMMUNITY): Payer: Medicare HMO | Admitting: Psychiatry

## 2021-12-23 VITALS — BP 169/73 | HR 60 | Ht 59.0 in | Wt 88.0 lb

## 2021-12-23 DIAGNOSIS — F325 Major depressive disorder, single episode, in full remission: Secondary | ICD-10-CM

## 2021-12-23 DIAGNOSIS — F332 Major depressive disorder, recurrent severe without psychotic features: Secondary | ICD-10-CM

## 2021-12-23 MED ORDER — ESCITALOPRAM OXALATE 10 MG PO TABS: ORAL_TABLET | ORAL | 5 refills | Status: DC

## 2021-12-23 MED ORDER — LORAZEPAM 0.5 MG PO TABS: ORAL_TABLET | ORAL | 5 refills | Status: DC

## 2021-12-23 NOTE — Progress Notes (Signed)
BH MD/PA/NP OP Progress Note  12/23/2021 2:59 PM Carrie Austin  MRN:  [id number]  Chief Complaint:  Chief Complaint   Follow-up   Major depression in remission  HPI:    Today the patient is doing well.  Least emotionally.  She still has chronic back pain.  She is getting a Nature conservation officer.  The patient denies the depression.  She is eating better now.  She sleeps well.  She lives alone.  Generally her health is pretty good with chronic back pain.  Her environment that she lives it is getting worse and that they are mainly renters.  She been there 15 years.  The patient does have a good friend who she has been with for 13 years.  The patient still talks he still functions in the room.  Overall the patient is actually very stable.  She does take an opiate pain medication.   ICD-10-CM   1. Severe episode of recurrent major depressive disorder, without psychotic features (HCC)  F33.2 escitalopram (LEXAPRO) 10 MG tablet    DISCONTINUED: escitalopram (LEXAPRO) 10 MG tablet      Past Psychiatric History: See intake H&P for full details. Reviewed, with no updates at this time.   Past Medical History:  Past Medical History:  Diagnosis Date   Allergy    Anxiety    Arthritis    Cough    due allergies chronic   Depression    Dilatation of esophagus    Fibromyalgia    GERD (gastroesophageal reflux disease)    Headache(784.0)    migraines   Heart murmur    one time   Hypertension    PONV (postoperative nausea and vomiting)    hx of Vertigo after anesthesia    Past Surgical History:  Procedure Laterality Date   ABDOMINAL HYSTERECTOMY     BACK SURGERY  12/21/2012   CHOLECYSTECTOMY     COLONOSCOPY     DIAGNOSTIC LAPAROSCOPY     lap chole   EYE SURGERY Bilateral    lasik   TUBAL LIGATION      Family Psychiatric History: See intake H&P for full details. Reviewed, with no updates at this time.   Family History: No family history on file.  Social History:  Social History    Socioeconomic History   Marital status: Widowed    Spouse name: Not on file   Number of children: Not on file   Years of education: Not on file   Highest education level: Not on file  Occupational History   Not on file  Tobacco Use   Smoking status: Never   Smokeless tobacco: Never  Vaping Use   Vaping Use: Never used  Substance and Sexual Activity   Alcohol use: No    Comment: social   Drug use: No   Sexual activity: Never  Other Topics Concern   Not on file  Social History Narrative   Not on file   Social Determinants of Health   Financial Resource Strain: Not on file  Food Insecurity: Not on file  Transportation Needs: Not on file  Physical Activity: Not on file  Stress: Not on file  Social Connections: Not on file    Allergies:  Allergies  Allergen Reactions   Lisinopril Cough    Deep cough   Cyclobenzaprine     Effects bladder    Vancomycin Itching   Amoxicillin Rash    Has patient had a PCN reaction causing immediate rash, facial/tongue/throat swelling, SOB or lightheadedness  with hypotension:unsure Has patient had a PCN reaction causing severe rash involving mucus membranes or skin necrosis:unsure Has patient had a PCN reaction that required hospitalization:NO  . Has patient had a PCN reaction occurring within the last 10 years:    Yes If all of the above answers are "NO", then may proceed with Cephalosporin use.    Cephalosporins Other (See Comments)    Flushing of the face    Metabolic Disorder Labs: No results found for: "HGBA1C", "MPG" No results found for: "PROLACTIN" No results found for: "CHOL", "TRIG", "HDL", "CHOLHDL", "VLDL", "LDLCALC" No results found for: "TSH"  Therapeutic Level Labs: No results found for: "LITHIUM" No results found for: "VALPROATE" No results found for: "CBMZ"  Current Medications: Current Outpatient Medications  Medication Sig Dispense Refill   Artificial Tear Solution (SOOTHE XP OP) Place 1 drop into both  eyes 3 (three) times daily as needed (dry eyes).     atorvastatin (LIPITOR) 20 MG tablet Take 20 mg by mouth every Wednesday.      CALCIUM PO Take 1 tablet by mouth daily.     camphor-menthol (SARNA) lotion Apply 1 application topically as needed (rash).     Cholecalciferol (VITAMIN D) 2000 UNITS CAPS Take 2,000 Units by mouth daily.     cyanocobalamin 1000 MCG tablet Take 1,000 mcg by mouth daily.     diltiazem (CARDIZEM CD) 120 MG 24 hr capsule Take 120 mg by mouth daily.     docusate sodium (COLACE) 100 MG capsule Take 100 mg by mouth 2 (two) times daily.     folic acid (FOLVITE) 1 MG tablet Take 1 mg by mouth daily.     gabapentin (NEURONTIN) 100 MG capsule Take 100 mg by mouth 3 (three) times daily.     hydrochlorothiazide (HYDRODIURIL) 25 MG tablet Take 25 mg by mouth daily.     Magnesium 250 MG TABS Take 250 mg by mouth daily.     methotrexate (RHEUMATREX) 2.5 MG tablet Take 15 mg by mouth every Wednesday.     oxybutynin (DITROPAN-XL) 5 MG 24 hr tablet Take 5 mg by mouth at bedtime.     oxyCODONE-acetaminophen (PERCOCET/ROXICET) 5-325 MG tablet Take 1-2 tablets by mouth every 4 (four) hours as needed for severe pain. 30 tablet 0   potassium chloride (KLOR-CON) 10 MEQ tablet Take 10 mEq by mouth daily.     promethazine (PHENERGAN) 25 MG tablet Take 25 mg by mouth every 8 (eight) hours as needed for nausea or vomiting.     escitalopram (LEXAPRO) 10 MG tablet 1  qam 30 tablet 5   LORazepam (ATIVAN) 0.5 MG tablet 1 bid 60 tablet 5   tiZANidine (ZANAFLEX) 2 MG tablet Take 1 tablet (2 mg total) by mouth every 6 (six) hours as needed for muscle spasms. 30 tablet 0   No current facility-administered medications for this visit.     Musculoskeletal: Strength & Muscle Tone: within normal limits Gait & Station: normal Patient leans: N/A  Psychiatric Specialty Exam: ROS  Blood pressure (!) 169/73, pulse 60, height 4\' 11"  (1.499 m), weight 88 lb (39.9 kg).Body mass index is 17.77 kg/m.   General Appearance: Casual and Fairly Groomed  Eye Contact:  Fair  Speech:  Clear and Coherent and Normal Rate  Volume:  Normal  Mood:  Anxious and Dysphoric  Affect:  Appropriate and Congruent  Thought Process:  Goal Directed and Descriptions of Associations: Intact  Orientation:  Full (Time, Place, and Person)  Thought Content: Logical  Suicidal Thoughts:  No  Homicidal Thoughts:  No  Memory:  Immediate;   Fair  Judgement:  Intact  Insight:  Shallow  Psychomotor Activity:  Normal  Concentration:  Attention Span: Fair  Recall:  [employer] of Knowledge: Fair  Language: Good  Akathisia:  Negative  Handed:  Right  AIMS (if indicated): not done  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  ADL's:  Intact  Cognition: WNL  Sleep:  Good   Screenings: Flowsheet Row Admission (Discharged) from 12/15/2020 in [facility]  [facility] from 07/15/2020 in [facility] EMERGENCY DEPARTMENT Pre-Admission Testing 60 from 07/07/2020 in [facility] PREADMISSION TESTING  C-SSRS RISK CATEGORY No Risk No Risk No Risk        Assessment and Plan:    This patient's diagnosis is major depression in remission.  She continues taking Lexapro 10 mg.  She also takes Ativan 0.5 mg twice daily.  He is very stable.  He is not in therapy at this time.  She will return to see me in 5 months. 1. Severe episode of recurrent major depressive disorder, without psychotic features (HCC)     Status of current problems: unchanged  Labs Ordered: No orders of the defined types were placed in this encounter.   Labs Reviewed: NA  Collateral Obtained/Records Reviewed: NA  Plan:  Continue Xanax, Lexapro, Elavil as prescribed RTC 3 MONTHS   Gypsy Balsam, MD 12/23/2021, 2:59 PM

## 2022-05-25 ENCOUNTER — Ambulatory Visit (HOSPITAL_COMMUNITY): Payer: Medicare HMO | Admitting: Psychiatry

## 2022-06-02 ENCOUNTER — Other Ambulatory Visit: Payer: Self-pay

## 2022-06-02 ENCOUNTER — Encounter (HOSPITAL_COMMUNITY): Payer: Self-pay | Admitting: Psychiatry

## 2022-06-02 ENCOUNTER — Ambulatory Visit (HOSPITAL_COMMUNITY): Payer: Medicare HMO | Admitting: Psychiatry

## 2022-06-02 VITALS — BP 159/69 | HR 100 | Ht 59.0 in | Wt 87.0 lb

## 2022-06-02 DIAGNOSIS — F332 Major depressive disorder, recurrent severe without psychotic features: Secondary | ICD-10-CM

## 2022-06-02 DIAGNOSIS — F329 Major depressive disorder, single episode, unspecified: Secondary | ICD-10-CM

## 2022-06-02 MED ORDER — ESCITALOPRAM OXALATE 20 MG PO TABS
ORAL_TABLET | ORAL | 5 refills | Status: DC
Start: 2022-06-02 — End: 2022-08-27

## 2022-06-02 MED ORDER — LORAZEPAM 0.5 MG PO TABS: ORAL_TABLET | ORAL | 5 refills | Status: DC

## 2022-06-02 NOTE — Progress Notes (Signed)
BH MD/PA/NP OP Progress Note  06/02/2022 1:42 PM MIRCALE CHEAH  MRN:  [id number]  Chief Complaint:  Chief Complaint   Follow-up    Major depression in remission  HPI:      Today the patient is not doing well.  She was emotionally seemingly stable but had a lot of.  Today she still has a lot of back pain but she is scheduled for back surgery in a few days.  She is somewhat scared but she still wants it.  She will be hospitalized for.  Days.  It should be noticed that there are other stresses that were not initially apparent.  Her brother is very hospitalized at this time he has diabetes.  He has significant symptoms the patient has lost a significant amount of weight.  She lost almost 15 pounds in the last year.  She has significant osteoporosis but that seems to have been resolved to some degree with medications.  Patient admits that she is very depressed.  She describes herself as being anxious as well.  She is not sure which ones more prominent.  Her energy level is reduced.  Her appetite is reduced.  She is sleeping okay.  She is still enjoying things like reading TV and music.  The patient is teary.  I suspect it is a combination of depression and anxiety and fear about the upcoming surgery.  She likely also is very worried about her brother.   ICD-10-CM   1. Severe episode of recurrent major depressive disorder, without psychotic features (HCC)  F33.2 escitalopram (LEXAPRO) 20 MG tablet      Past Psychiatric History: See intake H&P for full details. Reviewed, with no updates at this time.   Past Medical History:  Past Medical History:  Diagnosis Date   Allergy    Anxiety    Arthritis    Cough    due allergies chronic   Depression    Dilatation of esophagus    Fibromyalgia    GERD (gastroesophageal reflux disease)    Headache(784.0)    migraines   Heart murmur    one time   Hypertension    PONV (postoperative nausea and vomiting)    hx of Vertigo after anesthesia     Past Surgical History:  Procedure Laterality Date   ABDOMINAL HYSTERECTOMY     BACK SURGERY  12/21/2012   CHOLECYSTECTOMY     COLONOSCOPY     DIAGNOSTIC LAPAROSCOPY     lap chole   EYE SURGERY Bilateral    lasik   TUBAL LIGATION      Family Psychiatric History: See intake H&P for full details. Reviewed, with no updates at this time.   Family History: History reviewed. No pertinent family history.  Social History:  Social History   Socioeconomic History   Marital status: Widowed    Spouse name: Not on file   Number of children: Not on file   Years of education: Not on file   Highest education level: Not on file  Occupational History   Not on file  Tobacco Use   Smoking status: Never   Smokeless tobacco: Never  Vaping Use   Vaping Use: Never used  Substance and Sexual Activity   Alcohol use: No    Comment: social   Drug use: No   Sexual activity: Never  Other Topics Concern   Not on file  Social History Narrative   Not on file   Social Determinants of Health   Financial Resource  Strain: Not on file  Food Insecurity: Not on file  Transportation Needs: Not on file  Physical Activity: Not on file  Stress: Not on file  Social Connections: Not on file    Allergies:  Allergies  Allergen Reactions   Lisinopril Cough    Deep cough   Cyclobenzaprine     Effects bladder    Vancomycin Itching   Amoxicillin Rash    Has patient had a PCN reaction causing immediate rash, facial/tongue/throat swelling, SOB or lightheadedness with hypotension:unsure Has patient had a PCN reaction causing severe rash involving mucus membranes or skin necrosis:unsure Has patient had a PCN reaction that required hospitalization:NO  . Has patient had a PCN reaction occurring within the last 10 years:    Yes If all of the above answers are "NO", then may proceed with Cephalosporin use.    Cephalosporins Other (See Comments)    Flushing of the face    Metabolic Disorder Labs: No  results found for: "HGBA1C", "MPG" No results found for: "PROLACTIN" No results found for: "CHOL", "TRIG", "HDL", "CHOLHDL", "VLDL", "LDLCALC" No results found for: "TSH"  Therapeutic Level Labs: No results found for: "LITHIUM" No results found for: "VALPROATE" No results found for: "CBMZ"  Current Medications: Current Outpatient Medications  Medication Sig Dispense Refill   Artificial Tear Solution (SOOTHE XP OP) Place 1 drop into both eyes 3 (three) times daily as needed (dry eyes).     atorvastatin (LIPITOR) 20 MG tablet Take 20 mg by mouth every Wednesday.      CALCIUM PO Take 1 tablet by mouth daily.     camphor-menthol (SARNA) lotion Apply 1 application topically as needed (rash).     Cholecalciferol (VITAMIN D) 2000 UNITS CAPS Take 2,000 Units by mouth daily.     cyanocobalamin 1000 MCG tablet Take 1,000 mcg by mouth daily.     diltiazem (CARDIZEM CD) 120 MG 24 hr capsule Take 120 mg by mouth daily.     docusate sodium (COLACE) 100 MG capsule Take 100 mg by mouth 2 (two) times daily.     folic acid (FOLVITE) 1 MG tablet Take 1 mg by mouth daily.     gabapentin (NEURONTIN) 100 MG capsule Take 100 mg by mouth 3 (three) times daily.     Magnesium 250 MG TABS Take 250 mg by mouth daily.     methotrexate (RHEUMATREX) 2.5 MG tablet Take 15 mg by mouth every Wednesday.     ondansetron (ZOFRAN-ODT) 4 MG disintegrating tablet Take 4 mg by mouth as needed for nausea or vomiting.     oxybutynin (DITROPAN-XL) 5 MG 24 hr tablet Take 5 mg by mouth at bedtime.     potassium chloride (KLOR-CON) 10 MEQ tablet Take 10 mEq by mouth daily.     escitalopram (LEXAPRO) 20 MG tablet 1  qam 30 tablet 5   hydrochlorothiazide (HYDRODIURIL) 25 MG tablet Take 25 mg by mouth daily. (Patient not taking: Reported on 06/02/2022)     LORazepam (ATIVAN) 0.5 MG tablet 1qam  2  qhs 90 tablet 5   oxyCODONE-acetaminophen (PERCOCET/ROXICET) 5-325 MG tablet Take 1-2 tablets by mouth every 4 (four) hours as needed for  severe pain. (Patient not taking: Reported on 06/02/2022) 30 tablet 0   promethazine (PHENERGAN) 25 MG tablet Take 25 mg by mouth every 8 (eight) hours as needed for nausea or vomiting. (Patient not taking: Reported on 06/02/2022)     No current facility-administered medications for this visit.     Musculoskeletal: Strength &  Muscle Tone: within normal limits Gait & Station: normal Patient leans: N/A  Psychiatric Specialty Exam: ROS  Blood pressure (!) 159/69, pulse 100, height 4\' 11"  (1.499 m), weight 87 lb (39.5 kg), SpO2 100 %.Body mass index is 17.57 kg/m.  General Appearance: Casual and Fairly Groomed  Eye Contact:  Fair  Speech:  Clear and Coherent and Normal Rate  Volume:  Normal  Mood:  Anxious and Dysphoric  Affect:  Appropriate and Congruent  Thought Process:  Goal Directed and Descriptions of Associations: Intact  Orientation:  Full (Time, Place, and Person)  Thought Content: Logical   Suicidal Thoughts:  No  Homicidal Thoughts:  No  Memory:  Immediate;   Fair  Judgement:  Intact  Insight:  Shallow  Psychomotor Activity:  Normal  Concentration:  Attention Span: Fair  Recall:  [employer] of Knowledge: Fair  Language: Good  Akathisia:  Negative  Handed:  Right  AIMS (if indicated): not done  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  ADL's:  Intact  Cognition: WNL  Sleep:  Good   Screenings: PHQ2-9    Flowsheet Row [facility]  PHQ-2 Total Score 6  PHQ-9 Total Score 14      Flowsheet Row Admission (Discharged) from 12/15/2020 in [facility] ED from 07/15/2020 in [facility] Emergency Department at [facility] Pre-Admission Testing 60 from 07/07/2020 in [facility] PREADMISSION TESTING  C-SSRS RISK CATEGORY No Risk No Risk No Risk        Assessment and Plan:        This patient's diagnosis is major depression recurrent.  Today we will go ahead and increase her Lexapro from 10 mg to a higher dose of 20 mg.  We will also slightly increase her Ativan to take 0.5 mg 1 in the morning and 2 at night.  She was taking 1 twice daily.  Patient will have surgery in the next week.  She will return to see me in approximately 6 to 7 weeks.  The possibility of adding mirtazapine should be considered at that time.  I chose not to add that given the fact that her energy level is low and then she is sleeping well and I would not want to sedate her anymore.  The risks of adding Remeron could do that.  She says she eats 3 meals but there is smaller meals now.  Etiology of her anorexia is not clear it might be related to her pain. 1. Severe episode of recurrent major depressive disorder, without psychotic features (HCC)     Status of current problems: unchanged  Labs Ordered: No orders of the defined types were placed in this encounter.   Labs Reviewed: NA  Collateral Obtained/Records Reviewed: NA  Plan:  Continue Xanax, Lexapro, Elavil as prescribed RTC 3 MONTHS   Gypsy Balsam, MD 06/02/2022, 1:42 PM

## 2022-07-17 ENCOUNTER — Other Ambulatory Visit (HOSPITAL_BASED_OUTPATIENT_CLINIC_OR_DEPARTMENT_OTHER): Payer: Self-pay | Admitting: Family Medicine

## 2022-07-27 ENCOUNTER — Ambulatory Visit (HOSPITAL_COMMUNITY): Payer: Medicare HMO | Admitting: Psychiatry

## 2022-08-27 ENCOUNTER — Ambulatory Visit (HOSPITAL_BASED_OUTPATIENT_CLINIC_OR_DEPARTMENT_OTHER): Payer: Medicare HMO | Admitting: Psychiatry

## 2022-08-27 DIAGNOSIS — F332 Major depressive disorder, recurrent severe without psychotic features: Secondary | ICD-10-CM

## 2022-08-27 DIAGNOSIS — F324 Major depressive disorder, single episode, in partial remission: Secondary | ICD-10-CM

## 2022-08-27 MED ORDER — LORAZEPAM 0.5 MG PO TABS: ORAL_TABLET | ORAL | 5 refills | Status: DC

## 2022-08-27 MED ORDER — ESCITALOPRAM OXALATE 20 MG PO TABS
ORAL_TABLET | ORAL | 5 refills | Status: DC
Start: 2022-08-27 — End: 2023-02-15

## 2022-08-27 NOTE — Progress Notes (Signed)
BH MD/PA/NP OP Progress Note  08/27/2022 11:58 AM Carrie Austin  MRN:  [id number]  Chief Complaint:   Major depression in remission  HPI:     Today the patient is seen and she is doing better.  She had back surgery and was successful.  She is 90% better.  Her brother is still having some problems.  The patient also has an older son who is now separated and he is having some issues.  Overall the patient says that she is better and her mood is improved and attributes to some degree to the increase in the Lexapro to 20 mg.  Her appetite is still down but she is trying to make herself eat.  She says the increase of Ativan has helped her sleep better and it translates into having a bit more energy during the day.  She drinks no alcohol and uses no drugs.  Overall she is improved.  Interestingly she has a boyfriend that she has had for 15 years and is 10 years older than him her.  He is actually going to move to [place] but does not seem to bother her.  She seems more resilient than she appears.  Overall the reduction of back pain she feels better.  She will return to see me in 4 to 5 months.   ICD-10-CM   1. Severe episode of recurrent major depressive disorder, without psychotic features (HCC)  F33.2 escitalopram (LEXAPRO) 20 MG tablet      Past Psychiatric History: See intake H&P for full details. Reviewed, with no updates at this time.   Past Medical History:  Past Medical History:  Diagnosis Date   Allergy    Anxiety    Arthritis    Cough    due allergies chronic   Depression    Dilatation of esophagus    Fibromyalgia    GERD (gastroesophageal reflux disease)    Headache(784.0)    migraines   Heart murmur    one time   Hypertension    PONV (postoperative nausea and vomiting)    hx of Vertigo after anesthesia    Past Surgical History:  Procedure Laterality Date   ABDOMINAL HYSTERECTOMY     BACK SURGERY  12/21/2012   CHOLECYSTECTOMY     COLONOSCOPY     DIAGNOSTIC  LAPAROSCOPY     lap chole   EYE SURGERY Bilateral    lasik   TUBAL LIGATION      Family Psychiatric History: See intake H&P for full details. Reviewed, with no updates at this time.   Family History: No family history on file.  Social History:  Social History   Socioeconomic History   Marital status: Widowed    Spouse name: Not on file   Number of children: Not on file   Years of education: Not on file   Highest education level: Not on file  Occupational History   Not on file  Tobacco Use   Smoking status: Never   Smokeless tobacco: Never  Vaping Use   Vaping status: Never Used  Substance and Sexual Activity   Alcohol use: No    Comment: social   Drug use: No   Sexual activity: Never  Other Topics Concern   Not on file  Social History Narrative   Not on file   Social Determinants of Health   Financial Resource Strain: Not on file  Food Insecurity: Low Risk  (03/24/2022)   Received from [facility]   Food vital  sign    Within the past 12 months, you worried that your food would run out before you got money to buy more: Never true    Within the past 12 months, the food you bought just didn't last and you didn't have money to get more. : Never true  Transportation Needs: No Transportation Needs (03/24/2022)   [facility]   Transportation    In the past 12 months, has lack of reliable transportation kept you from medical appointments, meetings, work or from getting things needed for daily living? : No  Physical Activity: Not on file  Stress: No Stress Concern Present (07/10/2021)   Received from [facility], [facility] of Occupational [facility] - Occupational Stress Questionnaire    Feeling of Stress : Not at all  Social Connections: Unknown (05/22/2021)   Received from [facility], [facility]   Social Network    Social Network: Not on file    Allergies:  Allergies  Allergen Reactions    Lisinopril Cough    Deep cough   Cyclobenzaprine     Effects bladder    Vancomycin Itching   Amoxicillin Rash    Has patient had a PCN reaction causing immediate rash, facial/tongue/throat swelling, SOB or lightheadedness with hypotension:unsure Has patient had a PCN reaction causing severe rash involving mucus membranes or skin necrosis:unsure Has patient had a PCN reaction that required hospitalization:NO  . Has patient had a PCN reaction occurring within the last 10 years:    Yes If all of the above answers are "NO", then may proceed with Cephalosporin use.    Cephalosporins Other (See Comments)    Flushing of the face    Metabolic Disorder Labs: No results found for: "HGBA1C", "MPG" No results found for: "PROLACTIN" No results found for: "CHOL", "TRIG", "HDL", "CHOLHDL", "VLDL", "LDLCALC" No results found for: "TSH"  Therapeutic Level Labs: No results found for: "LITHIUM" No results found for: "VALPROATE" No results found for: "CBMZ"  Current Medications: Current Outpatient Medications  Medication Sig Dispense Refill   Artificial Tear Solution (SOOTHE XP OP) Place 1 drop into both eyes 3 (three) times daily as needed (dry eyes).     atorvastatin (LIPITOR) 20 MG tablet Take 20 mg by mouth every Wednesday.      CALCIUM PO Take 1 tablet by mouth daily.     camphor-menthol (SARNA) lotion Apply 1 application topically as needed (rash).     Cholecalciferol (VITAMIN D) 2000 UNITS CAPS Take 2,000 Units by mouth daily.     cyanocobalamin 1000 MCG tablet Take 1,000 mcg by mouth daily.     diltiazem (CARDIZEM CD) 120 MG 24 hr capsule Take 120 mg by mouth daily.     docusate sodium (COLACE) 100 MG capsule Take 100 mg by mouth 2 (two) times daily.     escitalopram (LEXAPRO) 20 MG tablet 1  qam 30 tablet 5   folic acid (FOLVITE) 1 MG tablet Take 1 mg by mouth daily.     gabapentin (NEURONTIN) 100 MG capsule Take 100 mg by mouth 3 (three) times daily.     hydrochlorothiazide  (HYDRODIURIL) 25 MG tablet Take 25 mg by mouth daily. (Patient not taking: Reported on 06/02/2022)     LORazepam (ATIVAN) 0.5 MG tablet 1qam  2  qhs 90 tablet 5   Magnesium 250 MG TABS Take 250 mg by mouth daily.     methotrexate (RHEUMATREX) 2.5 MG tablet Take 15 mg by mouth every  Wednesday.     ondansetron (ZOFRAN-ODT) 4 MG disintegrating tablet Take 4 mg by mouth as needed for nausea or vomiting.     oxybutynin (DITROPAN-XL) 5 MG 24 hr tablet Take 5 mg by mouth at bedtime.     oxyCODONE-acetaminophen (PERCOCET/ROXICET) 5-325 MG tablet Take 1-2 tablets by mouth every 4 (four) hours as needed for severe pain. (Patient not taking: Reported on 06/02/2022) 30 tablet 0   potassium chloride (KLOR-CON) 10 MEQ tablet Take 10 mEq by mouth daily.     promethazine (PHENERGAN) 25 MG tablet Take 25 mg by mouth every 8 (eight) hours as needed for nausea or vomiting. (Patient not taking: Reported on 06/02/2022)     No current facility-administered medications for this visit.     Musculoskeletal: Strength & Muscle Tone: within normal limits Gait & Station: normal Patient leans: N/A  Psychiatric Specialty Exam: ROS  There were no vitals taken for this visit.There is no height or weight on file to calculate BMI.  General Appearance: Casual and Fairly Groomed  Eye Contact:  Fair  Speech:  Clear and Coherent and Normal Rate  Volume:  Normal  Mood:  Anxious and Dysphoric  Affect:  Appropriate and Congruent  Thought Process:  Goal Directed and Descriptions of Associations: Intact  Orientation:  Full (Time, Place, and Person)  Thought Content: Logical   Suicidal Thoughts:  No  Homicidal Thoughts:  No  Memory:  Immediate;   Fair  Judgement:  Intact  Insight:  Shallow  Psychomotor Activity:  Normal  Concentration:  Attention Span: Fair  Recall:  [employer] of Knowledge: Fair  Language: Good  Akathisia:  Negative  Handed:  Right  AIMS (if indicated): not done  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  ADL's:  Intact  Cognition: WNL  Sleep:  Good   Screenings: PHQ2-9    Flowsheet Row Office Visit from [facility]02/20[facility] in [facility]  PHQ-2 Total Score 6  PHQ-9 Total Score 14      Flowsheet Row Admission (Discharged) from 12/15/2020 in [facility] ED from 07/15/2020 in [facility] Emergency Department at [facility] Pre-Admission Testing 60 from 07/07/2020 in [facility] PREADMISSION TESTING  C-SSRS RISK CATEGORY No Risk No Risk No Risk        Assessment and Plan:       This patient's diagnosis is major depression recurrent.  Today we will go ahead and increase her Lexapro from 10 mg to a higher dose of 20 mg.  We will also slightly increase her Ativan to take 0.5 mg 1 in the morning and 2 at night.  She was taking 1 twice daily.  Patient will have surgery in the next week.  She will return to see me in approximately 6 to 7 weeks.  The possibility of adding mirtazapine should be considered at that time.  I chose not to add that given the fact that her energy level is low and then she is sleeping well and I would not want to sedate her anymore.  The risks of adding Remeron could do that.  She says she eats 3 meals but there is smaller meals now.  Etiology of her anorexia is not clear it might be related to her pain. 1. Severe episode of recurrent major depressive disorder, without psychotic features (HCC)     Status of current problems: unchanged  Labs Ordered: No orders of the  defined types were placed in this encounter.   Labs Reviewed: NA  Collateral Obtained/Records Reviewed: NA  Plan:  Continue Xanax, Lexapro, Elavil as prescribed RTC 3 MONTHS   Gypsy Balsam, MD 08/27/2022, 11:58 AM

## 2022-10-07 IMAGING — RF DG C-ARM 1-60 MIN
1 series · 2 of 2 positions shown · non-contrast
Comparison: Radiographs July 07, 2020.

CLINICAL DATA: Posterior lumbar interbody fusion with interbody
prosthesis at L1-L2; extended instrumentation to T10.

EXAM:
DG C-ARM 1-60 MIN; THORACOLUMBAR SPINE - 2 VIEW
FLUOROSCOPY TIME:  Fluoroscopy Time:  23.4 seconds.
Radiation Exposure Index (if provided by the fluoroscopic device):
2.2182 mGy.
Number of Acquired Spot Images: 2

[Series 1: unknown protocol · 0.14mm/px · 2 of 2 slices shown]
[im 1/2]
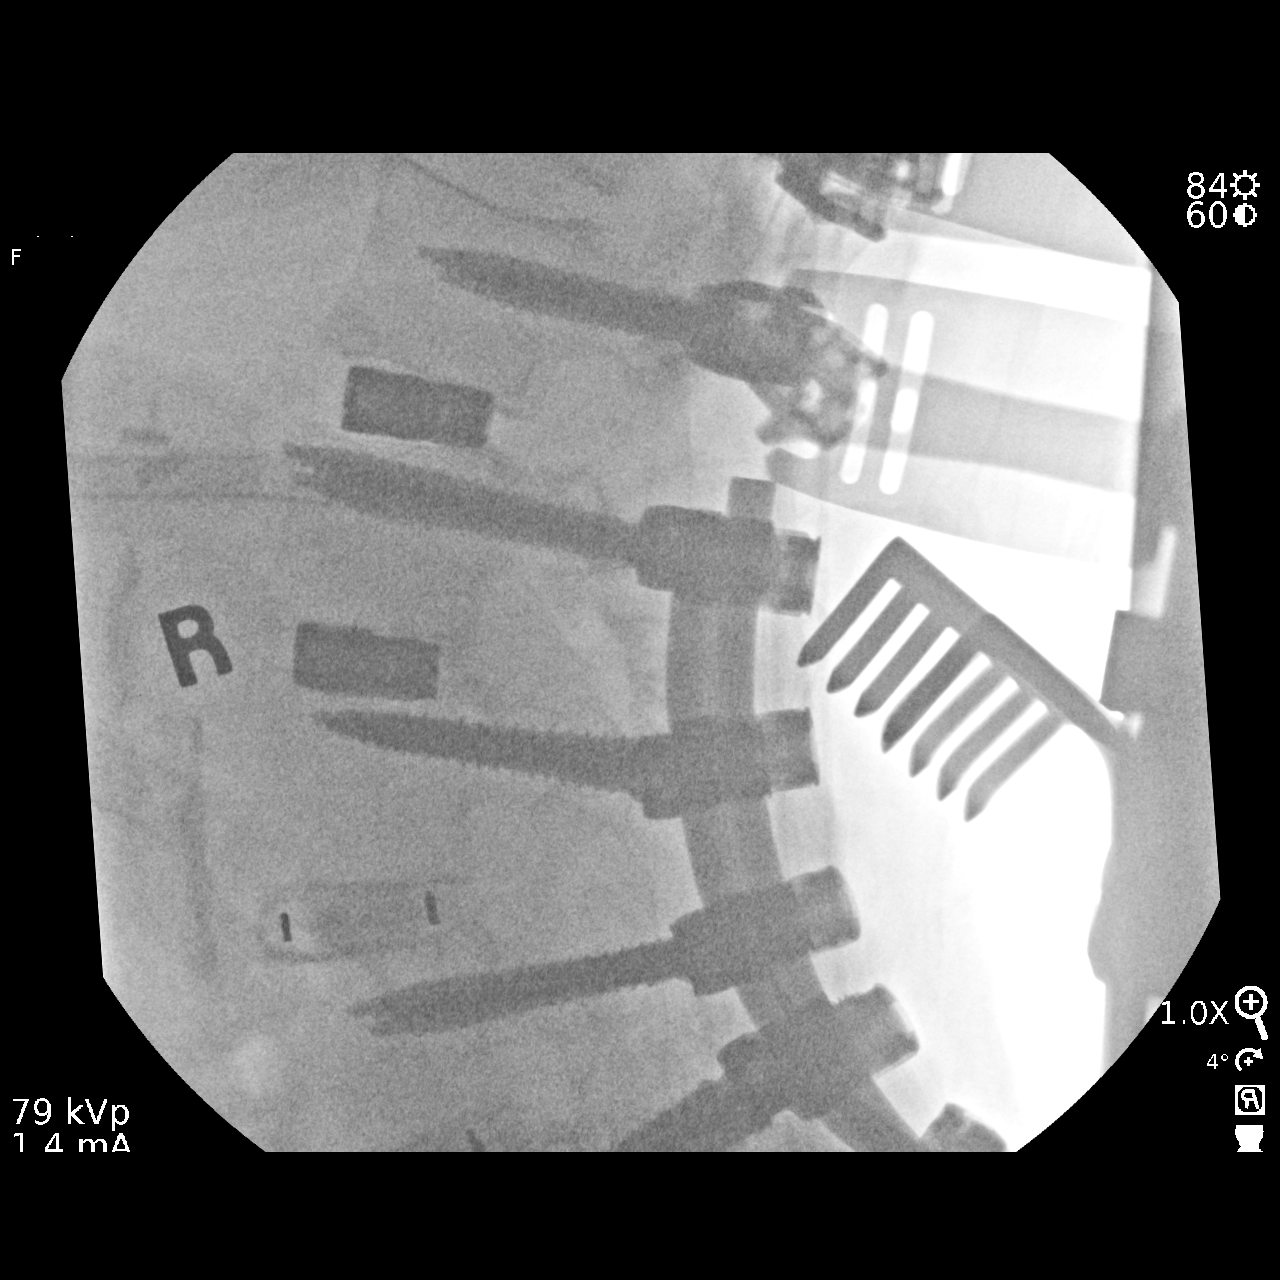
[im 2/2]
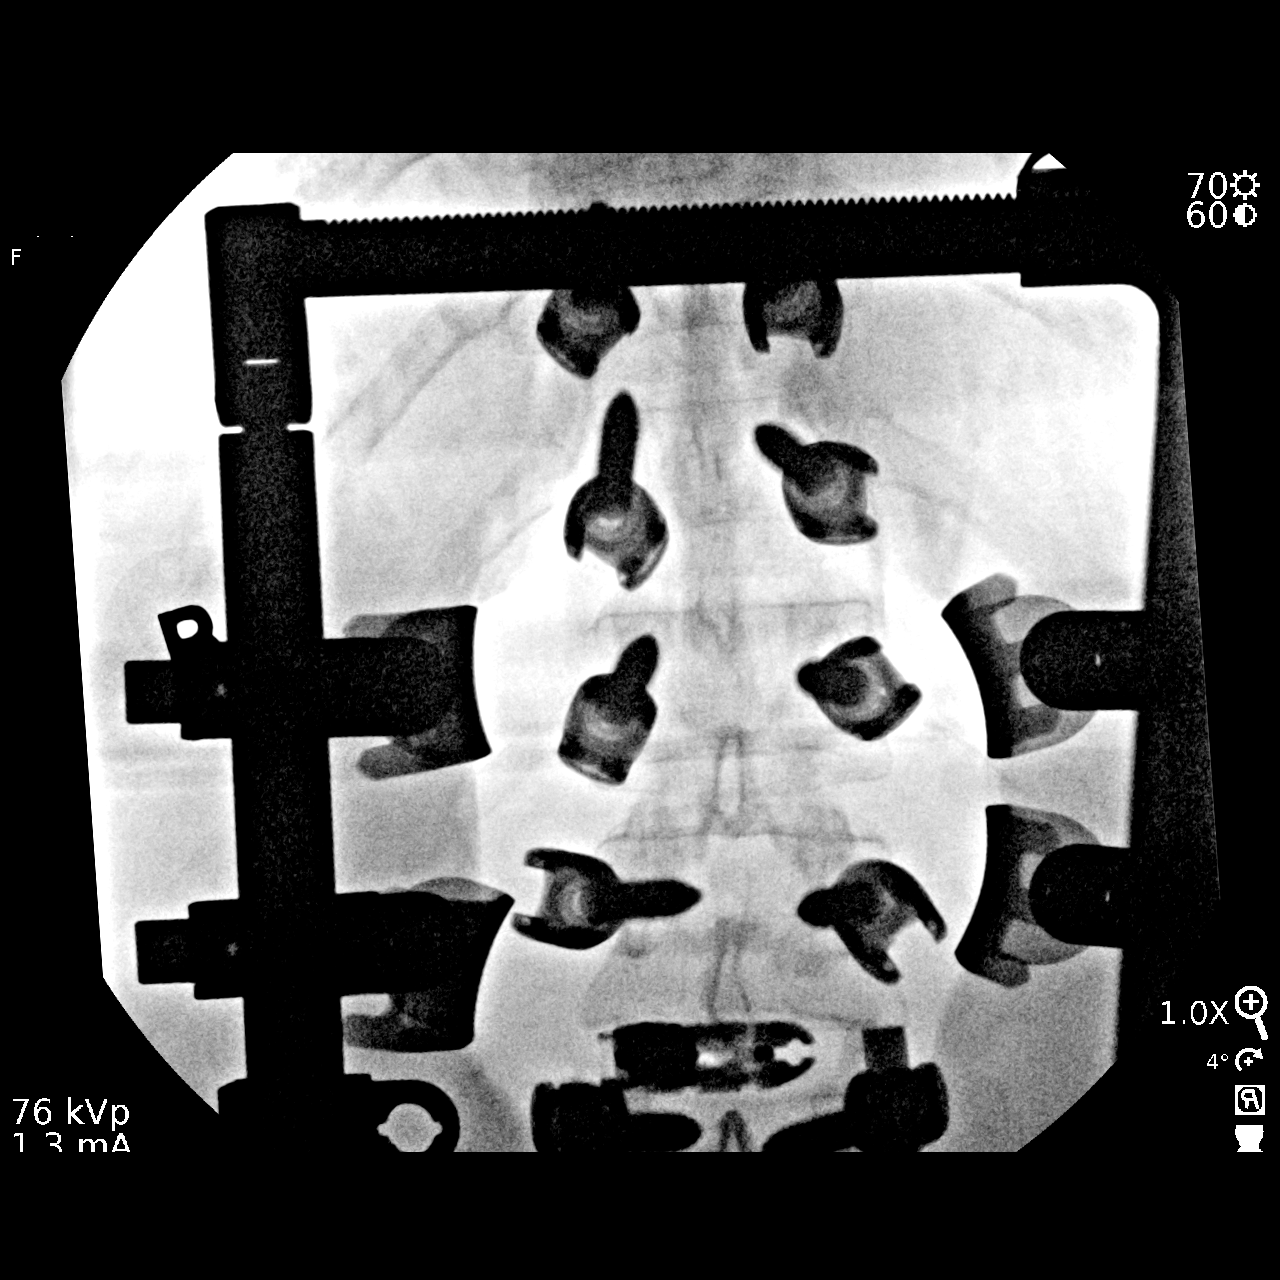

[2 of 2 positions shown; findings below may reference images not displayed]

FINDINGS: Two C-arm fluoroscopic images were obtained intraoperatively and
submitted for post operative interpretation. These images
demonstrate partially imaged a previous posterior lumbosacral fusion
spanning from L2 inferiorly with new/interval placement of bilateral
pedicle screws at T10, T11, T12, and L1 with intervening L1-L2
spacer. No unexpected findings or unexpected foreign bodies. Please
see the performing provider's procedural report for further detail.
IMPRESSION: Intraoperative fluoroscopy, as detailed above.

## 2022-11-13 ENCOUNTER — Other Ambulatory Visit (HOSPITAL_BASED_OUTPATIENT_CLINIC_OR_DEPARTMENT_OTHER): Payer: Self-pay | Admitting: Family Medicine

## 2022-12-20 ENCOUNTER — Other Ambulatory Visit (HOSPITAL_BASED_OUTPATIENT_CLINIC_OR_DEPARTMENT_OTHER): Payer: Self-pay | Admitting: Family Medicine

## 2023-01-26 ENCOUNTER — Ambulatory Visit (HOSPITAL_COMMUNITY): Payer: Medicare HMO | Admitting: Psychiatry

## 2023-02-15 ENCOUNTER — Other Ambulatory Visit: Payer: Self-pay

## 2023-02-15 ENCOUNTER — Encounter (HOSPITAL_COMMUNITY): Payer: Self-pay | Admitting: Psychiatry

## 2023-02-15 ENCOUNTER — Ambulatory Visit (HOSPITAL_COMMUNITY): Payer: Medicare HMO | Admitting: Psychiatry

## 2023-02-15 VITALS — BP 178/73 | HR 81 | Ht 59.0 in | Wt 83.0 lb

## 2023-02-15 DIAGNOSIS — F332 Major depressive disorder, recurrent severe without psychotic features: Secondary | ICD-10-CM

## 2023-02-15 DIAGNOSIS — F324 Major depressive disorder, single episode, in partial remission: Secondary | ICD-10-CM

## 2023-02-15 MED ORDER — ESCITALOPRAM OXALATE 20 MG PO TABS: ORAL_TABLET | ORAL | 5 refills | Status: DC

## 2023-02-15 MED ORDER — LORAZEPAM 0.5 MG PO TABS: ORAL_TABLET | ORAL | 5 refills | Status: DC
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
## 2023-02-18 ENCOUNTER — Other Ambulatory Visit (HOSPITAL_BASED_OUTPATIENT_CLINIC_OR_DEPARTMENT_OTHER): Payer: Self-pay | Admitting: Family Medicine

## 2023-04-01 ENCOUNTER — Other Ambulatory Visit (HOSPITAL_COMMUNITY): Payer: Self-pay | Admitting: Psychiatry

## 2023-04-01 DIAGNOSIS — F332 Major depressive disorder, recurrent severe without psychotic features: Secondary | ICD-10-CM

## 2023-05-17 ENCOUNTER — Ambulatory Visit (HOSPITAL_COMMUNITY): Payer: Medicare HMO | Admitting: Psychiatry

## 2023-05-17 ENCOUNTER — Other Ambulatory Visit: Payer: Self-pay

## 2023-05-17 ENCOUNTER — Encounter (HOSPITAL_COMMUNITY): Payer: Self-pay | Admitting: Psychiatry

## 2023-05-17 VITALS — BP 185/61 | HR 69 | Ht 59.0 in | Wt 87.4 lb

## 2023-05-17 DIAGNOSIS — F325 Major depressive disorder, single episode, in full remission: Secondary | ICD-10-CM

## 2023-05-17 MED ORDER — LORAZEPAM 0.5 MG PO TABS: ORAL_TABLET | ORAL | 5 refills | Status: DC

## 2023-05-17 MED ORDER — ESCITALOPRAM OXALATE 10 MG PO TABS: 10.0000 mg | ORAL_TABLET | Freq: Every day | ORAL | 5 refills | Status: AC
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
## 2023-10-18 ENCOUNTER — Other Ambulatory Visit: Payer: Self-pay

## 2023-10-18 ENCOUNTER — Encounter (HOSPITAL_COMMUNITY): Payer: Self-pay | Admitting: Psychiatry

## 2023-10-18 ENCOUNTER — Ambulatory Visit (HOSPITAL_COMMUNITY): Admitting: Psychiatry

## 2023-10-18 VITALS — BP 178/76 | HR 59 | Ht 59.0 in | Wt 89.0 lb

## 2023-10-18 DIAGNOSIS — F325 Major depressive disorder, single episode, in full remission: Secondary | ICD-10-CM | POA: Diagnosis not present

## 2023-10-18 DIAGNOSIS — F332 Major depressive disorder, recurrent severe without psychotic features: Secondary | ICD-10-CM

## 2023-10-18 MED ORDER — LORAZEPAM 0.5 MG PO TABS
ORAL_TABLET | ORAL | 5 refills | Status: AC
Start: 2023-10-18 — End: ?

## 2023-10-18 MED ORDER — ESCITALOPRAM OXALATE 20 MG PO TABS: ORAL_TABLET | ORAL | 5 refills | Status: AC

## 2023-10-18 NOTE — Progress Notes (Signed)
 [facility] MD/PA/NP OP Progress Note  10/18/2023 3:11 PM Carrie Austin  MRN:  [id number]  Chief Complaint:   Major depression in remission  HPI:    Today the patient is seen in the office.  Patient is actually doing very well.  She is sleeping and eating well.  Physically her health is good.  She takes her medicines as prescribed.  She drinks no alcohol  and uses no drugs.  The reduction of reduction in her Lexapro  down to 10 mg was tolerated very well.  She still takes Ativan  1 morning and 2 at night.  Patient's anxiety and depression is well-controlled.  Patient is doing great.   ICD-10-CM   1. Severe episode of recurrent major depressive disorder, without psychotic features (HCC)  F33.2 escitalopram  (LEXAPRO ) 20 MG tablet       Past Psychiatric History: See intake H&P for full details. Reviewed, with no updates at this time.   Past Medical History:  Past Medical History:  Diagnosis Date   Allergy    Anxiety    Arthritis    Cough    due allergies chronic   Depression    Dilatation of esophagus    Fibromyalgia    GERD (gastroesophageal reflux disease)    Headache(784.0)    migraines   Heart murmur    one time   Hypertension    PONV (postoperative nausea and vomiting)    hx of Vertigo after anesthesia    Past Surgical History:  Procedure Laterality Date   ABDOMINAL HYSTERECTOMY     BACK SURGERY  12/21/2012   CHOLECYSTECTOMY     COLONOSCOPY     DIAGNOSTIC LAPAROSCOPY     lap chole   EYE SURGERY Bilateral    lasik   TUBAL LIGATION      Family Psychiatric History: See intake H&P for full details. Reviewed, with no updates at this time.   Family History: No family history on file.  Social History:  Social History   Socioeconomic History   Marital status: Widowed    Spouse name: Not on file   Number of children: Not on file   Years of education: Not on file   Highest education level: Not on file  Occupational History   Not on file  Tobacco Use   Smoking  status: Never   Smokeless tobacco: Never  Vaping Use   Vaping status: Never Used  Substance and Sexual Activity   Alcohol  use: No    Comment: social   Drug use: No   Sexual activity: Never  Other Topics Concern   Not on file  Social History Narrative   Not on file   Social Drivers of Health   Financial Resource Strain: Not on file  Food Insecurity: Low Risk  (06/13/2023)   Received from [facility]   Hunger Vital Sign    Within the past 12 months, you worried that your food would run out before you got money to buy more: Never true    Within the past 12 months, the food you bought just didn't last and you didn't have money to get more. : Never true  Transportation Needs: No Transportation Needs (06/13/2023)   Received from [facility]    In the past 12 months, has lack of reliable transportation kept you from medical appointments, meetings, work or from getting things needed for daily living? : No  Physical Activity: Not on file  Stress: No Stress Concern Present (07/10/2021)   Received from  [facility]   [employer] of Occupational Health - Occupational Stress Questionnaire    Feeling of Stress : Not at all  Social Connections: Unknown (05/22/2021)   Received from [facility]   Social Network    Social Network: Not on file    Allergies:  Allergies  Allergen Reactions   Lisinopril Cough    Deep cough   Cyclobenzaprine      Effects bladder    Vancomycin  Itching   Amoxicillin Rash    Has patient had a PCN reaction causing immediate rash, facial/tongue/throat swelling, SOB or lightheadedness with hypotension:unsure Has patient had a PCN reaction causing severe rash involving mucus membranes or skin necrosis:unsure Has patient had a PCN reaction that required hospitalization:NO  . Has patient had a PCN reaction occurring within the last 10 years:    Yes If all of the above answers are NO, then may proceed with Cephalosporin use.     Cephalosporins Other (See Comments)    Flushing of the face    Metabolic Disorder Labs: No results found for: HGBA1C, MPG No results found for: PROLACTIN No results found for: CHOL, TRIG, HDL, CHOLHDL, VLDL, LDLCALC No results found for: TSH  Therapeutic Level Labs: No results found for: LITHIUM No results found for: VALPROATE No results found for: CBMZ  Current Medications: Current Outpatient Medications  Medication Sig Dispense Refill   atorvastatin  (LIPITOR) 20 MG tablet Take 20 mg by mouth every Wednesday.      CALCIUM  PO Take 1 tablet by mouth daily.     camphor-menthol  (SARNA) lotion Apply 1 application topically as needed (rash).     Cholecalciferol  (VITAMIN D ) 2000 UNITS CAPS Take 2,000 Units by mouth daily.     cyanocobalamin 1000 MCG tablet Take 1,000 mcg by mouth daily.     diltiazem  (CARDIZEM  CD) 120 MG 24 hr capsule Take 120 mg by mouth daily.     docusate sodium  (COLACE) 100 MG capsule Take 100 mg by mouth 2 (two) times daily.     escitalopram  (LEXAPRO ) 10 MG tablet Take 1 tablet (10 mg total) by mouth daily. 30 tablet 5   folic acid (FOLVITE) 1 MG tablet Take 1 mg by mouth daily.     gabapentin  (NEURONTIN ) 100 MG capsule Take 100 mg by mouth 3 (three) times daily.     losartan  (COZAAR ) 100 MG tablet Take 100 mg by mouth daily. (Patient taking differently: Take 75 mg by mouth daily.)     Magnesium  250 MG TABS Take 250 mg by mouth daily.     meloxicam (MOBIC) 7.5 MG tablet TAKE 1 TABLET BY MOUTH IN THE MORNING AND AT BEDTIME. 180 tablet 3   methocarbamol (ROBAXIN) 500 MG tablet Take 500 mg by mouth 3 (three) times daily.     methotrexate (RHEUMATREX) 2.5 MG tablet Take 15 mg by mouth every Wednesday.     potassium chloride  (KLOR-CON ) 10 MEQ tablet Take 10 mEq by mouth daily.     Artificial Tear Solution (SOOTHE XP OP) Place 1 drop into both eyes 3 (three) times daily as needed (dry eyes). (Patient not taking: Reported on 02/15/2023)      Buprenorphine HCl 300 MCG FILM Place 300 mcg inside cheek daily at 2 am. (Patient not taking: Reported on 05/17/2023)     escitalopram  (LEXAPRO ) 20 MG tablet 1  qam 30 tablet 5   hydrochlorothiazide  (HYDRODIURIL ) 25 MG tablet Take 25 mg by mouth daily. (Patient not taking: Reported on 06/02/2022)     LORazepam  (ATIVAN ) 0.5  MG tablet 1qam  2  qhs 90 tablet 5   ondansetron  (ZOFRAN -ODT) 4 MG disintegrating tablet Take 4 mg by mouth as needed for nausea or vomiting. (Patient not taking: Reported on 10/18/2023)     oxybutynin  (DITROPAN -XL) 5 MG 24 hr tablet Take 5 mg by mouth at bedtime. (Patient not taking: Reported on 10/18/2023)     oxyCODONE -acetaminophen  (PERCOCET/ROXICET) 5-325 MG tablet Take 1-2 tablets by mouth every 4 (four) hours as needed for severe pain. (Patient not taking: Reported on 06/02/2022) 30 tablet 0   promethazine  (PHENERGAN ) 25 MG tablet Take 25 mg by mouth every 8 (eight) hours as needed for nausea or vomiting.     No current facility-administered medications for this visit.     Musculoskeletal: Strength & Muscle Tone: within normal limits Gait & Station: normal Patient leans: N/A  Psychiatric Specialty Exam: ROS  Blood pressure (!) 178/76, pulse (!) 59, height 4' 11 (1.499 m), weight 89 lb (40.4 kg).Body mass index is 17.98 kg/m.  General Appearance: Casual and Fairly Groomed  Eye Contact:  Fair  Speech:  Clear and Coherent and Normal Rate  Volume:  Normal  Mood:  Anxious and Dysphoric  Affect:  Appropriate and Congruent  Thought Process:  Goal Directed and Descriptions of Associations: Intact  Orientation:  Full (Time, Place, and Person)  Thought Content: Logical   Suicidal Thoughts:  No  Homicidal Thoughts:  No  Memory:  Immediate;   Fair  Judgement:  Intact  Insight:  Shallow  Psychomotor Activity:  Normal  Concentration:  Attention Span: Fair  Recall:  [employer] of Knowledge: Fair  Language: Good  Akathisia:  Negative  Handed:  Right  AIMS (if  indicated): not done  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  ADL's:  Intact  Cognition: WNL  Sleep:  Good   Screenings: PHQ2-9    Flowsheet Row [facility]02/20[facility]  PHQ-2 Total Score 6  PHQ-9 Total Score 14   Flowsheet Row Admission (Discharged) from 12/15/2020 in [facility] ED from 07/15/2020 in [facility] Emergency Department at [facility] Pre-Admission Testing 60 from 07/07/2020 in [facility] PREADMISSION TESTING  C-SSRS RISK CATEGORY No Risk No Risk No Risk     Assessment and Plan:         This patient's diagnosis is major depression in remission.  She continues taking Lexapro  10 mg.  She also has an adjustment disorder with an anxious mood state.  She takes a small dose of Ativan  3 a day.  She will continue taking this.  The patient had no falls she is functioning very well.  She will return to see me in 5 months.   Status of current problems: unchanged  Labs Ordered: No orders of the defined types were placed in this encounter.   Labs Reviewed: NA  Collateral Obtained/Records Reviewed: NA  Plan:  Continue Xanax , Lexapro , Elavil  as prescribed RTC 3 MONTHS   Elna LILLETTE Lo, MD 10/18/2023, 3:11 PM

## 2024-03-13 ENCOUNTER — Ambulatory Visit (HOSPITAL_COMMUNITY): Admitting: Psychiatry
